# Patient Record
Sex: Female | Born: 1985
Health system: Southern US, Community
[De-identification: ages and names within clinical notes are randomized; demographics above are authoritative.]

## PROBLEM LIST (undated history)

## (undated) ENCOUNTER — Ambulatory Visit: Source: Home / Self Care

## (undated) ENCOUNTER — Ambulatory Visit (HOSPITAL_COMMUNITY): Source: Home / Self Care

## (undated) ENCOUNTER — Emergency Department (HOSPITAL_BASED_OUTPATIENT_CLINIC_OR_DEPARTMENT_OTHER): Admission: EM

## (undated) DIAGNOSIS — M24669 Ankylosis, unspecified knee: Secondary | ICD-10-CM

## (undated) DIAGNOSIS — S83209A Unspecified tear of unspecified meniscus, current injury, unspecified knee, initial encounter: Secondary | ICD-10-CM

## (undated) DIAGNOSIS — O24419 Gestational diabetes mellitus in pregnancy, unspecified control: Secondary | ICD-10-CM

## (undated) DIAGNOSIS — O139 Gestational [pregnancy-induced] hypertension without significant proteinuria, unspecified trimester: Secondary | ICD-10-CM

## (undated) DIAGNOSIS — Z789 Other specified health status: Secondary | ICD-10-CM

## (undated) HISTORY — DX: Gestational diabetes mellitus in pregnancy, unspecified control: O24.419

---

## 2015-08-13 ENCOUNTER — Emergency Department (HOSPITAL_COMMUNITY): Payer: Medicaid Other

## 2015-08-13 ENCOUNTER — Emergency Department (HOSPITAL_COMMUNITY)
Admission: EM | Admit: 2015-08-13 | Discharge: 2015-08-13 | Disposition: A | Discharge status: Home / Self Care | Payer: Medicaid Other | Attending: Emergency Medicine | Admitting: Emergency Medicine

## 2015-08-13 ENCOUNTER — Encounter (HOSPITAL_COMMUNITY): Payer: Self-pay | Admitting: *Deleted

## 2015-08-13 DIAGNOSIS — S86911A Strain of unspecified muscle(s) and tendon(s) at lower leg level, right leg, initial encounter: Secondary | ICD-10-CM | POA: Insufficient documentation

## 2015-08-13 DIAGNOSIS — W1830XA Fall on same level, unspecified, initial encounter: Secondary | ICD-10-CM | POA: Diagnosis not present

## 2015-08-13 DIAGNOSIS — S8991XA Unspecified injury of right lower leg, initial encounter: Secondary | ICD-10-CM | POA: Insufficient documentation

## 2015-08-13 DIAGNOSIS — Y999 Unspecified external cause status: Secondary | ICD-10-CM | POA: Insufficient documentation

## 2015-08-13 DIAGNOSIS — S8391XA Sprain of unspecified site of right knee, initial encounter: Secondary | ICD-10-CM

## 2015-08-13 DIAGNOSIS — Y929 Unspecified place or not applicable: Secondary | ICD-10-CM | POA: Insufficient documentation

## 2015-08-13 DIAGNOSIS — Y9372 Activity, wrestling: Secondary | ICD-10-CM | POA: Insufficient documentation

## 2015-08-13 MED ORDER — OXYCODONE-ACETAMINOPHEN 5-325 MG PO TABS
1.0000 | ORAL_TABLET | Freq: Once | ORAL | Status: AC
  Administered 2015-08-13: 1 via ORAL
  Filled 2015-08-13: qty 1

## 2015-08-13 MED ORDER — TRAMADOL HCL 50 MG PO TABS: 50.0000 mg | ORAL_TABLET | Freq: Four times a day (QID) | ORAL | Status: DC | PRN

## 2015-08-13 NOTE — ED Notes (Signed)
Patient in wheelchair, says that she cannot bend leg to get into bed.

## 2015-08-13 NOTE — ED Provider Notes (Signed)
CSN: [id number]     Arrival date & time 08/13/15  0203 History  By signing my name below, I, Colleen Wells, attest that this documentation has been prepared under the direction and in the presence of Gilda Creasehristopher J Pollina, MD. Electronically Signed: Angelene GiovanniEmmanuella Wells, ED Scribe. 08/13/2015. 2:25 AM.      Chief Complaint  Patient presents with  . Leg Injury   Patient is a 30 y.o. female presenting with knee pain. The history is provided by the patient. No language interpreter was used.  Knee Pain Location:  Knee Injury: yes   Mechanism of injury: fall   Fall:    Fall occurred:  Recreating/playing   Point of impact:  Knees   Entrapped after fall: no   Knee location:  R knee Pain details:    Radiates to:  Does not radiate   Severity:  Moderate   Onset quality:  Gradual   Timing:  Constant   Progression:  Worsening Chronicity:  New Foreign body present:  No foreign bodies Relieved by:  None tried Worsened by:  Nothing tried Ineffective treatments:  None tried Associated symptoms: decreased ROM   Associated symptoms: no fever, no muscle weakness, no numbness, no swelling and no tingling    HPI Comments: Colleen Wells is a 30 y.o. female who presents to the Emergency Department complaining of gradually worsening right knee pain s/p fall that occurred PTA. She explains that she was playing wrestling when she fell on her right knee. No LOC or head injuries. She reports associated difficulty ambulating secondary to pain. No alleviating factors noted. Pt has not tried any medications PTA. She denies any other injuries. No fever, numbness, weakness, or swelling to the knee.    History reviewed. No pertinent past medical history. Past Surgical History  Procedure Laterality Date  . Cesarean section     No family history on file. Social History  Substance Use Topics  . Smoking status: Never Smoker   . Smokeless tobacco: None  . Alcohol Use: No   OB History    No data available      Review of Systems  Constitutional: Negative for fever.  Musculoskeletal: Positive for arthralgias. Negative for joint swelling.  Neurological: Negative for weakness and numbness.  All other systems reviewed and are negative.     Allergies  Review of patient's allergies indicates no known allergies.  Home Medications   Prior to Admission medications   Not on File   BP 125/77 mmHg  Pulse 103  Temp(Src) 98.8 F (37.1 C) (Oral)  Resp 18  Ht 5\' 8"  (1.727 m)  Wt 180 lb (81.647 kg)  BMI 27.38 kg/m2  SpO2 99%  LMP 04/15/2015 Physical Exam  Constitutional: She is oriented to person, place, and time. She appears well-developed and well-nourished. No distress.  HENT:  Head: Normocephalic and atraumatic.  Right Ear: Hearing normal.  Left Ear: Hearing normal.  Nose: Nose normal.  Mouth/Throat: Oropharynx is clear and moist and mucous membranes are normal.  Eyes: Conjunctivae and EOM are normal. Pupils are equal, round, and reactive to light.  Neck: Normal range of motion. Neck supple.  Cardiovascular: Regular rhythm, S1 normal and S2 normal.  Exam reveals no gallop and no friction rub.   No murmur heard. Pulmonary/Chest: Effort normal and breath sounds normal. No respiratory distress. She exhibits no tenderness.  Abdominal: Soft. Normal appearance and bowel sounds are normal. There is no hepatosplenomegaly. There is no tenderness. There is no rebound, no guarding, no tenderness at  McBurney's point and negative Murphy's sign. No hernia.  Musculoskeletal: She exhibits tenderness.  Tenderness proximal tibia, no swelling, no joint effusion, no deformity. Decreased ROM secondary to pain  Neurological: She is alert and oriented to person, place, and time. She has normal strength. No cranial nerve deficit or sensory deficit. Coordination normal. GCS eye subscore is 4. GCS verbal subscore is 5. GCS motor subscore is 6.  Skin: Skin is warm, dry and intact. No rash noted. No cyanosis.   Psychiatric: She has a normal mood and affect. Her speech is normal and behavior is normal. Thought content normal.  Nursing note and vitals reviewed.   ED Course  Procedures (including critical care time) DIAGNOSTIC STUDIES: Oxygen Saturation is 99% on RA, normal by my interpretation.    COORDINATION OF CARE: 2:24 AM- Pt advised of plan for treatment and pt agrees.    Labs Review Labs Reviewed - No data to display  Imaging Review No results found.   Gilda Crease, MD has personally reviewed and evaluated these images and lab results as part of his medical decision-making.   EKG Interpretation None      MDM   Final diagnoses:  None  knee injury   Presents to the ER with complaints of knee injury. Patient reports that she fell, landing directly on her right knee. She indicates pain just below the kneecap. X-ray was negative. Cannot perform ligamentous examination because of painful inhibition. Will place an knee immobilizer, administer analgesia and have follow-up with orthopedics.  I personally performed the services described in this documentation, which was scribed in my presence. The recorded information has been reviewed and is accurate.    Gilda Crease, MD 08/13/15 [phone]

## 2015-08-13 NOTE — ED Notes (Signed)
Pt c/o right leg/knee pain since falling on it while play wrestling. Pt states that she cannot put weight on her right leg.

## 2015-08-13 NOTE — Discharge Instructions (Signed)

## 2015-08-16 ENCOUNTER — Other Ambulatory Visit: Payer: Self-pay | Admitting: Orthopedic Surgery

## 2015-08-16 DIAGNOSIS — M25561 Pain in right knee: Secondary | ICD-10-CM

## 2015-08-24 ENCOUNTER — Ambulatory Visit
Admission: RE | Admit: 2015-08-24 | Discharge: 2015-08-24 | Disposition: A | Discharge status: Home / Self Care | Payer: Medicaid Other | Source: Ambulatory Visit | Attending: Orthopedic Surgery | Admitting: Orthopedic Surgery

## 2015-08-24 DIAGNOSIS — M25561 Pain in right knee: Secondary | ICD-10-CM

## 2015-08-28 ENCOUNTER — Other Ambulatory Visit: Payer: Self-pay | Admitting: Orthopedic Surgery

## 2015-09-02 ENCOUNTER — Encounter (HOSPITAL_COMMUNITY): Payer: Self-pay | Admitting: *Deleted

## 2015-09-02 MED ORDER — CHLORHEXIDINE GLUCONATE 4 % EX LIQD: 60.0000 mL | Freq: Once | CUTANEOUS | Status: DC

## 2015-09-02 MED ORDER — CEFAZOLIN SODIUM-DEXTROSE 2-4 GM/100ML-% IV SOLN
2.0000 g | INTRAVENOUS | Status: AC
  Administered 2015-09-03: 2 g via INTRAVENOUS
  Filled 2015-09-02: qty 100

## 2015-09-03 ENCOUNTER — Encounter (HOSPITAL_COMMUNITY)
Admission: RE | Disposition: A | Discharge status: Home / Self Care | Payer: Self-pay | Source: Ambulatory Visit | Attending: Orthopedic Surgery

## 2015-09-03 ENCOUNTER — Ambulatory Visit (HOSPITAL_COMMUNITY)
Admission: RE | Admit: 2015-09-03 | Discharge: 2015-09-03 | Disposition: A | Discharge status: Home / Self Care | Payer: Medicaid Other | Source: Ambulatory Visit | Attending: Orthopedic Surgery | Admitting: Orthopedic Surgery

## 2015-09-03 ENCOUNTER — Encounter (HOSPITAL_COMMUNITY): Payer: Self-pay | Admitting: *Deleted

## 2015-09-03 ENCOUNTER — Ambulatory Visit (HOSPITAL_COMMUNITY): Payer: Medicaid Other | Admitting: Certified Registered"

## 2015-09-03 DIAGNOSIS — S83251A Bucket-handle tear of lateral meniscus, current injury, right knee, initial encounter: Secondary | ICD-10-CM | POA: Insufficient documentation

## 2015-09-03 DIAGNOSIS — Y9372 Activity, wrestling: Secondary | ICD-10-CM | POA: Diagnosis not present

## 2015-09-03 DIAGNOSIS — Y929 Unspecified place or not applicable: Secondary | ICD-10-CM | POA: Insufficient documentation

## 2015-09-03 DIAGNOSIS — S83511A Sprain of anterior cruciate ligament of right knee, initial encounter: Secondary | ICD-10-CM | POA: Insufficient documentation

## 2015-09-03 DIAGNOSIS — M2550 Pain in unspecified joint: Secondary | ICD-10-CM | POA: Diagnosis not present

## 2015-09-03 DIAGNOSIS — M23200 Derangement of unspecified lateral meniscus due to old tear or injury, right knee: Secondary | ICD-10-CM | POA: Diagnosis present

## 2015-09-03 HISTORY — DX: Other specified health status: Z78.9

## 2015-09-03 HISTORY — PX: MENISCUS REPAIR: SHX5179

## 2015-09-03 HISTORY — DX: Unspecified tear of unspecified meniscus, current injury, unspecified knee, initial encounter: S83.209A

## 2015-09-03 HISTORY — PX: ANTERIOR CRUCIATE LIGAMENT REPAIR: SHX115

## 2015-09-03 LAB — CBC
HCT: 39.5 % (ref 36.0–46.0)
HEMOGLOBIN: 13.2 g/dL (ref 12.0–15.0)
MCH: 28.1 pg (ref 26.0–34.0)
MCHC: 33.4 g/dL (ref 30.0–36.0)
MCV: 84 fL (ref 78.0–100.0)
PLATELETS: 294 10*3/uL (ref 150–400)
RBC: 4.7 MIL/uL (ref 3.87–5.11)
RDW: 12.5 % (ref 11.5–15.5)
WBC: 9.6 10*3/uL (ref 4.0–10.5)

## 2015-09-03 LAB — HCG, SERUM, QUALITATIVE: PREG SERUM: NEGATIVE

## 2015-09-03 SURGERY — RECONSTRUCTION, KNEE, ACL, USING HAMSTRING GRAFT
Anesthesia: Regional | Site: Knee | Laterality: Right

## 2015-09-03 MED ORDER — FENTANYL CITRATE (PF) 100 MCG/2ML IJ SOLN
INTRAMUSCULAR | Status: AC
  Administered 2015-09-03: 75 ug via INTRAVENOUS
  Filled 2015-09-03: qty 2

## 2015-09-03 MED ORDER — OXYCODONE HCL 5 MG/5ML PO SOLN: 5.0000 mg | Freq: Once | ORAL | Status: DC | PRN

## 2015-09-03 MED ORDER — METHOCARBAMOL 500 MG PO TABS: 500.0000 mg | ORAL_TABLET | Freq: Four times a day (QID) | ORAL | Status: DC

## 2015-09-03 MED ORDER — SODIUM CHLORIDE 0.9 % IR SOLN
Status: DC | PRN
  Administered 2015-09-03 (×6): 3000 mL

## 2015-09-03 MED ORDER — PROPOFOL 10 MG/ML IV BOLUS
INTRAVENOUS | Status: AC
  Filled 2015-09-03: qty 20

## 2015-09-03 MED ORDER — BUPIVACAINE HCL (PF) 0.25 % IJ SOLN
INTRAMUSCULAR | Status: AC
  Filled 2015-09-03: qty 30

## 2015-09-03 MED ORDER — BUPIVACAINE-EPINEPHRINE (PF) 0.5% -1:200000 IJ SOLN
INTRAMUSCULAR | Status: DC | PRN
  Administered 2015-09-03: 20 mL via PERINEURAL

## 2015-09-03 MED ORDER — PROPOFOL 10 MG/ML IV BOLUS
INTRAVENOUS | Status: DC | PRN
  Administered 2015-09-03: 200 mg via INTRAVENOUS
  Administered 2015-09-03: 20 mg via INTRAVENOUS

## 2015-09-03 MED ORDER — LACTATED RINGERS IV SOLN
INTRAVENOUS | Status: DC | PRN
  Administered 2015-09-03 (×2): via INTRAVENOUS

## 2015-09-03 MED ORDER — FENTANYL CITRATE (PF) 250 MCG/5ML IJ SOLN
INTRAMUSCULAR | Status: AC
  Filled 2015-09-03: qty 5

## 2015-09-03 MED ORDER — MORPHINE SULFATE (PF) 4 MG/ML IV SOLN
INTRAVENOUS | Status: DC | PRN
  Administered 2015-09-03: 8 mg via INTRAVENOUS

## 2015-09-03 MED ORDER — LIDOCAINE HCL (CARDIAC) 20 MG/ML IV SOLN
INTRAVENOUS | Status: DC | PRN
  Administered 2015-09-03: 80 mg via INTRAVENOUS

## 2015-09-03 MED ORDER — MIDAZOLAM HCL 2 MG/2ML IJ SOLN
2.0000 mg | Freq: Once | INTRAMUSCULAR | Status: AC
Start: 2015-09-03 — End: 2015-09-03
  Administered 2015-09-03: 2 mg via INTRAVENOUS

## 2015-09-03 MED ORDER — ONDANSETRON HCL 4 MG/2ML IJ SOLN
INTRAMUSCULAR | Status: DC | PRN
Start: 2015-09-03 — End: 2015-09-03
  Administered 2015-09-03: 4 mg via INTRAVENOUS

## 2015-09-03 MED ORDER — MIDAZOLAM HCL 2 MG/2ML IJ SOLN
INTRAMUSCULAR | Status: AC
  Administered 2015-09-03: 2 mg via INTRAVENOUS
  Filled 2015-09-03: qty 2

## 2015-09-03 MED ORDER — OXYCODONE HCL 5 MG PO TABS: 5.0000 mg | ORAL_TABLET | Freq: Once | ORAL | Status: DC | PRN

## 2015-09-03 MED ORDER — ASPIRIN EC 325 MG PO TBEC: 325.0000 mg | DELAYED_RELEASE_TABLET | Freq: Every day | ORAL | Status: DC

## 2015-09-03 MED ORDER — MORPHINE SULFATE (PF) 4 MG/ML IV SOLN
INTRAVENOUS | Status: AC
  Filled 2015-09-03: qty 1

## 2015-09-03 MED ORDER — FENTANYL CITRATE (PF) 100 MCG/2ML IJ SOLN
75.0000 ug | Freq: Once | INTRAMUSCULAR | Status: AC
  Administered 2015-09-03: 75 ug via INTRAVENOUS

## 2015-09-03 MED ORDER — FENTANYL CITRATE (PF) 100 MCG/2ML IJ SOLN
INTRAMUSCULAR | Status: DC | PRN
  Administered 2015-09-03: 50 ug via INTRAVENOUS
  Administered 2015-09-03: 25 ug via INTRAVENOUS
  Administered 2015-09-03 (×2): 50 ug via INTRAVENOUS
  Administered 2015-09-03 (×2): 25 ug via INTRAVENOUS
  Administered 2015-09-03: 50 ug via INTRAVENOUS
  Administered 2015-09-03 (×3): 25 ug via INTRAVENOUS

## 2015-09-03 MED ORDER — HYDROMORPHONE HCL 1 MG/ML IJ SOLN: 0.2500 mg | INTRAMUSCULAR | Status: DC | PRN

## 2015-09-03 MED ORDER — PROMETHAZINE HCL 25 MG/ML IJ SOLN: 6.2500 mg | INTRAMUSCULAR | Status: DC | PRN

## 2015-09-03 MED ORDER — ONDANSETRON HCL 4 MG/2ML IJ SOLN
INTRAMUSCULAR | Status: AC
  Filled 2015-09-03: qty 2

## 2015-09-03 MED ORDER — OXYCODONE-ACETAMINOPHEN 5-325 MG PO TABS: 1.0000 | ORAL_TABLET | Freq: Four times a day (QID) | ORAL | Status: DC | PRN

## 2015-09-03 MED ORDER — LACTATED RINGERS IV SOLN
Freq: Once | INTRAVENOUS | Status: AC
  Administered 2015-09-03: 09:00:00 via INTRAVENOUS

## 2015-09-03 MED ORDER — BUPIVACAINE HCL (PF) 0.25 % IJ SOLN
INTRAMUSCULAR | Status: DC | PRN
Start: 2015-09-03 — End: 2015-09-03
  Administered 2015-09-03: 10 mL

## 2015-09-03 MED ORDER — DEXAMETHASONE SODIUM PHOSPHATE 10 MG/ML IJ SOLN
INTRAMUSCULAR | Status: DC | PRN
  Administered 2015-09-03: 5 mg via INTRAVENOUS

## 2015-09-03 MED ORDER — CLONIDINE HCL (ANALGESIA) 100 MCG/ML EP SOLN
EPIDURAL | Status: DC | PRN
Start: 2015-09-03 — End: 2015-09-03
  Administered 2015-09-03: 1 mL

## 2015-09-03 MED ORDER — MIDAZOLAM HCL 2 MG/2ML IJ SOLN
INTRAMUSCULAR | Status: AC
  Filled 2015-09-03: qty 2

## 2015-09-03 MED ORDER — 0.9 % SODIUM CHLORIDE (POUR BTL) OPTIME
TOPICAL | Status: DC | PRN
  Administered 2015-09-03: 1000 mL

## 2015-09-03 MED ORDER — LIDOCAINE 2% (20 MG/ML) 5 ML SYRINGE
INTRAMUSCULAR | Status: AC
  Filled 2015-09-03: qty 5

## 2015-09-03 SURGICAL SUPPLY — 92 items
ANCHOR PUSHLOCK PEEK 3.5X19.5 (Anchor) ×3 IMPLANT
BANDAGE ELASTIC 6 VELCRO ST LF (GAUZE/BANDAGES/DRESSINGS) ×3 IMPLANT
BANDAGE ESMARK 6X9 LF (GAUZE/BANDAGES/DRESSINGS) ×1 IMPLANT
BLADE CUTTER GATOR 3.5 (BLADE) ×3 IMPLANT
BLADE GREAT WHITE 4.2 (BLADE) ×2 IMPLANT
BLADE GREAT WHITE 4.2MM (BLADE) ×1
BLADE SURG 10 STRL SS (BLADE) ×3 IMPLANT
BLADE SURG 15 STRL LF DISP TIS (BLADE) ×4 IMPLANT
BLADE SURG 15 STRL SS (BLADE) ×8
BNDG ELASTIC 6X15 VLCR STRL LF (GAUZE/BANDAGES/DRESSINGS) ×3 IMPLANT
BNDG ESMARK 6X9 LF (GAUZE/BANDAGES/DRESSINGS) ×3
BUR OVAL 6.0 (BURR) ×3 IMPLANT
CANNULA 5.75X7 CRYSTAL CLEAR (CANNULA) ×3 IMPLANT
CANNULA 5.75X71 LONG (CANNULA) ×3 IMPLANT
CLOSURE WOUND 1/2 X4 (GAUZE/BANDAGES/DRESSINGS) ×1
COVER MAYO STAND STRL (DRAPES) IMPLANT
COVER SURGICAL LIGHT HANDLE (MISCELLANEOUS) ×3 IMPLANT
CUFF TOURNIQUET SINGLE 34IN LL (TOURNIQUET CUFF) ×3 IMPLANT
DECANTER SPIKE VIAL GLASS SM (MISCELLANEOUS) ×3 IMPLANT
DRAPE ARTHROSCOPY W/POUCH 114 (DRAPES) ×3 IMPLANT
DRAPE INCISE IOBAN 66X45 STRL (DRAPES) ×3 IMPLANT
DRAPE U-SHAPE 47X51 STRL (DRAPES) ×3 IMPLANT
DRILL FLIPCUTTER II 7.5MM (MISCELLANEOUS) IMPLANT
DRILL FLIPCUTTER II 8.0MM (INSTRUMENTS) IMPLANT
DRILL FLIPCUTTER II 8.5MM (INSTRUMENTS) IMPLANT
DRILL FLIPCUTTER II 9.0MM (INSTRUMENTS) IMPLANT
DRSG PAD ABDOMINAL 8X10 ST (GAUZE/BANDAGES/DRESSINGS) ×3 IMPLANT
DRSG TEGADERM 2-3/8X2-3/4 SM (GAUZE/BANDAGES/DRESSINGS) ×3 IMPLANT
DRSG TEGADERM 4X4.75 (GAUZE/BANDAGES/DRESSINGS) ×9 IMPLANT
DURAPREP 26ML APPLICATOR (WOUND CARE) ×6 IMPLANT
ELECT REM PT RETURN 9FT ADLT (ELECTROSURGICAL) ×3
ELECTRODE REM PT RTRN 9FT ADLT (ELECTROSURGICAL) ×1 IMPLANT
FIBERSTICK 2 (SUTURE) ×6 IMPLANT
FLIPCUTTER II 7.5MM (MISCELLANEOUS)
FLIPCUTTER II 8.0MM (INSTRUMENTS)
FLIPCUTTER II 8.5MM (INSTRUMENTS)
FLIPCUTTER II 9.0MM (INSTRUMENTS)
FLUID NSS /IRRIG 3000 ML XXX (IV SOLUTION) ×18 IMPLANT
GAUZE SPONGE 4X4 12PLY STRL (GAUZE/BANDAGES/DRESSINGS) ×3 IMPLANT
GAUZE XEROFORM 1X8 LF (GAUZE/BANDAGES/DRESSINGS) ×3 IMPLANT
GLOVE BIOGEL PI IND STRL 8 (GLOVE) ×2 IMPLANT
GLOVE BIOGEL PI INDICATOR 8 (GLOVE) ×4
GLOVE ORTHO TXT STRL SZ7.5 (GLOVE) ×3 IMPLANT
GLOVE SURG ORTHO 8.0 STRL STRW (GLOVE) ×3 IMPLANT
GOWN SPEC L3 XXLG W/TWL (GOWN DISPOSABLE) ×3 IMPLANT
GOWN STRL REUS W/ TWL LRG LVL3 (GOWN DISPOSABLE) ×2 IMPLANT
GOWN STRL REUS W/TWL LRG LVL3 (GOWN DISPOSABLE) ×4
IMMOBILIZER KNEE 22 UNIV (SOFTGOODS) ×3 IMPLANT
KIT BASIN OR (CUSTOM PROCEDURE TRAY) ×3 IMPLANT
KIT BIOCARTILAGE DEL W/SYRINGE (KITS) IMPLANT
KIT ROOM TURNOVER OR (KITS) ×3 IMPLANT
MANIFOLD NEPTUNE II (INSTRUMENTS) ×3 IMPLANT
NEEDLE 18GX1X1/2 (RX/OR ONLY) (NEEDLE) ×3 IMPLANT
NEEDLE SCORPION MULTI FIRE (NEEDLE) ×3 IMPLANT
NS IRRIG 1000ML POUR BTL (IV SOLUTION) ×3 IMPLANT
PACK ARTHROSCOPY DSU (CUSTOM PROCEDURE TRAY) ×3 IMPLANT
PAD ARMBOARD 7.5X6 YLW CONV (MISCELLANEOUS) ×6 IMPLANT
PAD CAST 4YDX4 CTTN HI CHSV (CAST SUPPLIES) ×1 IMPLANT
PADDING CAST ABS 6INX4YD NS (CAST SUPPLIES) ×2
PADDING CAST ABS COTTON 6X4 NS (CAST SUPPLIES) ×1 IMPLANT
PADDING CAST COTTON 4X4 STRL (CAST SUPPLIES) ×2
PADDING CAST COTTON 6X4 STRL (CAST SUPPLIES) ×9 IMPLANT
PENCIL BUTTON HOLSTER BLD 10FT (ELECTRODE) ×3 IMPLANT
SET ARTHROSCOPY TUBING (MISCELLANEOUS) ×2
SET ARTHROSCOPY TUBING LN (MISCELLANEOUS) ×1 IMPLANT
SPONGE GAUZE 4X4 12PLY STER LF (GAUZE/BANDAGES/DRESSINGS) ×3 IMPLANT
SPONGE LAP 4X18 X RAY DECT (DISPOSABLE) ×6 IMPLANT
SPONGE SCRUB IODOPHOR (GAUZE/BANDAGES/DRESSINGS) ×3 IMPLANT
STRIP CLOSURE SKIN 1/2X4 (GAUZE/BANDAGES/DRESSINGS) ×2 IMPLANT
SUCTION FRAZIER HANDLE 10FR (MISCELLANEOUS) ×2
SUCTION TUBE FRAZIER 10FR DISP (MISCELLANEOUS) ×1 IMPLANT
SUT ETHILON 3 0 PS 1 (SUTURE) ×9 IMPLANT
SUT FIBERWIRE #2 38 REV NDL BL (SUTURE) ×6
SUT FIBERWIRE 2-0 18 17.9 3/8 (SUTURE) ×15
SUT MENISCAL KIT (KITS) ×3 IMPLANT
SUT MNCRL AB 3-0 PS2 18 (SUTURE) ×6 IMPLANT
SUT VIC AB 0 CT1 27 (SUTURE) ×4
SUT VIC AB 0 CT1 27XBRD ANBCTR (SUTURE) ×2 IMPLANT
SUT VIC AB 2-0 CT1 27 (SUTURE)
SUT VIC AB 2-0 CT1 TAPERPNT 27 (SUTURE) IMPLANT
SUT VICRYL 0 UR6 27IN ABS (SUTURE) ×6 IMPLANT
SUTURE FIBERWR 2-0 18 17.9 3/8 (SUTURE) ×5 IMPLANT
SUTURE FIBERWR#2 38 REV NDL BL (SUTURE) ×2 IMPLANT
SYR 30ML LL (SYRINGE) ×3 IMPLANT
SYR BULB IRRIGATION 50ML (SYRINGE) ×3 IMPLANT
SYR TB 1ML LUER SLIP (SYRINGE) ×3 IMPLANT
TOWEL OR 17X24 6PK STRL BLUE (TOWEL DISPOSABLE) ×3 IMPLANT
TOWEL OR 17X26 10 PK STRL BLUE (TOWEL DISPOSABLE) ×3 IMPLANT
UNDERPAD 30X30 INCONTINENT (UNDERPADS AND DIAPERS) ×3 IMPLANT
WAND 30 DEG SABER W/CORD (SURGICAL WAND) IMPLANT
WAND HAND CNTRL MULTIVAC 90 (MISCELLANEOUS) ×3 IMPLANT
WRAP KNEE MAXI GEL POST OP (GAUZE/BANDAGES/DRESSINGS) ×3 IMPLANT

## 2015-09-03 NOTE — Anesthesia Procedure Notes (Addendum)
Anesthesia Regional Block:  Femoral nerve block  Pre-Anesthetic Checklist: ,, timeout performed, Correct Patient, Correct Site, Correct Laterality, Correct Procedure, Correct Position, site marked, Risks and benefits discussed,  Surgical consent,  Pre-op evaluation,  At surgeon's request and post-op pain management  Laterality: Right  Prep: Maximum Sterile Barrier Precautions used and chloraprep       Needles:  Injection technique: Single-shot  Needle Type: Stimiplex     Needle Length: 9cm 9 cm Needle Gauge: 21 and 21 G    Additional Needles:  Procedures: ultrasound guided (picture in chart) Femoral nerve block Narrative:  Injection made incrementally with aspirations every 5 mL.  Performed by: Personally  Anesthesiologist: JUDD, BENJAMIN  Additional Notes: Patient tolerated the procedure well without complications. No parasthesia.    Procedure Name: LMA Insertion Performed by: Fransisca KaufmannMEYER, Garner Dullea E Pre-anesthesia Checklist: Patient identified, Emergency Drugs available, Suction available and Patient being monitored Patient Re-evaluated:Patient Re-evaluated prior to inductionOxygen Delivery Method: Circle System Utilized Preoxygenation: Pre-oxygenation with 100% oxygen Intubation Type: IV induction Ventilation: Mask ventilation without difficulty LMA: LMA inserted LMA Size: 4.0 Number of attempts: 1 Airway Equipment and Method: Bite block Placement Confirmation: positive ETCO2 and breath sounds checked- equal and bilateral Tube secured with: Tape Dental Injury: Teeth and Oropharynx as per pre-operative assessment

## 2015-09-03 NOTE — Progress Notes (Signed)
Orthopedic Tech Progress Note Patient Details:  Temple PaciniShguna Monsivais 03-13-1985 [id number]  Ortho Devices Ortho Device/Splint Location: footsie roll Ortho Device/Splint Interventions: Ordered, Application   Jennye MoccasinHughes, May Manrique Craig 09/03/2015, 5:56 PM

## 2015-09-03 NOTE — Brief Op Note (Signed)
09/03/2015  4:37 PM  PATIENT:  Colleen Wells  30 y.o. female  PRE-OPERATIVE DIAGNOSIS:  RIGHT KNEE ANTERIOR CRUCIATE LIGAMENT AVULSION,  LATERAL MENISCAL TEARS  POST-OPERATIVE DIAGNOSIS:  RIGHT KNEE ANTERIOR CRUCIATE LIGAMENT avulsion, lateral bucket handle meniscal tear  PROCEDURE:  Procedure(s): REPAIR ANTERIOR CRUCIATE LIGAMENT  REPAIR OF AVULSION REPAIR OF MENISCUS LATERAL  SURGEON:  Surgeon(s): Cammy CopaScott Kyros Salzwedel, MD  ASSISTANT: April Green RNFA  ANESTHESIA:   general  EBL: 15 ml    Total I/O In: 1800 [I.V.:1800] Out: 25 [Blood:25]  BLOOD ADMINISTERED: none  DRAINS: none   LOCAL MEDICATIONS USED:    SPECIMEN:  No Specimen  COUNTS:  YES  TOURNIQUET:   Total Tourniquet Time Documented: Thigh (Right) - 112 minutes Total: Thigh (Right) - 112 minutes   DICTATION: .Other Dictation: Dictation Number done  PLAN OF CARE: Discharge to home after PACU  PATIENT DISPOSITION:  PACU - hemodynamically stable

## 2015-09-03 NOTE — Transfer of Care (Signed)
Immediate Anesthesia Transfer of Care Note  Patient: Colleen Wells  Procedure(s) Performed: Procedure(s): REPAIR ANTERIOR CRUCIATE LIGAMENT  REPAIR OF AVULSION (Right) REPAIR OF MENISCUS LATERAL (Right)  Patient Location: PACU  Anesthesia Type:General  Level of Consciousness: awake, alert , oriented and patient cooperative  Airway & Oxygen Therapy: Patient Spontanous Breathing  Post-op Assessment: Report given to RN and Post -op Vital signs reviewed and stable  Post vital signs: Reviewed and stable  Last Vitals:  Filed Vitals:   09/03/15 0955 09/03/15 1000  BP: 131/63 123/67  Pulse: 89 91  Temp:    Resp: 20 18    Last Pain:  Filed Vitals:   09/03/15 1002  PainSc: 0-No pain         Complications: No apparent anesthesia complications

## 2015-09-03 NOTE — Anesthesia Preprocedure Evaluation (Signed)
Anesthesia Evaluation  Patient identified by MRN, date of birth, ID band Patient awake    Reviewed: Allergy & Precautions, H&P , NPO status , Patient's Chart, lab work & pertinent test results  History of Anesthesia Complications Negative for: history of anesthetic complications  Airway Mallampati: II  TM Distance: >3 FB Neck ROM: full    Dental no notable dental hx.    Pulmonary neg pulmonary ROS,    Pulmonary exam normal breath sounds clear to auscultation       Cardiovascular negative cardio ROS Normal cardiovascular exam Rhythm:regular Rate:Normal     Neuro/Psych negative neurological ROS     GI/Hepatic negative GI ROS, Neg liver ROS,   Endo/Other  negative endocrine ROS  Renal/GU negative Renal ROS     Musculoskeletal   Abdominal   Peds  Hematology negative hematology ROS (+)   Anesthesia Other Findings   Reproductive/Obstetrics negative OB ROS                             Anesthesia Physical Anesthesia Plan  ASA: II  Anesthesia Plan: General and Regional   Post-op Pain Management: GA combined w/ Regional for post-op pain   Induction: Intravenous  Airway Management Planned: LMA  Additional Equipment:   Intra-op Plan:   Post-operative Plan:   Informed Consent: I have reviewed the patients History and Physical, chart, labs and discussed the procedure including the risks, benefits and alternatives for the proposed anesthesia with the patient or authorized representative who has indicated his/her understanding and acceptance.   Dental Advisory Given  Plan Discussed with: Anesthesiologist, CRNA and Surgeon  Anesthesia Plan Comments:         Anesthesia Quick Evaluation

## 2015-09-03 NOTE — Anesthesia Postprocedure Evaluation (Signed)
Anesthesia Post Note  Patient: Colleen Wells  Procedure(s) Performed: Procedure(s) (LRB): REPAIR ANTERIOR CRUCIATE LIGAMENT  REPAIR OF AVULSION (Right) REPAIR OF MENISCUS LATERAL (Right)  Patient location during evaluation: PACU Anesthesia Type: General and Regional Level of consciousness: awake and alert Pain management: pain level controlled Vital Signs Assessment: post-procedure vital signs reviewed and stable Respiratory status: spontaneous breathing, nonlabored ventilation, respiratory function stable and patient connected to nasal cannula oxygen Cardiovascular status: blood pressure returned to baseline and stable Postop Assessment: no signs of nausea or vomiting Anesthetic complications: no    Last Vitals:  Filed Vitals:   09/03/15 1715 09/03/15 1718  BP:  122/72  Pulse: 77 78  Temp:    Resp: 14 14    Last Pain:  Filed Vitals:   09/03/15 1729  PainSc: Asleep                 Reino KentJudd, Rilie Glanz J

## 2015-09-03 NOTE — H&P (Signed)
Colleen Wells is an 30 y.o. female.   Chief Complaint: Right knee pain and locking knee HPI: Colleen Wells is a 30 year old female who was wrestling approximately 3 weeks ago when Colleen Wells sustained a right knee injury.  Colleen Wells describes inability to fully extend the knee.  Colleen Wells denies any other orthopedic complaints.  No family history of DVT or pulmonary embolism  Past Medical History  Diagnosis Date  . Meniscus tear   . Medical history non-contributory     Past Surgical History  Procedure Laterality Date  . Cesarean section      History reviewed. No pertinent family history. Social History:  reports that Colleen Wells has never smoked. Colleen Wells has never used smokeless tobacco. Colleen Wells reports that Colleen Wells does not drink alcohol or use illicit drugs.  Allergies: No Known Allergies  No prescriptions prior to admission    No results found for this or any previous visit (from the past 48 hour(s)). No results found.  Review of Systems  Constitutional: Negative.   HENT: Negative.   Respiratory: Negative.   Cardiovascular: Negative.   Gastrointestinal: Negative.   Genitourinary: Negative.   Musculoskeletal: Positive for joint pain.  Skin: Negative.   Neurological: Negative.   Endo/Heme/Allergies: Negative.   Psychiatric/Behavioral: Negative.     Height 5\' 8"  (1.727 m), weight 83.462 kg (184 lb), last menstrual period 04/15/2015. Physical Exam  Constitutional: Colleen Wells appears well-developed.  HENT:  Head: Normocephalic.  Eyes: Pupils are equal, round, and reactive to light.  Neck: Normal range of motion.  Cardiovascular: Normal rate.   Respiratory: Effort normal.  Neurological: Colleen Wells is alert.  Skin: Skin is warm.  Psychiatric: Colleen Wells has a normal mood and affect.   examination the right knee demonstrates lack of full extension by about 20.  Painful on the medial and lateral joint line.  Anterior cruciate ligament laxity is present.  No posterior lateral rotatory instability is present.  Pedal pulses palpable ankle  dorsi and plantar flexion intact  Assessment/Plan Impression anterior cruciate ligament avulsion fracture with both medial and lateral meniscal tears.  Lateral meniscal tear is bucket-handle tear displaced anteriorly which is blocking full extension.  Plan is arthroscopy with attempt at anterior cruciate ligament avulsion fracture repair with meniscal repair inside out performed as well.  If that avulsion fracture of the tibial spine cannot be repaired and anterior cruciate ligament reconstruction will be performed with hamstring autograft.  Risks and benefits of surgical procedure discussed with the patient including not limited to infection or vessel damage potential need for more surgery as well as the stiffness as well as residual laxity.  In regards to the meniscal repair and potential for numbness as well as a nerve vessel damage discussed all questions answered  Cammy CopaEAN,GREGORY SCOTT, MD 09/03/2015, 7:20 AM

## 2015-09-04 ENCOUNTER — Encounter (HOSPITAL_COMMUNITY): Payer: Self-pay | Admitting: Orthopedic Surgery

## 2015-09-04 NOTE — Op Note (Signed)
NAMTemple Wells:  Colleen Wells, Colleen Wells            ACCOUNT NO.:  [id number]  MEDICAL RECORD NO.:  [id number]  LOCATION:  [facility]                         FACILITY:  [facility]  PHYSICIAN:  Burnard BuntingG. Scott Dean, M.D.    DATE OF BIRTH:  Apr 28, 1985  DATE OF PROCEDURE: DATE OF DISCHARGE:  09/03/2015                              OPERATIVE REPORT   PREOPERATIVE DIAGNOSES:  Right knee anterior cruciate ligament avulsion fracture with medial and lateral meniscal tear, lateral meniscal tear being bucket handle type.  POSTOPERATIVE DIAGNOSES:  Right knee anterior cruciate ligament avulsion with intact anterior cruciate ligament avulsion off on the tibial plateau with some bone fragments and displaced bucket-handle tear of the lateral meniscus.  Medial meniscus intact.  PROCEDURE:  Right knee arthroscopy, ACL avulsion repair, and inside-out lateral meniscal repair, bucket-handle type using 7 alternating vertical mattress and horizontal mattress sutures.  SURGEON:  Burnard BuntingG. Scott Dean, M.D.  ASSISTANT:  April Chilton SiGreen, RNFA.  ANESTHESIA:  General.  INDICATIONS:  Colleen Wells is a patient who injured her knee during wrestling. She presents now for operative management after explanation risks benefits.  OPERATIVE FINDINGS: 1. Examination under anesthesia, range of motion.  The patient lacked     about 10 degrees of full extension, but did have full extension at     the conclusion of the case.  She had full flexion, good stability     to varus valgus stress at 0 and 30 degrees.  There was no     posterolateral rotatory instability noted.  She did have ACL     laxity. 2. Diagnostic and operative arthroscopy.     a.     Intact patellofemoral compartment.     b.     Bucket-handle tear of the lateral meniscus flipped into the      anterior joint.     c.     ACL avulsion fracture with comminution fracture of the      tibial spine and base.     d.     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     e.      Intact medial meniscus and articular cartilage.  PROCEDURE IN DETAIL:  The patient was brought to the operating room, where general anesthetic was induced.  Preoperative antibiotics were administered.  Time-out was called.  The patient was placed in a leg holder and the right leg was examined under anesthesia, and then prescrubbed with alcohol and Betadine which was allowed to air dry. Prepped with DuraPrep solution and draped in sterile manner.Colleen Wells.  Ioban was used to cover the operative field.  Time-out called.  Tourniquet elevated for a total about 150 minutes.  Anterior, inferior, and lateral port was created carefully.  Anterior, inferior, and medial port was created under direct visualization.  Diagnostic arthroscopy was performed.  Debridement was performed in order to achieve visualization anteriorly. The patient did have an ACL avulsion fracture.  The piece was flipped up into the joint.  She also had a very difficult to reduce bucket-handle tear of the lateral meniscus.  This extended about 2-3 cm from the mid portion extending back towards the posterior horn.  The medial meniscus  was intact.  Definite extensive probing demonstrated no tear that reached the inferior or superior articular surface.  The PCL was intact.  At this time, accessory transpatellar tendon portal was created.  Debridement was performed on the base of the ACL avulsion. Using an accessory mid patellar medial portal and the scorpion #2 FiberWire sutures were placed in mattress fashion x2 through the stump of the ACL, and guide pin was used to drill just slightly anterior to the equator of the crater, which was created from the ACL avulsion.  Two sutures were then passed and then tied over a bone bridge with the knee in extension.  This gave a very good restoration of ACL isometry as well as stability.  Loose bone fragments which were not incorporated were debrided.  At this time, the ACL repair was then tied over  a bone bridge with supplemental fixation with PushLock.  This gave a very secure repair, and had excellent range of motion.  Flexion and extension with good stability.  After this maneuver as well as reduction of the lateral meniscus, the knee did achieve full extension and attention was then directed towards the lateral side.  Using the arthroscopic rasp and the shaver, the meniscus was prepared along with the capsule.  Then, an exposure was made at the level of the joint line posterior laterally. Care being taken to avoid injury to the peroneal nerve in the lateral collateral ligament.  The tunnel the iliotibial band was split and dissection was performed between the lateral gastroc muscle belly extending proximally to its attachment posteriorly. Seven alternating vertical and horizontal mattress sutures were then placed using the inside-out Arthrex cannula.  This gave a very good suture fixation. Good repair was achieved.  At this time, thorough irrigation was performed and the sutures were tied posterior to anterior with the knee in about 30 degrees of extension.  This gave excellent secure repair. Tourniquet was released prior to the repair and this did not impede visualization.  The knee joint was thoroughly irrigated.  All incisions were thoroughly irrigated.  Posterolateral incision was closed using 0 Vicryl suture followed by 2-0 Vicryl and 3-0 Monocryl.  The portal incisions were closed using 2-0 Vicryl and 3-0 nylon.  Solution of Marcaine, morphine, and clonidine injected into the knee.  The knee was placed in a bulky dressing after impervious dressings were placed and a bulky dressing and ice pack and knee immobilizer.  Tolerated the procedure well without immediate complication.  Transferred to recovery room in stable condition.     Burnard Bunting, M.D.     GSD/MEDQ  D:  09/03/2015  T:  09/04/2015  Job:  [id number]

## 2015-09-18 ENCOUNTER — Ambulatory Visit: Payer: Medicaid Other | Attending: Orthopedic Surgery | Admitting: Physical Therapy

## 2015-09-18 DIAGNOSIS — M25561 Pain in right knee: Secondary | ICD-10-CM | POA: Diagnosis present

## 2015-09-18 DIAGNOSIS — R600 Localized edema: Secondary | ICD-10-CM

## 2015-09-18 DIAGNOSIS — M25661 Stiffness of right knee, not elsewhere classified: Secondary | ICD-10-CM | POA: Diagnosis present

## 2015-09-18 DIAGNOSIS — R2689 Other abnormalities of gait and mobility: Secondary | ICD-10-CM | POA: Diagnosis present

## 2015-09-18 NOTE — Therapy (Signed)
[facility] [address] [place], [place], [address] Phone: [phone]   Fax:  [phone]  Physical Therapy Evaluation  Patient Details  Name: Colleen Wells MRN: [id number] Date of Birth: [birth year] Referring Provider: Cammy Copa MD  Encounter Date: 09/18/2015      PT End of Session - 09/18/15 0858    Visit Number 1   Number of Visits 4   Date for PT Re-Evaluation 11/13/15   Authorization Type Medicaid   Authorization - Visit Number 1   Authorization - Number of Visits 4   PT Start Time 0800   PT Stop Time 0846   PT Time Calculation (min) 46 min   Activity Tolerance Patient tolerated treatment well   Behavior During Therapy [facility] for tasks assessed/performed      Past Medical History:  Diagnosis Date  . Medical history non-contributory   . Meniscus tear     Past Surgical History:  Procedure Laterality Date  . ANTERIOR CRUCIATE LIGAMENT REPAIR Right 09/03/2015   Procedure: REPAIR ANTERIOR CRUCIATE LIGAMENT  REPAIR OF AVULSION;  Surgeon: Cammy Copa, MD;  Location: [facility] OR;  Service: Orthopedics;  Laterality: Right;  . CESAREAN SECTION    . MENISCUS REPAIR Right 09/03/2015   Procedure: REPAIR OF MENISCUS LATERAL;  Surgeon: Cammy Copa, MD;  Location: [facility] OR;  Service: Orthopedics;  Laterality: Right;    There were no vitals filed for this visit.       Subjective Assessment - 09/18/15 0811    Subjective pt is a [name] s/p  R ACL reconstruction with hamstring graft on 09/03/2015. since the surgery things have been going well, still having intermittent swelling and pain requiring pain medication. pain stays in the knee with no referral.  Currenlty used use crutches using Straight leg brace.    Limitations Sitting;Lifting;Standing;Walking;House hold activities  stairs   How long can you sit comfortably? 1 hour  with leg straight   How long can you stand comfortably? 5 min   How long can you walk  comfortably? 5 min  with crutches   Diagnostic tests MRI before surgery    Patient Stated Goals to be able to stand and walk longer without pain    Currently in Pain? Yes   Pain Score 2   took pain meds this AM   Pain Location Knee   Pain Orientation Right   Pain Descriptors / Indicators Aching   Pain Type Surgical pain   Pain Onset 1 to 4 weeks ago   Pain Frequency Constant   Aggravating Factors  moving the knee around, standing/ walking    Pain Relieving Factors pain medication, ice bath            [facility] PT Assessment - 09/18/15 0816      Assessment   Medical Diagnosis S/P R ACL reconstruction  with hamstring graft   Referring Provider Cammy Copa MD   Onset Date/Surgical Date 09/03/15   Hand Dominance Right   Next MD Visit 09/18/2015   Prior Therapy no     Precautions   Precaution Comments pt reports no precautions given     Restrictions   Weight Bearing Restrictions No     Balance Screen   Has the patient fallen in the past 6 months No   Has the patient had a decrease in activity level because of a fear of falling?  No   Is the patient reluctant to leave their home because of a  fear of falling?  No     Home Nurse, mental health Private residence   Living Arrangements Spouse/significant other   Available Help at Discharge Available PRN/intermittently   Type of Home Apartment   Home Access Stairs to enter   Entrance Stairs-Number of Steps 24   Entrance Stairs-Rails Can reach both   Home Layout One level   Home Equipment Crutches     Prior Function   Level of Independence Independent;Independent with basic ADLs   Vocation Unemployed   Leisure hanging out with family/ kids, shopping     Cognition   Overall Cognitive Status Within Functional Limits for tasks assessed     Observation/Other Assessments   Focus on Therapeutic Outcomes (FOTO)  60% limited  predicted 38% limited     Observation/Other Assessments-Edema    Edema  Circumferential     Circumferential Edema   Circumferential - Right At joint line 40.2cm, 10 cm above 42cm, 10 cm below 34cm     Posture/Postural Control   Posture/Postural Control Postural limitations   Postural Limitations Rounded Shoulders;Forward head     ROM / Strength   AROM / PROM / Strength AROM;PROM;Strength     AROM   AROM Assessment Site Knee   Right/Left Knee Right;Left   Right Knee Extension 0   Right Knee Flexion 0  unable to actively flexion the knee   Left Knee Extension 0   Left Knee Flexion 130     PROM   PROM Assessment Site Knee   Right/Left Knee Right   Right Knee Extension 0   Right Knee Flexion 15  pain during motion     Strength   Overall Strength Comments R knee not tested due to pain/ precaution   Strength Assessment Site Knee   Right/Left Knee Right;Left   Left Knee Flexion 5/5   Left Knee Extension 5/5     Palpation   Palpation comment tenderness at in the medial aspect of the knee      Ambulation/Gait   Ambulation/Gait Yes   Assistive device Crutches   Gait Pattern Step-to pattern;Decreased stride length;Trendelenburg;Antalgic;Trunk flexed;Narrow base of support;Right steppage;Decreased weight shift to right;Decreased dorsiflexion - right  limited weight bearing on RLE   Gait Comments educated how to go up/down steps and it was ok to put weight through RLE with walking                           PT Education - 09/18/15 0857    Education provided Yes   Education Details evaluation findings, POC, goals, HEP with proper form and treatment rationale. gait training with crutches and navigating stairs properly   Person(s) Educated Patient;Spouse   Methods Explanation;Verbal cues;Handout;Demonstration   Comprehension Verbalized understanding;Verbal cues required          PT Short Term Goals - 09/18/15 0905      PT SHORT TERM GOAL #1   Title pt will be I with inital HEP (10/09/2015)   Baseline no previous HEP   Time  3   Period Weeks   Status New     PT SHORT TERM GOAL #2   Title pt will be able to verbalize and demo techniques to reduce R knee pain and inflammation via RICE and HEP (10/09/2015)   Baseline no previous knowledge of how to decrease pain and infammation   Time 3   Period Weeks   Status New  PT Long Term Goals - 09/18/15 0907      PT LONG TERM GOAL #1   Title pt will be I with advance HEP as of last visit ( 10/30/2015)   Baseline no previous HEP   Time 6   Period Weeks   Status New     PT LONG TERM GOAL #2   Title pt will improve R knee flexion to >/= 90 degrees with </= 3/10 pain to promote functional and efficient gait pattern (10/30/2015)   Baseline unable to actively get active flexion    Time 6   Period Weeks   Status New     PT LONG TERM GOAL #3   Title pt will be able to walk/ stand >/= 15 min and navigate >/= 10 sets reciprocally with =1 HHA on railing with no AD and </= 3/10 pain to promote functional mobility (10/30/2015)   Baseline walks 5 min, and on step at a time with crutches on stairs   Time 6   Period Weeks   Status New     PT LONG TERM GOAL #4   Title increase FOTO score to >/= 50% limited to demonstate improvement in function (10/30/2015)   Baseline inital foto SCore 60% limited    Time 6   Period Weeks   Status New     PT LONG TERM GOAL #5   Title increase R knee strenght to >/= 4-/5 with </=3/10 pain to promote safety with CKC activities (10/30/2015)   Baseline strength not assessed due to precautions   Time 6   Period Weeks   Status New               Plan - 09/18/15 0859    Clinical Impression Statement Mrs. Holtmeyer presents to OPPT as a low complexity evaluation s/p R ACL reconstruction on 09/03/2015. She demonstrates limited knee flexion with AROM/ PROm secondary to pain and stiffness. limited quad activation on the R. She demonstrates an antalgic gait pattern with limited weight bearing on the R with SL brace using crutches. pt  would benefit from physical therapy to decrease pain and edema, improve knee ROM, improve gait, and return pt to PLOF by addressing the defecits listed.   CPT code 16109   Rehab Potential Good   PT Frequency Biweekly   PT Duration 6 weeks   PT Treatment/Interventions ADLs/Self Care Home Management;Cryotherapy;Electrical Stimulation;Iontophoresis 4mg /ml Dexamethasone;Moist Heat;Ultrasound;Taping;Patient/family education;Gait training;Stair training;Manual techniques;Passive range of motion;Therapeutic activities;Therapeutic exercise;Balance training;Neuromuscular re-education   PT Next Visit Plan assess/ review HEP, knee ROM/ mobs, retro stepping for quad activation, Modalities for pain   PT Home Exercise Plan weight shifting forward/ laterally, quad set, heel slide with strap, hamstring stretch, SLR   Consulted and Agree with Plan of Care Patient;Family member/caregiver   Family Member Consulted significant other      Patient will benefit from skilled therapeutic intervention in order to improve the following deficits and impairments:  Pain, Improper body mechanics, Postural dysfunction, Increased edema, Decreased strength, Hypomobility, Decreased endurance, Decreased activity tolerance, Decreased balance, Increased fascial restricitons, Decreased range of motion, Abnormal gait  Visit Diagnosis: Pain in right knee - Plan: PT plan of care cert/re-cert  Stiffness of right knee, not elsewhere classified - Plan: PT plan of care cert/re-cert  Localized edema - Plan: PT plan of care cert/re-cert  Other abnormalities of gait and mobility - Plan: PT plan of care cert/re-cert     Problem List There are no active problems to display for this patient.  Lulu Riding PT, DPT, LAT, ATC  09/18/15  11:33 AM      [facility] [address] [place], [place], [address] Phone: [phone]   Fax:  [phone]  Name: Maribela Kennedy MRN:  [id number] Date of Birth: [birth year]

## 2015-09-23 ENCOUNTER — Ambulatory Visit: Payer: Medicaid Other | Admitting: Physical Therapy

## 2015-09-23 DIAGNOSIS — R2689 Other abnormalities of gait and mobility: Secondary | ICD-10-CM

## 2015-09-23 DIAGNOSIS — M25661 Stiffness of right knee, not elsewhere classified: Secondary | ICD-10-CM

## 2015-09-23 DIAGNOSIS — R600 Localized edema: Secondary | ICD-10-CM

## 2015-09-23 DIAGNOSIS — M25561 Pain in right knee: Secondary | ICD-10-CM

## 2015-09-23 NOTE — Therapy (Addendum)
[facility] [address] [place], [place], [address] Phone: [phone]   Fax:  [phone]  Physical Therapy Treatment  Patient Details  Name: Colleen Wells MRN: [id number] Date of Birth: February 09, 1986 Referring Provider: Cammy Copa MD  Encounter Date: 09/23/2015      PT End of Session - 09/23/15 1250    Visit Number 2   Number of Visits 4   Date for PT Re-Evaluation 11/13/15   Authorization Type Medicaid, pt opted to be seen today despite Medicaid not yet approving therapy.    PT Start Time 1155   PT Stop Time 1250   PT Time Calculation (min) 55 min   Activity Tolerance Patient tolerated treatment well   Behavior During Therapy WFL for tasks assessed/performed      Past Medical History:  Diagnosis Date  . Medical history non-contributory   . Meniscus tear     Past Surgical History:  Procedure Laterality Date  . ANTERIOR CRUCIATE LIGAMENT REPAIR Right 09/03/2015   Procedure: REPAIR ANTERIOR CRUCIATE LIGAMENT  REPAIR OF AVULSION;  Surgeon: Cammy Copa, MD;  Location: [facility] OR;  Service: Orthopedics;  Laterality: Right;  . CESAREAN SECTION    . MENISCUS REPAIR Right 09/03/2015   Procedure: REPAIR OF MENISCUS LATERAL;  Surgeon: Cammy Copa, MD;  Location: [facility] OR;  Service: Orthopedics;  Laterality: Right;    There were no vitals filed for this visit.      Subjective Assessment - 09/23/15 1208    Subjective "been consistent with the HEP, able to lift my leg up the second day"    Currently in Pain? Yes   Pain Score 4   took pain meds this AM   Pain Location Knee   Pain Orientation Right   Pain Descriptors / Indicators Aching   Pain Type Surgical pain   Pain Onset 1 to 4 weeks ago   Pain Frequency Constant            [facility] PT Assessment - 09/23/15 0001      PROM   Right Knee Flexion 35  AAROM                     [facility] Adult PT Treatment/Exercise - 09/23/15 0001       Knee/Hip Exercises: Stretches   Quad Stretch 5 reps;20 seconds  with strap in prone     Knee/Hip Exercises: Standing   Other Standing Knee Exercises weight shifting/ retrostepping x 5 min  to facilitate quad activation     Knee/Hip Exercises: Supine   Short Arc Quad Sets AROM;Strengthening;1 set;10 reps   Heel Slides AROM;Strengthening;1 set;10 reps  with strap   Straight Leg Raises AROM;Strengthening;1 set;10 reps     Knee/Hip Exercises: Sidelying   Hip ABduction AROM;Strengthening;2 sets;10 reps  1 set no weight, 1 set 3#     Modalities   Modalities Vasopneumatic     Vasopneumatic   Number Minutes Vasopneumatic  10 minutes   Vasopnuematic Location  Knee   Vasopneumatic Pressure Medium   Vasopneumatic Temperature  32     Manual Therapy   Manual Therapy Joint mobilization   Manual therapy comments in sitting with ankle supported, grade 2 mobs A>P to facilitate fleixon x 10 min                PT Education - 09/23/15 1249    Education provided Yes   Education Details HEP review   Person(s) Educated Patient   Methods  Explanation   Comprehension Verbalized understanding          PT Short Term Goals - 09/18/15 0905      PT SHORT TERM GOAL #1   Title pt will be I with inital HEP (10/09/2015)   Baseline no previous HEP   Time 3   Period Weeks   Status New     PT SHORT TERM GOAL #2   Title pt will be able to verbalize and demo techniques to reduce R knee pain and inflammation via RICE and HEP (10/09/2015)   Baseline no previous knowledge of how to decrease pain and infammation   Time 3   Period Weeks   Status New           PT Long Term Goals - 09/18/15 0907      PT LONG TERM GOAL #1   Title pt will be I with advance HEP as of last visit ( 10/30/2015)   Baseline no previous HEP   Time 6   Period Weeks   Status New     PT LONG TERM GOAL #2   Title pt will improve R knee flexion to >/= 90 degrees with </= 3/10 pain to promote functional and  efficient gait pattern (10/30/2015)   Baseline unable to actively get active flexion    Time 6   Period Weeks   Status New     PT LONG TERM GOAL #3   Title pt will be able to walk/ stand >/= 15 min and navigate >/= 10 sets reciprocally with =1 HHA on railing with no AD and </= 3/10 pain to promote functional mobility (10/30/2015)   Baseline walks 5 min, and on step at a time with crutches on stairs   Time 6   Period Weeks   Status New     PT LONG TERM GOAL #4   Title increase FOTO score to >/= 50% limited to demonstate improvement in function (10/30/2015)   Baseline inital foto SCore 60% limited    Time 6   Period Weeks   Status New     PT LONG TERM GOAL #5   Title increase R knee strenght to >/= 4-/5 with </=3/10 pain to promote safety with CKC activities (10/30/2015)   Baseline strength not assessed due to precautions   Time 6   Period Weeks   Status New               Plan - 09/23/15 1250    Clinical Impression Statement [name] reports she has been consistent with her HEP. she improve her AAROM knee flexion to 30 degrees with strap. practiced retrostepping to work on getting quad activation. utilized Vaso to calm down pain and inflammation post session.    PT Next Visit Plan assess/ review HEP, knee ROM/ mobs, retro stepping for quad activation, Modalities for pain   Consulted and Agree with Plan of Care Patient;Family member/caregiver   Family Member Consulted significant other      Patient will benefit from skilled therapeutic intervention in order to improve the following deficits and impairments:  Pain, Improper body mechanics, Postural dysfunction, Increased edema, Decreased strength, Hypomobility, Decreased endurance, Decreased activity tolerance, Decreased balance, Increased fascial restricitons, Decreased range of motion, Abnormal gait  Visit Diagnosis: Pain in right knee  Stiffness of right knee, not elsewhere classified  Localized edema  Other  abnormalities of gait and mobility     Problem List There are no active problems to display for this patient.  Lulu Riding PT, DPT, LAT, ATC  09/23/15  1:34 PM     [facility] [address] [place], [place], [address] Phone: [phone]   Fax:  [phone]  Name: Colleen Wells MRN: [id number] Date of Birth: Aug 20, 1985

## 2015-09-24 ENCOUNTER — Ambulatory Visit: Payer: Self-pay | Admitting: Family Medicine

## 2015-09-27 ENCOUNTER — Other Ambulatory Visit: Payer: Self-pay | Admitting: Orthopedic Surgery

## 2015-09-30 ENCOUNTER — Other Ambulatory Visit: Payer: Self-pay | Admitting: Orthopedic Surgery

## 2015-10-07 ENCOUNTER — Encounter (HOSPITAL_COMMUNITY)
Admission: RE | Admit: 2015-10-07 | Discharge: 2015-10-07 | Disposition: A | Discharge status: Home / Self Care | Payer: Medicaid Other | Source: Ambulatory Visit | Attending: Orthopedic Surgery | Admitting: Orthopedic Surgery

## 2015-10-07 ENCOUNTER — Encounter (HOSPITAL_COMMUNITY): Payer: Self-pay

## 2015-10-07 ENCOUNTER — Ambulatory Visit: Payer: Medicaid Other | Admitting: Physical Therapy

## 2015-10-07 DIAGNOSIS — Z01812 Encounter for preprocedural laboratory examination: Secondary | ICD-10-CM | POA: Diagnosis not present

## 2015-10-07 DIAGNOSIS — M24661 Ankylosis, right knee: Secondary | ICD-10-CM | POA: Diagnosis not present

## 2015-10-07 HISTORY — DX: Ankylosis, unspecified knee: M24.669

## 2015-10-07 LAB — CBC
HEMATOCRIT: 41.7 % (ref 36.0–46.0)
HEMOGLOBIN: 13.8 g/dL (ref 12.0–15.0)
MCH: 28 pg (ref 26.0–34.0)
MCHC: 33.1 g/dL (ref 30.0–36.0)
MCV: 84.6 fL (ref 78.0–100.0)
Platelets: 287 10*3/uL (ref 150–400)
RBC: 4.93 MIL/uL (ref 3.87–5.11)
RDW: 12.6 % (ref 11.5–15.5)
WBC: 10.4 10*3/uL (ref 4.0–10.5)

## 2015-10-07 LAB — HCG, SERUM, QUALITATIVE: Preg, Serum: NEGATIVE

## 2015-10-07 MED ORDER — CEFAZOLIN SODIUM-DEXTROSE 2-4 GM/100ML-% IV SOLN
2.0000 g | INTRAVENOUS | Status: DC
  Filled 2015-10-07: qty 100

## 2015-10-07 NOTE — Pre-Procedure Instructions (Addendum)
    Janey [facility]guna [facility]  10/07/2015      [facility] [phone] [place]  - [address] [facility] [address] [place] [address]  [place] [id number] Phone: [phone] Fax: [phone]    Your procedure is scheduled on : Tuesday, October 08, 2015  [facility] at 3:15 P.M.  Call this number if you have problems the morning of surgery:  [phone]   Remember:  Do not eat food or drink liquids after midnight.  Take these medicines the morning of surgery with A SIP OF WATER: if needed: pain medication Stop taking Aspirin, vitamins, fish oil, and herbal medications. Do not take any NSAIDs ie: Ibuprofen, Advil, Naproxen, BC and Goody Powder or any medication containing Aspirin; stop now.  Do not wear jewelry, make-up or nail polish.  Do not wear lotions, powders, or perfumes.  You may not wear deoderant.  Do not shave 48 hours prior to surgery.    Do not bring valuables to the hospital.  [facility] is not responsible for any belongings or valuables.  Contacts, dentures or bridgework may not be worn into surgery.  Leave your suitcase in the car.  After surgery it may be brought to your room.  For patients admitted to the hospital, discharge time will be determined by your treatment team.  Patients discharged the day of surgery will not be allowed to drive home.   Name and phone number of your driver:   Special instructions: Shower the night before surgery and the morning of surgery with CHG.  Please read over the following fact sheets that you were given. Pain Booklet, Coughing and Deep Breathing and Surgical Site Infection Prevention

## 2015-10-07 NOTE — Progress Notes (Signed)
Pt denies SOB, chest pain, and being under the care of a cardiologist. Pt denies having a cardiac history but stated that an echo was completed over 15 years ago. Pt stated that she does not remember where the echo was performed and the name of the physician that ordered it. Pt stated that the echo was negative, " they didn't find anything." Pt denies having a stress test and cardiac cath. Pt denies having an EKG and chest x ray within the last year. Pt denies having any labs done within the last 2 weeks.

## 2015-10-07 NOTE — H&P (Signed)
Colleen Wells is an 30 y.o. female.   Chief Complaint: Right knee stiffness HPI: She given Colleen Wells is a 30 year old female with right knee stiffness.  She had significant knee injury treated proximally 4 weeks ago.  This was a complete lateral meniscal tear with subluxation of the meniscus anteriorly along with anterior cruciate ligament avulsion.  She underwent lateral meniscal repair along with anterior cruciate ligament avulsion repair.  Initially did well but was having pain control issues and subsequently has developed a pretty stiff knee.  Infection become more stiff over the past 2 weeks in terms of loss of full extension.  First postop visit patient had full extension second postop visit in subsequent visits of shown about 7 gradual development of a flexion contracture in addition she has noted she flexion past 90.  Past Medical History:  Diagnosis Date  . Arthrofibrosis of knee joint    right  . Medical history non-contributory   . Meniscus tear     Past Surgical History:  Procedure Laterality Date  . ANTERIOR CRUCIATE LIGAMENT REPAIR Right 09/03/2015   Procedure: REPAIR ANTERIOR CRUCIATE LIGAMENT  REPAIR OF AVULSION;  Surgeon: Cammy CopaScott Arsenio Schnorr, MD;  Location: [facility] OR;  Service: Orthopedics;  Laterality: Right;  . CESAREAN SECTION    . MENISCUS REPAIR Right 09/03/2015   Procedure: REPAIR OF MENISCUS LATERAL;  Surgeon: Cammy CopaScott Shaune Westfall, MD;  [facility];  Service: Orthopedics;  Laterality: Right;    No family history on file. Social History:  reports that she has never smoked. She has never used smokeless tobacco. She reports that she does not drink alcohol or use drugs.  Allergies:  Allergies  Allergen Reactions  . No Known Allergies     No prescriptions prior to admission.    Results for orders placed or performed during the hospital encounter of 10/07/15 (from the past 48 hour(s))  CBC     Status: None   Collection Time: 10/07/15  4:11 PM  Result Value Ref  Range   WBC 10.4 4.0 - 10.5 K/uL   RBC 4.93 3.87 - 5.11 MIL/uL   Hemoglobin 13.8 12.0 - 15.0 g/dL   HCT 16.141.7 09.636.0 - 04.546.0 %   MCV 84.6 78.0 - 100.0 fL   MCH 28.0 26.0 - 34.0 pg   MCHC 33.1 30.0 - 36.0 g/dL   RDW 40.912.6 81.111.5 - 91.415.5 %   Platelets 287 150 - 400 K/uL  hCG, serum, qualitative     Status: None   Collection Time: 10/07/15  4:11 PM  Result Value Ref Range   Preg, Serum NEGATIVE NEGATIVE    Comment:        THE SENSITIVITY OF THIS METHODOLOGY IS >10 mIU/mL.    No results found.  Review of Systems  Musculoskeletal: Positive for joint pain.  All other systems reviewed and are negative.   Last menstrual period 04/15/2015. Physical Exam  Constitutional: She appears well-developed.  HENT:  Head: Normocephalic.  Eyes: Pupils are equal, round, and reactive to light.  Neck: Normal range of motion.  Cardiovascular: Normal rate.   Respiratory: Effort normal.  Neurological: She is alert.  Skin: Skin is warm.  Psychiatric: She has a normal mood and affect.   examination of the right knee demonstrates general patella decrease in mobility no effusion or warmth to the knee has about a 10 flexion contracture flexion itself is only to about 60 graft is stable pedal pulses palpable ankle dorsi and plantarflexion is intact  Assessment/Plan Impression is right knee  arthrofibrosis status post lateral meniscal repair and anterior cruciate ligament avulsion repair.  Graft is stable but her knee currently has arthrofibrosis patient needs an attempt at manipulation in order to restore range of motion.  This will be a painful process for the patient and it is explained as such.  She'll need to spend at least 6-7 hours per day on the CPM machine post manipulation or to maintain her range of motion gains.  Risks and benefits of surgery including not limited to infection potential for graft rupture and meniscal rupture are discussed all questions answered plan on outpatient surgery with subsequent  initiation of CPM on the day of surgery.  Cammy CopaEAN,Cathy Ropp SCOTT, MD 10/07/2015, 6:33 PM

## 2015-10-08 ENCOUNTER — Encounter (HOSPITAL_COMMUNITY)
Admission: RE | Disposition: A | Discharge status: Home / Self Care | Payer: Self-pay | Source: Ambulatory Visit | Attending: Orthopedic Surgery

## 2015-10-08 ENCOUNTER — Ambulatory Visit (HOSPITAL_COMMUNITY): Payer: Medicaid Other | Admitting: Anesthesiology

## 2015-10-08 ENCOUNTER — Encounter (HOSPITAL_COMMUNITY): Payer: Self-pay | Admitting: *Deleted

## 2015-10-08 ENCOUNTER — Ambulatory Visit (HOSPITAL_COMMUNITY)
Admission: RE | Admit: 2015-10-08 | Discharge: 2015-10-08 | Disposition: A | Discharge status: Home / Self Care | Payer: Medicaid Other | Source: Ambulatory Visit | Attending: Orthopedic Surgery | Admitting: Orthopedic Surgery

## 2015-10-08 DIAGNOSIS — Z791 Long term (current) use of non-steroidal anti-inflammatories (NSAID): Secondary | ICD-10-CM | POA: Insufficient documentation

## 2015-10-08 DIAGNOSIS — M24661 Ankylosis, right knee: Secondary | ICD-10-CM | POA: Insufficient documentation

## 2015-10-08 HISTORY — PX: KNEE CLOSED REDUCTION: SHX995

## 2015-10-08 SURGERY — MANIPULATION, KNEE, CLOSED
Anesthesia: General | Site: Knee | Laterality: Right

## 2015-10-08 MED ORDER — OXYCODONE HCL 5 MG/5ML PO SOLN: 5.0000 mg | Freq: Once | ORAL | Status: AC | PRN

## 2015-10-08 MED ORDER — OXYCODONE HCL 5 MG PO TABS: 5.0000 mg | ORAL_TABLET | Freq: Four times a day (QID) | ORAL | 0 refills | Status: DC | PRN

## 2015-10-08 MED ORDER — MORPHINE SULFATE (PF) 4 MG/ML IV SOLN
INTRAVENOUS | Status: AC
  Filled 2015-10-08: qty 2

## 2015-10-08 MED ORDER — BUPIVACAINE HCL 0.25 % IJ SOLN
INTRAMUSCULAR | Status: DC | PRN
  Administered 2015-10-08: 10 mL via INTRA_ARTICULAR

## 2015-10-08 MED ORDER — FENTANYL CITRATE (PF) 100 MCG/2ML IJ SOLN
INTRAMUSCULAR | Status: AC
  Administered 2015-10-08: 50 ug via INTRAVENOUS
  Filled 2015-10-08: qty 2

## 2015-10-08 MED ORDER — PROPOFOL 10 MG/ML IV BOLUS
INTRAVENOUS | Status: AC
  Filled 2015-10-08: qty 20

## 2015-10-08 MED ORDER — CHLORHEXIDINE GLUCONATE 4 % EX LIQD: 60.0000 mL | Freq: Once | CUTANEOUS | Status: DC

## 2015-10-08 MED ORDER — OXYCODONE HCL 5 MG PO TABS
5.0000 mg | ORAL_TABLET | Freq: Once | ORAL | Status: AC | PRN
  Administered 2015-10-08: 5 mg via ORAL

## 2015-10-08 MED ORDER — MIDAZOLAM HCL 2 MG/2ML IJ SOLN: 0.5000 mg | Freq: Once | INTRAMUSCULAR | Status: DC | PRN

## 2015-10-08 MED ORDER — PROMETHAZINE HCL 25 MG/ML IJ SOLN: 6.2500 mg | INTRAMUSCULAR | Status: DC | PRN

## 2015-10-08 MED ORDER — BUPIVACAINE-EPINEPHRINE (PF) 0.5% -1:200000 IJ SOLN
INTRAMUSCULAR | Status: DC | PRN
  Administered 2015-10-08: 30 mL via PERINEURAL

## 2015-10-08 MED ORDER — HYDROMORPHONE HCL 1 MG/ML IJ SOLN: 0.2500 mg | INTRAMUSCULAR | Status: DC | PRN

## 2015-10-08 MED ORDER — MEPERIDINE HCL 25 MG/ML IJ SOLN: 6.2500 mg | INTRAMUSCULAR | Status: DC | PRN

## 2015-10-08 MED ORDER — OXYCODONE HCL 5 MG PO TABS
ORAL_TABLET | ORAL | Status: AC
  Filled 2015-10-08: qty 1

## 2015-10-08 MED ORDER — LACTATED RINGERS IV SOLN
INTRAVENOUS | Status: DC
  Administered 2015-10-08: 16:00:00 via INTRAVENOUS

## 2015-10-08 MED ORDER — MIDAZOLAM HCL 2 MG/2ML IJ SOLN
INTRAMUSCULAR | Status: AC
  Administered 2015-10-08: 1 mg via INTRAVENOUS
  Filled 2015-10-08: qty 2

## 2015-10-08 MED ORDER — MORPHINE SULFATE (PF) 4 MG/ML IV SOLN
INTRAVENOUS | Status: DC | PRN
  Administered 2015-10-08: 8 mg via INTRAMUSCULAR

## 2015-10-08 MED ORDER — FENTANYL CITRATE (PF) 100 MCG/2ML IJ SOLN
50.0000 ug | Freq: Once | INTRAMUSCULAR | Status: AC
  Administered 2015-10-08: 50 ug via INTRAVENOUS

## 2015-10-08 MED ORDER — CLONIDINE HCL (ANALGESIA) 100 MCG/ML EP SOLN
EPIDURAL | Status: DC | PRN
  Administered 2015-10-08: 1 mL via INTRA_ARTICULAR

## 2015-10-08 MED ORDER — MIDAZOLAM HCL 2 MG/2ML IJ SOLN
1.0000 mg | Freq: Once | INTRAMUSCULAR | Status: AC
  Administered 2015-10-08: 1 mg via INTRAVENOUS

## 2015-10-08 MED ORDER — LIDOCAINE 2% (20 MG/ML) 5 ML SYRINGE
INTRAMUSCULAR | Status: DC | PRN
  Administered 2015-10-08: 80 mg via INTRAVENOUS

## 2015-10-08 MED ORDER — PROPOFOL 10 MG/ML IV BOLUS
INTRAVENOUS | Status: DC | PRN
  Administered 2015-10-08: 180 mg via INTRAVENOUS

## 2015-10-08 SURGICAL SUPPLY — 8 items
CONT SPEC 4OZ CLIKSEAL STRL BL (MISCELLANEOUS) ×3 IMPLANT
DRSG TEGADERM 2-3/8X2-3/4 SM (GAUZE/BANDAGES/DRESSINGS) ×3 IMPLANT
GAUZE SPONGE 2X2 8PLY STRL LF (GAUZE/BANDAGES/DRESSINGS) ×2 IMPLANT
MARKER SKIN DUAL TIP RULER LAB (MISCELLANEOUS) ×3 IMPLANT
NEEDLE 18GX1X1/2 (RX/OR ONLY) (NEEDLE) ×6 IMPLANT
SPONGE GAUZE 2X2 STER 10/PKG (GAUZE/BANDAGES/DRESSINGS) ×4
SYR 3ML LL SCALE MARK (SYRINGE) ×3 IMPLANT
SYRINGE 20CC LL (MISCELLANEOUS) ×6 IMPLANT

## 2015-10-08 NOTE — Anesthesia Preprocedure Evaluation (Addendum)
Anesthesia Evaluation  Patient identified by MRN, date of birth, ID band Patient awake    Reviewed: Allergy & Precautions, NPO status , Patient's Chart, lab work & pertinent test results  Airway Mallampati: II   Neck ROM: Full    Dental  (+) Teeth Intact, Dental Advisory Given   Pulmonary    Pulmonary exam normal breath sounds clear to auscultation       Cardiovascular Normal cardiovascular exam Rhythm:Regular Rate:Normal     Neuro/Psych    GI/Hepatic   Endo/Other    Renal/GU      Musculoskeletal   Abdominal   Peds  Hematology   Anesthesia Other Findings   Reproductive/Obstetrics                            Anesthesia Physical Anesthesia Plan  ASA: II  Anesthesia Plan: General   Post-op Pain Management:    Induction: Intravenous  Airway Management Planned: Mask  Additional Equipment:   Intra-op Plan:   Post-operative Plan: Extubation in OR  Informed Consent: I have reviewed the patients History and Physical, chart, labs and discussed the procedure including the risks, benefits and alternatives for the proposed anesthesia with the patient or authorized representative who has indicated his/her understanding and acceptance.   Dental advisory given  Plan Discussed with: CRNA, Anesthesiologist and Surgeon  Anesthesia Plan Comments:         Anesthesia Quick Evaluation

## 2015-10-08 NOTE — Anesthesia Postprocedure Evaluation (Signed)
Anesthesia Post Note  Patient: Colleen PaciniShguna Wells  Procedure(s) Performed: Procedure(s) (LRB): CLOSED MANIPULATION KNEE UNDER ANESTHESIA (Right)  Patient location during evaluation: PACU Anesthesia Type: General and Regional Level of consciousness: awake and alert Pain management: pain level controlled Vital Signs Assessment: post-procedure vital signs reviewed and stable Respiratory status: spontaneous breathing, nonlabored ventilation and respiratory function stable Cardiovascular status: blood pressure returned to baseline and stable Postop Assessment: no signs of nausea or vomiting Anesthetic complications: no    Last Vitals:  Vitals:   10/08/15 1730 10/08/15 1731  BP:    Pulse: 85 84  Resp: 17 18  Temp:      Last Pain:  Vitals:   10/08/15 1730  TempSrc:   PainSc: 3         RLE Motor Response: Purposeful movement;Responds to commands (10/08/15 1730) RLE Sensation: Numbness (had block) (10/08/15 1730)      Kristi Hyer,E. Hadyn Blanck

## 2015-10-08 NOTE — Anesthesia Procedure Notes (Signed)
Date/Time: 10/08/2015 5:01 PM Performed by: Charm BargesBUTLER, Amariss Detamore R Pre-anesthesia Checklist: Patient identified, Emergency Drugs available, Suction available, Patient being monitored and Timeout performed Patient Re-evaluated:Patient Re-evaluated prior to inductionOxygen Delivery Method: Circle system utilized Preoxygenation: Pre-oxygenation with 100% oxygen Intubation Type: IV induction Ventilation: Mask ventilation without difficulty Placement Confirmation: positive ETCO2 Dental Injury: Teeth and Oropharynx as per pre-operative assessment

## 2015-10-08 NOTE — Transfer of Care (Signed)
Immediate Anesthesia Transfer of Care Note  Patient: Colleen Wells  Procedure(s) Performed: Procedure(s): CLOSED MANIPULATION KNEE UNDER ANESTHESIA (Right)  Patient Location: PACU  Anesthesia Type:General  Level of Consciousness: awake, oriented and patient cooperative  Airway & Oxygen Therapy: Patient Spontanous Breathing and Patient connected to nasal cannula oxygen  Post-op Assessment: Report given to RN, Post -op Vital signs reviewed and stable and Patient moving all extremities  Post vital signs: Reviewed and stable  Last Vitals:  Vitals:   10/08/15 1622 10/08/15 1623  BP:    Pulse:    Resp: 16 17  Temp:      Last Pain:  Vitals:   10/08/15 1546  TempSrc: Oral  PainSc:          Complications: No apparent anesthesia complications

## 2015-10-08 NOTE — Brief Op Note (Signed)
10/08/2015  5:10 PM  PATIENT:  Colleen Wells  30 y.o. female  PRE-OPERATIVE DIAGNOSIS:  RIGHT KNEE ARTHROFIBROSIS  POST-OPERATIVE DIAGNOSIS:  Right knee arthrofibrosis  PROCEDURE:  Procedure(s): CLOSED MANIPULATION KNEE with anesthetic injection  SURGEON:  Surgeon(s): Cammy CopaScott Gregory Dean, MD  ASSISTANT: None  ANESTHESIA:   general  EBL: None    No intake/output data recorded.  BLOOD ADMINISTERED: none  DRAINS: none   LOCAL MEDICATIONS USED:  Marcaine morphine and clonidine  SPECIMEN:  No Specimen  COUNTS:  YES  TOURNIQUET:  * No tourniquets in log *  DICTATION: .Other Dictation: Dictation Number done  PLAN OF CARE: Discharge to home after PACU  PATIENT DISPOSITION:  PACU - hemodynamically stable

## 2015-10-08 NOTE — Anesthesia Procedure Notes (Signed)
Anesthesia Regional Block:  Femoral nerve block  Pre-Anesthetic Checklist: ,, timeout performed, Correct Patient, Correct Site, Correct Laterality, Correct Procedure, Correct Position, site marked, Risks and benefits discussed,  Surgical consent,  Pre-op evaluation,  At surgeon's request and post-op pain management  Laterality: Right and Lower  Prep: chloraprep       Needles:   Needle Type: Stimulator Needle - 40     Needle Length: 5cm 5 cm Needle Gauge: 22 and 22 G    Additional Needles:  Procedures: nerve stimulator Femoral nerve block  Nerve Stimulator or Paresthesia:  Response: patellar twitch, 0.45 mA, 0.1 ms,   Additional Responses:   Narrative:  Start time: 10/08/2015 3:50 PM End time: 10/08/2015 3:56 PM Injection made incrementally with aspirations every 5 mL.  Performed by: Personally  Anesthesiologist: Jean RosenthalJACKSON, Theran Vandergrift  Additional Notes: Pt identified in Holding room.  Monitors applied. Working IV access confirmed. Sterile prep R groin.  #22ga PNS to patella twitch at 0.4545mA threshold.  30cc 0.5% Bupivacaine with 1:200k epi injected incrementally after negative test dose.  Patient asymptomatic, VSS, no heme aspirated, tolerated well.

## 2015-10-08 NOTE — Interval H&P Note (Signed)
History and Physical Interval Note:  10/08/2015 7:15 AM  Colleen Wells  has presented today for surgery, with the diagnosis of RIGHT KNEE ARTHROFIBROSIS  The various methods of treatment have been discussed with the patient and family. After consideration of risks, benefits and other options for treatment, the patient has consented to  Procedure(s): CLOSED MANIPULATION KNEE (Right) as a surgical intervention .  The patient's history has been reviewed, patient examined, no change in status, stable for surgery.  I have reviewed the patient's chart and labs.  Questions were answered to the patient's satisfaction.     DEAN,GREGORY SCOTT

## 2015-10-09 ENCOUNTER — Ambulatory Visit: Payer: Medicaid Other | Attending: Orthopedic Surgery | Admitting: Physical Therapy

## 2015-10-09 ENCOUNTER — Encounter (HOSPITAL_COMMUNITY): Payer: Self-pay | Admitting: Orthopedic Surgery

## 2015-10-09 ENCOUNTER — Ambulatory Visit: Payer: Medicaid Other | Admitting: Physical Therapy

## 2015-10-09 DIAGNOSIS — M25661 Stiffness of right knee, not elsewhere classified: Secondary | ICD-10-CM | POA: Diagnosis present

## 2015-10-09 DIAGNOSIS — R2689 Other abnormalities of gait and mobility: Secondary | ICD-10-CM | POA: Insufficient documentation

## 2015-10-09 DIAGNOSIS — R600 Localized edema: Secondary | ICD-10-CM | POA: Diagnosis present

## 2015-10-09 DIAGNOSIS — M25561 Pain in right knee: Secondary | ICD-10-CM | POA: Diagnosis not present

## 2015-10-09 NOTE — Op Note (Signed)
NAMTemple Pacini:  Colleen Wells, Colleen Wells            ACCOUNT NO.:  [id number]  MEDICAL RECORD NO.:  [id number]  LOCATION:                                 FACILITY:  PHYSICIAN:  Burnard BuntingG. Scott Lena Gores, M.D.    DATE OF BIRTH:  09-30-1985  DATE OF PROCEDURE: DATE OF DISCHARGE:                              OPERATIVE REPORT   PREOPERATIVE DIAGNOSIS:  Right knee arthrofibrosis.  POSTOPERATIVE DIAGNOSIS:  Right knee arthrofibrosis.  PROCEDURE:  Right knee manipulation under anesthesia with injection of postop Marcaine, clonidine, morphine into the knee.  SURGEON:  Burnard BuntingG. Scott Hennie Gosa, MD  ASSISTANT:  None.  ANESTHESIA:  General.  INDICATIONS:  The patient is about 4 weeks out right knee lateral meniscal repair with PCL, avulsion fracture repair presented now for operative management after experiencing arthrofibrosis postop.  PROCEDURE IN DETAIL:  The patient was brought to the operating room where general anesthetic was induced.  Examination under anesthesia was performed.  The patient had about 10 degree flexion contracture and had about 0-30 degrees of flexion.  After time-out was called, the right knee was manipulated into about 5 degrees of full extension and then manipulated into about 135 of flexion.  Graft remained stable after the manipulation, collateral stable after manipulation.  At this time, DuraPrep was used to prep the superolateral knee and injection of Marcaine, morphine finally injected into the joint space.  The patient tolerated the procedure well.  Ace wrap applied, transferred to the recovery room in stable condition.  We will start CPM machine tonight.     Burnard BuntingG. Scott Prisca Gearing, M.D.   ______________________________ Reece AgarG. Dorene GrebeScott Daisy Mcneel, M.D.    GSD/MEDQ  D:  10/08/2015  T:  10/09/2015  Job:  [id number]

## 2015-10-09 NOTE — Therapy (Signed)
[facility] [address] [place], [place], [address] Phone: [phone]   Fax:  [phone]  Physical Therapy Treatment/Re-evaluation  Patient Details  Name: Temple PaciniShguna Duling MRN: [id number] Date of Birth: 26-Feb-1985 Referring Provider: Cammy CopaGregory Scott Dean MD  Encounter Date: 10/09/2015      PT End of Session - 10/09/15 1610    Visit Number 3   Number of Visits 4   Date for PT Re-Evaluation 11/13/15   Authorization Type Medicaid, pt opted to be seen today despite Medicaid not yet approving therapy.    PT Start Time 1520   PT Stop Time 1625   PT Time Calculation (min) 65 min   Activity Tolerance Patient limited by pain;Patient tolerated treatment well   Behavior During Therapy [facility] for tasks assessed/performed      Past Medical History:  Diagnosis Date  . Arthrofibrosis of knee joint    right  . Medical history non-contributory   . Meniscus tear     Past Surgical History:  Procedure Laterality Date  . ANTERIOR CRUCIATE LIGAMENT REPAIR Right 09/03/2015   Procedure: REPAIR ANTERIOR CRUCIATE LIGAMENT  REPAIR OF AVULSION;  Surgeon: Cammy CopaScott Gregory Dean, MD;  Location: [facility] OR;  Service: Orthopedics;  Laterality: Right;  . CESAREAN SECTION    . KNEE CLOSED REDUCTION Right 10/08/2015   Procedure: CLOSED MANIPULATION KNEE UNDER ANESTHESIA;  Surgeon: Cammy CopaScott Gregory Dean, MD;  [facility];  Service: Orthopedics;  Laterality: Right;  . MENISCUS REPAIR Right 09/03/2015   Procedure: REPAIR OF MENISCUS LATERAL;  Surgeon: Cammy CopaScott Gregory Dean, MD;  [facility];  Service: Orthopedics;  Laterality: Right;    There were no vitals filed for this visit.          [facility] - 10/09/15 0001      Assessment   Onset Date/Surgical Date 10/08/15   Prior Therapy 8/21     PROM   Right Knee Extension -16   Right Knee Flexion 90     Strength   Overall Strength Comments R knee ext 2+     Palpation   Palpation comment limited  mobility of incisions     Ambulation/Gait   Assistive device L Axillary Crutch;R Axillary Crutch   Gait Comments cues for heel toe                     [facility] Adult PT Treatment/Exercise - 10/09/15 0001      Vasopneumatic   Number Minutes Vasopneumatic  15 minutes   Vasopnuematic Location  Knee   Vasopneumatic Pressure Medium   Vasopneumatic Temperature  32     Manual Therapy   Manual therapy comments manual flexion/ext of knee, edema mobilzation                PT Education - 10/09/15 1601    Education provided Yes   Education Details importance of ROM, HEP   Person(s) Educated Patient   Methods Explanation;Demonstration;Tactile cues;Verbal cues;Handout   Comprehension Verbalized understanding;Returned demonstration;Verbal cues required;Tactile cues required;Need further instruction          PT Short Term Goals - 10/09/15 1622      PT SHORT TERM GOAL #1   Title pt will be I with inital HEP (10/09/2015)   Baseline provided with new HEP 8/16   Status On-going     PT SHORT TERM GOAL #2   Title pt will be able to verbalize and demo techniques to reduce R knee pain and inflammation via RICE  and HEP (10/09/2015)   Status Achieved           PT Long Term Goals - 09/18/15 [id number]      PT LONG TERM GOAL #1   Title pt will be I with advance HEP as of last visit ( 10/30/2015)   Baseline no previous HEP   Time 6   Period Weeks   Status New     PT LONG TERM GOAL #2   Title pt will improve R knee flexion to >/= 90 degrees with </= 3/10 pain to promote functional and efficient gait pattern (10/30/2015)   Baseline unable to actively get active flexion    Time 6   Period Weeks   Status New     PT LONG TERM GOAL #3   Title pt will be able to walk/ stand >/= 15 min and navigate >/= 10 sets reciprocally with =1 HHA on railing with no AD and </= 3/10 pain to promote functional mobility (10/30/2015)   Baseline walks 5 min, and on step at a time with crutches on  stairs   Time 6   Period Weeks   Status New     PT LONG TERM GOAL #4   Title increase FOTO score to >/= 50% limited to demonstate improvement in function (10/30/2015)   Baseline inital foto SCore 60% limited    Time 6   Period Weeks   Status New     PT LONG TERM GOAL #5   Title increase R knee strenght to >/= 4-/5 with </=3/10 pain to promote safety with CKC activities (10/30/2015)   Baseline strength not assessed due to precautions   Time 6   Period Weeks   Status New               Plan - 10/09/15 1619    Clinical Impression Statement Pt ptresents to PT today following mainpulation under anesthesia yesterday. Pt verbalized pain with ROM but was able to be passively flexed to 90 deg. Was instructed to increase CPM to 100 deg and was provided with HEP.    PT Treatment/Interventions ADLs/Self Care Home Management;Cryotherapy;Electrical Stimulation;Iontophoresis 4mg /ml Dexamethasone;Moist Heat;Ultrasound;Taping;Patient/family education;Gait training;Stair training;Manual techniques;Passive range of motion;Therapeutic activities;Therapeutic exercise;Balance training;Neuromuscular re-education   PT Next Visit Plan ROM   Consulted and Agree with Plan of Care Patient;Family member/caregiver   Family Member Consulted significant other      Patient will benefit from skilled therapeutic intervention in order to improve the following deficits and impairments:  Pain, Improper body mechanics, Postural dysfunction, Increased edema, Decreased strength, Hypomobility, Decreased endurance, Decreased activity tolerance, Decreased balance, Increased fascial restricitons, Decreased range of motion, Abnormal gait  Visit Diagnosis: Pain in right knee - Plan: PT plan of care cert/re-cert  Stiffness of right knee, not elsewhere classified - Plan: PT plan of care cert/re-cert  Localized edema - Plan: PT plan of care cert/re-cert  Other abnormalities of gait and mobility - Plan: PT plan of care  cert/re-cert     Problem List There are no active problems to display for this patient.   Lanessa Shill C. Tylar Amborn PT, DPT 10/09/15 4:27 PM   [facility] [address] [place], [place], [address] Phone: [phone]   Fax:  [phone]  Name: Temple PaciniShguna Carranza MRN: [id number] Date of Birth: Sep 08, 1985

## 2015-10-09 NOTE — Patient Instructions (Signed)
Access Code: [email or web address]  URL: [email or web address]  Date: 10/09/2015  Prepared by: Army FossaJessica Uilani Sanville   Exercises  Supine Hip and Knee Flexion PROM with Caregiver - 10 reps - 1 sets - 30 hold - 3x daily - 7x weekly  Supine Knee Flexion Wall Slide - 10 reps - 1 sets - 30 hold - 3x daily - 7x weekly  Supine Knee Extension Mobilization with Weight - 60 hold - 5x daily - 7x weekly  Figure 4 Hamstring Stretch - 10 reps - 1 sets - 30 hold - 3x daily - 7x weekly  Long Sitting Quad Set - 30 reps - 1 sets - 5 hold - 3x daily - 7x weekly  Supine Short Arc Quad - 10 reps - 3 sets - 5 hold - 3x daily - 7x weekly

## 2015-10-10 ENCOUNTER — Ambulatory Visit: Payer: Medicaid Other | Admitting: Physical Therapy

## 2015-10-10 NOTE — Progress Notes (Signed)
This encounter was created in error - please disregard.

## 2015-10-14 ENCOUNTER — Ambulatory Visit: Payer: Medicaid Other | Admitting: Physical Therapy

## 2015-10-14 DIAGNOSIS — R600 Localized edema: Secondary | ICD-10-CM

## 2015-10-14 DIAGNOSIS — R2689 Other abnormalities of gait and mobility: Secondary | ICD-10-CM

## 2015-10-14 DIAGNOSIS — M25661 Stiffness of right knee, not elsewhere classified: Secondary | ICD-10-CM

## 2015-10-14 DIAGNOSIS — M25561 Pain in right knee: Secondary | ICD-10-CM | POA: Diagnosis not present

## 2015-10-14 NOTE — Therapy (Signed)
[facility] [address] [place], [place], [address] Phone: [phone]   Fax:  [phone]  Physical Therapy Treatment / Re-certification  Patient Details  Name: Colleen PaciniShguna Wells MRN: [id number] Date of Birth: 23-May-1985 Referring Provider: Cammy CopaGregory Scott Dean MD  Encounter Date: 10/14/2015      PT End of Session - 10/14/15 1349    Visit Number 4   Number of Visits 13   Date for PT Re-Evaluation 11/13/15   Authorization Type following Medicaid approved visits, pt plans to be self pay.    PT Start Time 1345  pt arrived 15 minutes late today   PT Stop Time 1418   PT Time Calculation (min) 33 min   Activity Tolerance Patient tolerated treatment well   Behavior During Therapy WFL for tasks assessed/performed      Past Medical History:  Diagnosis Date  . Arthrofibrosis of knee joint    right  . Medical history non-contributory   . Meniscus tear     Past Surgical History:  Procedure Laterality Date  . ANTERIOR CRUCIATE LIGAMENT REPAIR Right 09/03/2015   Procedure: REPAIR ANTERIOR CRUCIATE LIGAMENT  REPAIR OF AVULSION;  Surgeon: Cammy CopaScott Gregory Dean, MD;  Location: [facility];  Service: Orthopedics;  Laterality: Right;  . CESAREAN SECTION    . KNEE CLOSED REDUCTION Right 10/08/2015   Procedure: CLOSED MANIPULATION KNEE UNDER ANESTHESIA;  Surgeon: Cammy CopaScott Gregory Dean, MD;  [facility];  Service: Orthopedics;  Laterality: Right;  . MENISCUS REPAIR Right 09/03/2015   Procedure: REPAIR OF MENISCUS LATERAL;  Surgeon: Cammy CopaScott Gregory Dean, MD;  [facility];  Service: Orthopedics;  Laterality: Right;    There were no vitals filed for this visit.      Subjective Assessment - 10/14/15 1347    Subjective "still working on the CPM, the pain isn't too bad"   Pain Score 2    Pain Orientation Right            [facility] PT Assessment - 10/14/15 0001      AROM   Right Knee Extension -20  -11 following manual   Right Knee Flexion 55   65 following manual     PROM   Right Knee Extension -16  -4 following manual   Right Knee Flexion 90  following manual                     [facility] Adult PT Treatment/Exercise - 10/14/15 0001      Knee/Hip Exercises: Aerobic   Recumbent Bike x 5 min  hiking L hip to get full rev     Knee/Hip Exercises: Supine   Quad Sets AROM;Strengthening;Both;3 sets;10 reps  with manual assist to promote ext     Manual Therapy   Manual Therapy Taping;Passive ROM   Manual therapy comments manual flexion/ext of knee, edema mobilzation   Passive ROM flexion with towel in popliteal space   Kinesiotex Edema     Kinesiotix   Edema R knee                  PT Short Term Goals - 10/09/15 1622      PT SHORT TERM GOAL #1   Title pt will be I with inital HEP (10/09/2015)   Baseline provided with new HEP 8/16   Status On-going     PT SHORT TERM GOAL #2   Title pt will be able to verbalize and demo techniques to reduce R knee pain and inflammation via  RICE and HEP (10/09/2015)   Status Achieved           PT Long Term Goals - 10/14/15 1543      PT LONG TERM GOAL #1   Title pt will be I with advance HEP as of last visit ( 10/30/2015)   Time 6   Period Weeks   Status On-going     PT LONG TERM GOAL #2   Title pt will improve R knee flexion to >/= 90 degrees with </= 3/10 pain to promote functional and efficient gait pattern (10/30/2015)   Time 6   Period Weeks   Status On-going     PT LONG TERM GOAL #3   Title pt will be able to walk/ stand >/= 15 min and navigate >/= 10 sets reciprocally with =1 HHA on railing with no AD and </= 3/10 pain to promote functional mobility (10/30/2015)   Time 6   Period Weeks   Status On-going     PT LONG TERM GOAL #4   Title increase FOTO score to >/= 50% limited to demonstate improvement in function (10/30/2015)   Time 6   Period Weeks   Status Unable to assess     PT LONG TERM GOAL #5   Title increase R knee strenght to >/= 4-/5  with </=3/10 pain to promote safety with CKC activities (10/30/2015)   Time 6   Period Weeks   Status On-going               Plan - 10/14/15 1518    Clinical Impression Statement Colleen Wells to demonstrate limited knee AROM/ PROM secondary to pain and stiffness. Focsued today on quad activiation with manual to get fleixon/ extension. performed KT tape trial to work on reducing swelling to assist with reduceing quad inhibition. She would benefit from conitnued phyiscal therapy to improve mobility and increase quad activition.    Rehab Potential Good   PT Frequency 2x / week   PT Duration 6 weeks   PT Treatment/Interventions ADLs/Self Care Home Management;Cryotherapy;Electrical Stimulation;Iontophoresis 4mg /ml Dexamethasone;Moist Heat;Ultrasound;Taping;Patient/family education;Gait training;Stair training;Manual techniques;Passive range of motion;Therapeutic activities;Therapeutic exercise;Balance training;Neuromuscular re-education   PT Next Visit Plan ROM, quad activation, bike vs Nu-step vaso for swelling,    PT Home Exercise Plan weight shifting forward/ laterally, quad set, heel slide with strap, hamstring stretch, SLR   Consulted and Agree with Plan of Care Patient;Family member/caregiver   Family Member Consulted significant other      Patient will benefit from skilled therapeutic intervention in order to improve the following deficits and impairments:  Pain, Improper body mechanics, Postural dysfunction, Increased edema, Decreased strength, Hypomobility, Decreased endurance, Decreased activity tolerance, Decreased balance, Increased fascial restricitons, Decreased range of motion, Abnormal gait  Visit Diagnosis: Pain in right knee - Plan: PT plan of care cert/re-cert  Stiffness of right knee, not elsewhere classified - Plan: PT plan of care cert/re-cert  Localized edema - Plan: PT plan of care cert/re-cert  Other abnormalities of gait and mobility - Plan: PT plan of care  cert/re-cert     Problem List There are no active problems to display for this patient.  Lulu RidingKristoffer Lorraina Spring PT, DPT, LAT, ATC  10/14/15  3:46 PM      [facility] [address] [place], [place], [address] Phone: [phone]   Fax:  [phone]  Name: Colleen PaciniShguna Wells MRN: [id number] Date of Birth: 12-16-1985

## 2015-10-15 ENCOUNTER — Ambulatory Visit: Payer: Medicaid Other

## 2015-10-15 DIAGNOSIS — M25561 Pain in right knee: Secondary | ICD-10-CM | POA: Diagnosis not present

## 2015-10-15 DIAGNOSIS — R600 Localized edema: Secondary | ICD-10-CM

## 2015-10-15 DIAGNOSIS — R2689 Other abnormalities of gait and mobility: Secondary | ICD-10-CM

## 2015-10-15 DIAGNOSIS — M25661 Stiffness of right knee, not elsewhere classified: Secondary | ICD-10-CM

## 2015-10-15 NOTE — Patient Instructions (Signed)
Asked pt to do more quad sets at home with reminder she probably walks up to 5000 steps per day so quad was working more than the 30 reps per day she is doing each day. Also asked her to lower CPM to 90 degrees and take more time to get leg in device correctly and if pulling to leave on 90 for tonite.

## 2015-10-15 NOTE — Therapy (Signed)
[facility] Outpatient Rehabilitation [facility] [address] [place], [place], [address] Phone: [phone]   Fax:  [phone]  Physical Therapy Treatment  Patient Details  Name: Colleen Wells MRN: [id number] Date of Birth: [birth year] Referring Provider: Cammy Copa MD  Encounter Date: 10/15/2015      PT End of Session - 10/15/15 1416    Visit Number 5   Number of Visits 13   Date for PT Re-Evaluation 11/13/15   Authorization Type following Medicaid approved visits, pt plans to be self pay.    PT Start Time 0215   PT Stop Time 0315   PT Time Calculation (min) 60 min   Activity Tolerance Patient tolerated treatment well   Behavior During Therapy [facility] for tasks assessed/performed      Past Medical History:  Diagnosis Date  . Arthrofibrosis of knee joint    right  . Medical history non-contributory   . Meniscus tear     Past Surgical History:  Procedure Laterality Date  . ANTERIOR CRUCIATE LIGAMENT REPAIR Right 09/03/2015   Procedure: REPAIR ANTERIOR CRUCIATE LIGAMENT  REPAIR OF AVULSION;  Surgeon: Cammy Copa, MD;  Location: [facility] OR;  Service: Orthopedics;  Laterality: Right;  . CESAREAN SECTION    . KNEE CLOSED REDUCTION Right 10/08/2015   Procedure: CLOSED MANIPULATION KNEE UNDER ANESTHESIA;  Surgeon: Cammy Copa, MD;  Location: [facility] OR;  Service: Orthopedics;  Laterality: Right;  . MENISCUS REPAIR Right 09/03/2015   Procedure: REPAIR OF MENISCUS LATERAL;  Surgeon: Cammy Copa, MD;  Location: [facility] OR;  Service: Orthopedics;  Laterality: Right;    There were no vitals filed for this visit.      Subjective Assessment - 10/15/15 1419    Subjective PAin not too bad. 105 CPM at home advanced since Saturday..  MD stated flexion 135 on OR.     Currently in Pain? Yes   Pain Score 4    Pain Location Knee   Pain Orientation Right   Pain Descriptors / Indicators Aching;Sore   Pain Type Surgical pain   Pain Onset 1 to 4 weeks ago   Pain Frequency Constant   Aggravating Factors  bending knee moving around   Pain Relieving Factors med ice   Multiple Pain Sites No            [facility] PT Assessment - 10/15/15 1440      AROM   Right Knee Flexion 50                     [facility] Adult PT Treatment/Exercise - 10/15/15 1437      Knee/Hip Exercises: Aerobic   Nustep 5 min LE only end level of seat then 3 min one notch closer x 3 mon for ROM     Knee/Hip Exercises: Supine   Quad Sets Strengthening;Right  25 reps  WITH TACTILE AND VERBAL CUES.     Straight Leg Raises Right;15 reps   Straight Leg Raises Limitations asssited with cue for quad contraction and pressing heel into my hand , cue to DF    Other Supine Knee/Hip Exercises Active flexion and extension (to -30 degrees) x20 in sets of 5     Vasopneumatic   Number Minutes Vasopneumatic  15 minutes   Vasopnuematic Location  Knee   Vasopneumatic Pressure Medium   Vasopneumatic Temperature  32     Manual Therapy   Manual Therapy Joint mobilization   Manual therapy comments A/P  glides gr 2-3  Passive ROM flexion with towel in popliteal space                PT Education - 10/15/15 1424    Education provided Yes   Education Details Related importance of incr ROM quickly as if progress is slow she may limit the amount of active ROM she has after rehab over.    Person(s) Educated Patient;Other (comment)   Methods Explanation   Comprehension Verbalized understanding          PT Short Term Goals - 10/09/15 1622      PT SHORT TERM GOAL #1   Title pt will be I with inital HEP (10/09/2015)   Baseline provided with new HEP 8/16   Status On-going     PT SHORT TERM GOAL #2   Title pt will be able to verbalize and demo techniques to reduce R knee pain and inflammation via RICE and HEP (10/09/2015)   Status Achieved           PT Long Term Goals - 10/14/15 1543      PT LONG TERM GOAL #1   Title pt will be I with advance HEP as of last  visit ( 10/30/2015)   Time 6   Period Weeks   Status On-going     PT LONG TERM GOAL #2   Title pt will improve R knee flexion to >/= 90 degrees with </= 3/10 pain to promote functional and efficient gait pattern (10/30/2015)   Time 6   Period Weeks   Status On-going     PT LONG TERM GOAL #3   Title pt will be able to walk/ stand >/= 15 min and navigate >/= 10 sets reciprocally with =1 HHA on railing with no AD and </= 3/10 pain to promote functional mobility (10/30/2015)   Time 6   Period Weeks   Status On-going     PT LONG TERM GOAL #4   Title increase FOTO score to >/= 50% limited to demonstate improvement in function (10/30/2015)   Time 6   Period Weeks   Status Unable to assess     PT LONG TERM GOAL #5   Title increase R knee strenght to >/= 4-/5 with </=3/10 pain to promote safety with CKC activities (10/30/2015)   Time 6   Period Weeks   Status On-going               Plan - 10/15/15 1416    Clinical Impression Statement Continues to be limited with flexion and extension RT kne e ROM .  Need to push motion to limit possible adhesion reformation.Marland Kitchen.    PT Treatment/Interventions ADLs/Self Care Home Management;Cryotherapy;Electrical Stimulation;Iontophoresis 4mg /ml Dexamethasone;Moist Heat;Ultrasound;Taping;Patient/family education;Gait training;Stair training;Manual techniques;Passive range of motion;Therapeutic activities;Therapeutic exercise;Balance training;Neuromuscular re-education   PT Next Visit Plan ROM, quad activation, bike vs Nu-step vaso for swelling,    Consulted and Agree with Plan of Care Patient   Family Member Consulted significant other      Patient will benefit from skilled therapeutic intervention in order to improve the following deficits and impairments:  Pain, Improper body mechanics, Postural dysfunction, Increased edema, Decreased strength, Hypomobility, Decreased endurance, Decreased activity tolerance, Decreased balance, Increased fascial  restricitons, Decreased range of motion, Abnormal gait  Visit Diagnosis: Pain in right knee  Stiffness of right knee, not elsewhere classified  Localized edema  Other abnormalities of gait and mobility     Problem List There are no active problems to display for this patient.  Caprice RedChasse, Dusty Raczkowski M  PT 10/15/2015, 3:02 PM  [facility] [address] [place], [place], [address] Phone: [phone]   Fax:  [phone]  Name: Temple PaciniShguna Lizotte MRN: [id number] Date of Birth: 12-01-1985

## 2015-10-16 ENCOUNTER — Ambulatory Visit: Payer: Medicaid Other | Admitting: Physical Therapy

## 2015-10-16 DIAGNOSIS — R600 Localized edema: Secondary | ICD-10-CM

## 2015-10-16 DIAGNOSIS — M25561 Pain in right knee: Secondary | ICD-10-CM | POA: Diagnosis not present

## 2015-10-16 DIAGNOSIS — R2689 Other abnormalities of gait and mobility: Secondary | ICD-10-CM

## 2015-10-16 DIAGNOSIS — M25661 Stiffness of right knee, not elsewhere classified: Secondary | ICD-10-CM

## 2015-10-16 NOTE — Therapy (Signed)
[facility] [address] [place], [place], [address] Phone: [phone]   Fax:  [phone]  Physical Therapy Treatment  Patient Details  Name: Colleen PaciniShguna Beagle MRN: [id number] Date of Birth: [birth year] Referring Provider: Cammy CopaGregory Scott Dean MD  Encounter Date: 10/16/2015      PT End of Session - 10/16/15 1555    Visit Number 6   Number of Visits 13   Date for PT Re-Evaluation 11/13/15   Authorization Type following Medicaid approved visits, pt plans to be self pay.    PT Start Time 1428  pt arrived 13 min late   PT Stop Time 1508   PT Time Calculation (min) 40 min   Activity Tolerance Patient limited by pain   Behavior [facility] assessed/performed      Past Medical History:  Diagnosis Date  . Arthrofibrosis of knee joint    right  . Medical history non-contributory   . Meniscus tear     Past Surgical History:  Procedure Laterality Date  . ANTERIOR CRUCIATE LIGAMENT REPAIR Right 09/03/2015   Procedure: REPAIR ANTERIOR CRUCIATE LIGAMENT  REPAIR OF AVULSION;  Surgeon: Cammy CopaScott Gregory Dean, MD;  Location: [facility] OR;  Service: Orthopedics;  Laterality: Right;  . CESAREAN SECTION    . KNEE CLOSED REDUCTION Right 10/08/2015   Procedure: CLOSED MANIPULATION KNEE UNDER ANESTHESIA;  Surgeon: Cammy CopaScott Gregory Dean, MD;  [facility];  Service: Orthopedics;  Laterality: Right;  . MENISCUS REPAIR Right 09/03/2015   Procedure: REPAIR OF MENISCUS LATERAL;  Surgeon: Cammy CopaScott Gregory Dean, MD;  [facility];  Service: Orthopedics;  Laterality: Right;    There were no vitals filed for this visit.      Subjective Assessment - 10/16/15 1430    Subjective "not having much pain but still having and having trouble keeping the knee straight when laying on the back"    Currently in Pain? Yes   Pain Score 2    Pain Location Knee   Pain Orientation Right   Pain Descriptors / Indicators Sore   Pain Type Surgical pain   Pain  Onset 1 to 4 weeks ago   Pain Frequency Constant            [facility] PT Assessment - 10/16/15 0001      AROM   Right Knee Extension -14   Right Knee Flexion 72     PROM   Right Knee Flexion 93  following manual                     [facility] Adult PT Treatment/Exercise - 10/16/15 0001      Knee/Hip Exercises: Aerobic   Recumbent Bike 6 min   lowering seat at 3 min     Knee/Hip Exercises: Supine   Quad Sets Strengthening;Right   Straight Leg Raises Right;15 reps     Vasopneumatic   Number Minutes Vasopneumatic  10 minutes   Vasopnuematic Location  Knee   Vasopneumatic Pressure Medium   Vasopneumatic Temperature  32     Manual Therapy   Manual therapy comments tibiofemoral A<>P Grade 3 mobs   Passive ROM flexion with towel in popliteal space with manual over pressure into flexion, with intermittent active hamstring contraction     Kinesiotix   Edema --                PT Education - 10/15/15 1424    Education provided Yes   Education Details Related importance of incr  ROM quickly as if progress is slow she may limit the amount of active ROM she has after rehab over.    Person(s) Educated Patient;Other (comment)   Methods Explanation   Comprehension Verbalized understanding          PT Short Term Goals - 10/09/15 1622      PT SHORT TERM GOAL #1   Title pt will be I with inital HEP (10/09/2015)   Baseline provided with new HEP 8/16   Status On-going     PT SHORT TERM GOAL #2   Title pt will be able to verbalize and demo techniques to reduce R knee pain and inflammation via RICE and HEP (10/09/2015)   Status Achieved           PT Long Term Goals - 10/14/15 1543      PT LONG TERM GOAL #1   Title pt will be I with advance HEP as of last visit ( 10/30/2015)   Time 6   Period Weeks   Status On-going     PT LONG TERM GOAL #2   Title pt will improve R knee flexion to >/= 90 degrees with </= 3/10 pain to promote functional and efficient gait  pattern (10/30/2015)   Time 6   Period Weeks   Status On-going     PT LONG TERM GOAL #3   Title pt will be able to walk/ stand >/= 15 min and navigate >/= 10 sets reciprocally with =1 HHA on railing with no AD and </= 3/10 pain to promote functional mobility (10/30/2015)   Time 6   Period Weeks   Status On-going     PT LONG TERM GOAL #4   Title increase FOTO score to >/= 50% limited to demonstate improvement in function (10/30/2015)   Time 6   Period Weeks   Status Unable to assess     PT LONG TERM GOAL #5   Title increase R knee strenght to >/= 4-/5 with </=3/10 pain to promote safety with CKC activities (10/30/2015)   Time 6   Period Weeks   Status On-going               Plan - 10/16/15 1503    Clinical Impression Statement [name] continues to demonstrate limited knee flexion/ extension secondary to pain / stiffness. Focused on mobs and flexion/ extension to work on regaining functional mobility. plan to continue working on aggressive ROM.    Rehab Potential Good   PT Frequency 5x / week   PT Duration 3 weeks   PT Treatment/Interventions ADLs/Self Care Home Management;Cryotherapy;Electrical Stimulation;Iontophoresis 4mg /ml Dexamethasone;Moist Heat;Ultrasound;Taping;Patient/family education;Gait training;Stair training;Manual techniques;Passive range of motion;Therapeutic activities;Therapeutic exercise;Balance training;Neuromuscular re-education   PT Next Visit Plan ROM, quad activation, bike vs Nu-step vaso for swelling,    Consulted and Agree with Plan of Care Patient;Family member/caregiver   Family Member Consulted significant other      Patient will benefit from skilled therapeutic intervention in order to improve the following deficits and impairments:  Pain, Improper body mechanics, Postural dysfunction, Increased edema, Decreased strength, Hypomobility, Decreased endurance, Decreased activity tolerance, Decreased balance, Increased fascial restricitons, Decreased  range of motion, Abnormal gait  Visit Diagnosis: Pain in right knee - Plan: PT plan of care cert/re-cert  Stiffness of right knee, not elsewhere classified - Plan: PT plan of care cert/re-cert  Localized edema - Plan: PT plan of care cert/re-cert  Other abnormalities of gait and mobility - Plan: PT plan of care cert/re-cert  Problem List There are no active problems to display for this patient.  Lulu RidingKristoffer Tyr Franca PT, DPT, LAT, ATC  10/16/15  4:06 PM      [facility] [address] [place], [place], [address] Phone: [phone]   Fax:  [phone]  Name: Colleen PaciniShguna Paola MRN: [id number] Date of Birth: March 08, 1985

## 2015-10-17 ENCOUNTER — Ambulatory Visit: Payer: Medicaid Other

## 2015-10-17 DIAGNOSIS — M25661 Stiffness of right knee, not elsewhere classified: Secondary | ICD-10-CM

## 2015-10-17 DIAGNOSIS — R600 Localized edema: Secondary | ICD-10-CM

## 2015-10-17 DIAGNOSIS — R2689 Other abnormalities of gait and mobility: Secondary | ICD-10-CM

## 2015-10-17 DIAGNOSIS — M25561 Pain in right knee: Secondary | ICD-10-CM

## 2015-10-17 NOTE — Patient Instructions (Signed)
Asked her to add knee flexion with prone hang in 2-3 sets of 10  And to try knee extension with hip at 90 degrees

## 2015-10-17 NOTE — Therapy (Signed)
[facility] Outpatient Rehabilitation [facility] [address] [place], [place], [address] Phone: [phone]   Fax:  [phone]  Physical Therapy Treatment  Patient Details  Name: Colleen Wells MRN: [id number] Date of Birth: April 19, 1985 Referring Provider: Cammy Copa MD  Encounter Date: 10/17/2015      PT End of Session - 10/17/15 1500    Visit Number 7   Number of Visits 13   Date for PT Re-Evaluation 11/13/15   Authorization Type following Medicaid approved visits, pt plans to be self pay.    PT Start Time 0300   PT Stop Time 0355   PT Time Calculation (min) 55 min   Activity Tolerance Patient limited by pain   Behavior During Therapy [facility] for tasks assessed/performed      Past Medical History:  Diagnosis Date  . Arthrofibrosis of knee joint    right  . Medical history non-contributory   . Meniscus tear     Past Surgical History:  Procedure Laterality Date  . ANTERIOR CRUCIATE LIGAMENT REPAIR Right 09/03/2015   Procedure: REPAIR ANTERIOR CRUCIATE LIGAMENT  REPAIR OF AVULSION;  Surgeon: Cammy Copa, MD;  Location: [facility] OR;  Service: Orthopedics;  Laterality: Right;  . CESAREAN SECTION    . KNEE CLOSED REDUCTION Right 10/08/2015   Procedure: CLOSED MANIPULATION KNEE UNDER ANESTHESIA;  Surgeon: Cammy Copa, MD;  Location: [facility] OR;  Service: Orthopedics;  Laterality: Right;  . MENISCUS REPAIR Right 09/03/2015   Procedure: REPAIR OF MENISCUS LATERAL;  Surgeon: Cammy Copa, MD;  Location: [facility] OR;  Service: Orthopedics;  Laterality: Right;    There were no vitals filed for this visit.      Subjective Assessment - 10/17/15 1502    Subjective Moved CPM to 105.  Hurt lkie crazy.    Currently in Pain? Yes   Pain Score 3   at rest   Pain Location Knee   Pain Orientation Right   Pain Descriptors / Indicators Sore   Pain Type Surgical pain   Pain Onset 1 to 4 weeks ago   Pain Frequency Constant   Aggravating Factors  bending knee    Pain Relieving Factors ice meds   Multiple Pain Sites No            [facility] PT Assessment - 10/17/15 0001      PROM   Right Knee Flexion 82  with flexion on bike to start session                     [facility] Adult PT Treatment/Exercise - 10/17/15 0001      Knee/Hip Exercises: Supine   Other Supine Knee/Hip Exercises passive knee flexion stretch with hip 90 degrees  and also TKE in this possition x 20 and resisted through heel knee extension with push toward ceiling x 20     Knee/Hip Exercises: Prone   Prone Knee Hang 5 minutes   Prone Knee Hang Limitations during the hand 2 sets of 10 reps knee flexion     Vasopneumatic   Vasopnuematic Location  Knee   Vasopneumatic Pressure Medium   Vasopneumatic Temperature  32     Manual Therapy   Passive ROM Knee flexion and extension along with sTW to quad and hamstrings and patell mobs most inf glide.,                PT Education - 10/17/15 1547    Education provided Yes   Education Details knee flexion during prone  hang at home, TKE hip flex 90 degrees   Person(s) Educated Patient;Other (comment)   Methods Explanation;Tactile cues;Verbal cues   Comprehension Verbalized understanding;Returned demonstration          PT Short Term Goals - 10/17/15 1545      PT SHORT TERM GOAL #1   Title pt will be I with inital HEP (10/09/2015)   Status On-going     PT SHORT TERM GOAL #2   Title pt will be able to verbalize and demo techniques to reduce R knee pain and inflammation via RICE and HEP (10/09/2015)   Status Achieved           PT Long Term Goals - 10/14/15 1543      PT LONG TERM GOAL #1   Title pt will be I with advance HEP as of last visit ( 10/30/2015)   Time 6   Period Weeks   Status On-going     PT LONG TERM GOAL #2   Title pt will improve R knee flexion to >/= 90 degrees with </= 3/10 pain to promote functional and efficient gait pattern (10/30/2015)   Time 6   Period Weeks   Status On-going      PT LONG TERM GOAL #3   Title pt will be able to walk/ stand >/= 15 min and navigate >/= 10 sets reciprocally with =1 HHA on railing with no AD and </= 3/10 pain to promote functional mobility (10/30/2015)   Time 6   Period Weeks   Status On-going     PT LONG TERM GOAL #4   Title increase FOTO score to >/= 50% limited to demonstate improvement in function (10/30/2015)   Time 6   Period Weeks   Status Unable to assess     PT LONG TERM GOAL #5   Title increase R knee strenght to >/= 4-/5 with </=3/10 pain to promote safety with CKC activities (10/30/2015)   Time 6   Period Weeks   Status On-going               Plan - 10/17/15 1501    Clinical Impression Statement She continues with limited ROM and pain RT knee .   I think we may need to ease off to decr irrtiationad and maybe gain some motion with decr swelling and pain  She came without  pain meds as she took last dose between 10 and eleven     PT Treatment/Interventions ADLs/Self Care Home Management;Cryotherapy;Electrical Stimulation;Iontophoresis 4mg /ml Dexamethasone;Moist Heat;Ultrasound;Taping;Patient/family education;Gait training;Stair training;Manual techniques;Passive range of motion;Therapeutic activities;Therapeutic exercise;Balance training;Neuromuscular re-education   PT Next Visit Plan ROM, quad activation, bike vs Nu-step vaso for swelling,    Consulted and Agree with Plan of Care Patient;Family member/caregiver   Family Member Consulted significant other      Patient will benefit from skilled therapeutic intervention in order to improve the following deficits and impairments:  Pain, Improper body mechanics, Postural dysfunction, Increased edema, Decreased strength, Hypomobility, Decreased endurance, Decreased activity tolerance, Decreased balance, Increased fascial restricitons, Decreased range of motion, Abnormal gait  Visit Diagnosis: Pain in right knee  Stiffness of right knee, not elsewhere  classified  Localized edema  Other abnormalities of gait and mobility     Problem List There are no active problems to display for this patient.   Caprice RedChasse, Elon Eoff M  PT 10/17/2015, 3:54 PM  [facility] [address] [place], [place], [address] Phone: [phone]   Fax:  [phone]  Name: Colleen GentaShguna  JeffersonBethann Wells MRN: [id number] Date of Birth: Nov 23, 1985

## 2015-10-21 ENCOUNTER — Ambulatory Visit: Payer: Medicaid Other | Admitting: Physical Therapy

## 2015-10-22 ENCOUNTER — Ambulatory Visit: Payer: Medicaid Other | Admitting: Physical Therapy

## 2015-10-22 DIAGNOSIS — R2689 Other abnormalities of gait and mobility: Secondary | ICD-10-CM

## 2015-10-22 DIAGNOSIS — R600 Localized edema: Secondary | ICD-10-CM

## 2015-10-22 DIAGNOSIS — M25561 Pain in right knee: Secondary | ICD-10-CM

## 2015-10-22 DIAGNOSIS — M25661 Stiffness of right knee, not elsewhere classified: Secondary | ICD-10-CM

## 2015-10-22 NOTE — Therapy (Signed)
[facility] [address] [place], [place], [address] Phone: [phone]   Fax:  [phone]  Physical Therapy Treatment  Patient Details  Name: Colleen Wells MRN: [id number] Date of Birth: March 17, 1985 Referring Provider: Cammy Copa MD  Encounter Date: 10/22/2015      PT End of Session - 10/22/15 1507    Visit Number 8   Number of Visits 13   Date for PT Re-Evaluation 11/13/15   Authorization Type following Medicaid approved visits, pt plans to be self pay.    PT Start Time 0302   PT Stop Time 0358   PT Time Calculation (min) 56 min      Past Medical History:  Diagnosis Date  . Arthrofibrosis of knee joint    right  . Medical history non-contributory   . Meniscus tear     Past Surgical History:  Procedure Laterality Date  . ANTERIOR CRUCIATE LIGAMENT REPAIR Right 09/03/2015   Procedure: REPAIR ANTERIOR CRUCIATE LIGAMENT  REPAIR OF AVULSION;  Surgeon: Cammy Copa, MD;  Location: [facility] OR;  Service: Orthopedics;  Laterality: Right;  . CESAREAN SECTION    . KNEE CLOSED REDUCTION Right 10/08/2015   Procedure: CLOSED MANIPULATION KNEE UNDER ANESTHESIA;  Surgeon: Cammy Copa, MD;  Location: [facility] OR;  Service: Orthopedics;  Laterality: Right;  . MENISCUS REPAIR Right 09/03/2015   Procedure: REPAIR OF MENISCUS LATERAL;  Surgeon: Cammy Copa, MD;  Location: [facility] OR;  Service: Orthopedics;  Laterality: Right;    There were no vitals filed for this visit.      Subjective Assessment - 10/22/15 1602    Subjective CPM is better now that I keep it at 90   Currently in Pain? Yes   Pain Score 4    Pain Location Knee   Pain Orientation Right            [facility] PT Assessment - 10/22/15 0001      AROM   Left Knee Flexion 90  on nustep     PROM   Right Knee Extension -12  after tx   Right Knee Flexion 94                     [facility] Adult PT Treatment/Exercise - 10/22/15 0001      Knee/Hip Exercises: Stretches   Passive Hamstring Stretch 3 reps;30 seconds   Passive Hamstring Stretch Limitations gentle contract relax   Gastroc Stretch 3 reps;30 seconds   Gastroc Stretch Limitations supine with strap    Other Knee/Hip Stretches prone knee felxion stretch with strap 3 x 30 seconds     Knee/Hip Exercises: Aerobic   Nustep UE/LE for 5 minutes ROM, the LE only level 3 focusing on smooth steps.      Knee/Hip Exercises: Seated   Long Arc Quad 20 reps   Other Seated Knee/Hip Exercises quad sets with heel on floor, tactile cues     Knee/Hip Exercises: Supine   Quad Sets Strengthening;Right  25 reps  WITH TACTILE AND VERBAL CUES.     Heel Slides AROM;Strengthening;1 set;10 reps  94 degrees   Heel Slides Limitations then hold 2 x 30 sec using strap     Knee/Hip Exercises: Prone   Hamstring Curl 20 reps     Vasopneumatic   Number Minutes Vasopneumatic  10 minutes   Vasopnuematic Location  Knee   Vasopneumatic Pressure Medium   Vasopneumatic Temperature  32     Manual Therapy   Passive ROM  flexion with forearm in popliteal space, then extension overpressure                  PT Short Term Goals - 10/17/15 1545      PT SHORT TERM GOAL #1   Title pt will be I with inital HEP (10/09/2015)   Status On-going     PT SHORT TERM GOAL #2   Title pt will be able to verbalize and demo techniques to reduce R knee pain and inflammation via RICE and HEP (10/09/2015)   Status Achieved           PT Long Term Goals - 10/14/15 1543      PT LONG TERM GOAL #1   Title pt will be I with advance HEP as of last visit ( 10/30/2015)   Time 6   Period Weeks   Status On-going     PT LONG TERM GOAL #2   Title pt will improve R knee flexion to >/= 90 degrees with </= 3/10 pain to promote functional and efficient gait pattern (10/30/2015)   Time 6   Period Weeks   Status On-going     PT LONG TERM GOAL #3   Title pt will be able to walk/ stand >/= 15 min and navigate >/=  10 sets reciprocally with =1 HHA on railing with no AD and </= 3/10 pain to promote functional mobility (10/30/2015)   Time 6   Period Weeks   Status On-going     PT LONG TERM GOAL #4   Title increase FOTO score to >/= 50% limited to demonstate improvement in function (10/30/2015)   Time 6   Period Weeks   Status Unable to assess     PT LONG TERM GOAL #5   Title increase R knee strenght to >/= 4-/5 with </=3/10 pain to promote safety with CKC activities (10/30/2015)   Time 6   Period Weeks   Status On-going               Plan - 10/22/15 1558    Clinical Impression Statement She reports less pain and edema today. Her ROM has improved. 94 degrees knee flexion.  We focused ROM and quad activation. Knee extension very weak, Passive extension -12. Asked pt to try prone flexion stretching with strap and supine calf stretching with strap. Increased pain today with PROM. Vaso for pain and edema at end of treatment.    PT Next Visit Plan ROM, stretch calf, quad activation, bike vs Nu-step vaso for swelling, FOTO?   PT Home Exercise Plan weight shifting forward/ laterally, quad set, heel slide with strap, hamstring stretch, SLR, LAQ, prone knee flexion with strap, calf stretch supine with strap      Patient will benefit from skilled therapeutic intervention in order to improve the following deficits and impairments:  Pain, Improper body mechanics, Postural dysfunction, Increased edema, Decreased strength, Hypomobility, Decreased endurance, Decreased activity tolerance, Decreased balance, Increased fascial restricitons, Decreased range of motion, Abnormal gait  Visit Diagnosis: Pain in right knee  Stiffness of right knee, not elsewhere classified  Localized edema  Other abnormalities of gait and mobility     Problem List There are no active problems to display for this patient.   Sherrie Mustacheonoho, Antaniya Venuti McGee, VirginiaPTA 10/22/2015, 4:12 PM  [facility] [address] [place], [place], [address] Phone: [phone]   Fax:  [phone]  Name: Colleen Wells MRN: [id number] Date of Birth: [birth year]

## 2015-10-23 ENCOUNTER — Ambulatory Visit: Payer: Medicaid Other | Admitting: Physical Therapy

## 2015-10-23 DIAGNOSIS — M25561 Pain in right knee: Secondary | ICD-10-CM

## 2015-10-23 DIAGNOSIS — M25661 Stiffness of right knee, not elsewhere classified: Secondary | ICD-10-CM

## 2015-10-23 DIAGNOSIS — R600 Localized edema: Secondary | ICD-10-CM

## 2015-10-23 NOTE — Therapy (Signed)
[facility] Outpatient [facility] [address] [place], [place], [address] Phone: [phone]   Fax:  [phone]  Physical Therapy Treatment  Patient Details  Name: Colleen Wells MRN: [id number] Date of Birth: 08/19/1985 Referring Provider: Cammy Copa MD  Encounter Date: 10/23/2015      PT End of Session - 10/23/15 1459    Visit Number 9   Number of Visits 13   Date for PT Re-Evaluation 11/13/15   Authorization Type following Medicaid approved visits, pt plans to be self pay.    PT Start Time 1330   PT Stop Time 1425   PT Time Calculation (min) 55 min   Activity Tolerance Patient tolerated treatment well   Behavior During Therapy WFL for tasks assessed/performed      Past Medical History:  Diagnosis Date  . Arthrofibrosis of knee joint    right  . Medical history non-contributory   . Meniscus tear     Past Surgical History:  Procedure Laterality Date  . ANTERIOR CRUCIATE LIGAMENT REPAIR Right 09/03/2015   Procedure: REPAIR ANTERIOR CRUCIATE LIGAMENT  REPAIR OF AVULSION;  Surgeon: Cammy Copa, MD;  Location: [facility] OR;  Service: Orthopedics;  Laterality: Right;  . CESAREAN SECTION    . KNEE CLOSED REDUCTION Right 10/08/2015   Procedure: CLOSED MANIPULATION KNEE UNDER ANESTHESIA;  Surgeon: Cammy Copa, MD;  Location: [facility] OR;  Service: Orthopedics;  Laterality: Right;  . MENISCUS REPAIR Right 09/03/2015   Procedure: REPAIR OF MENISCUS LATERAL;  Surgeon: Cammy Copa, MD;  Location: [facility] OR;  Service: Orthopedics;  Laterality: Right;    There were no vitals filed for this visit.      Subjective Assessment - 10/23/15 1338    Subjective "I put CPM at 100 this morning because 90-95 was getting easier to do"   Currently in Pain? Yes   Pain Score 3   to pain meds prior to treatment   Pain Location Knee   Pain Orientation Right            [facility] PT Assessment - 10/23/15 0001      PROM   Overall PROM  --   assessed following manual   Right Knee Extension -10   Right Knee Flexion 97                     [facility] Adult PT Treatment/Exercise - 10/23/15 1339      Knee/Hip Exercises: Stretches   Passive Hamstring Stretch 4 reps;3 reps  Museum/gallery conservator 4 reps;30 seconds   Other Knee/Hip Stretches prone knee felxion stretch with strap 4 x 30 seconds     Knee/Hip Exercises: Aerobic   Nustep UE/LE for 10 minutes ROM, UE/LE lower seat at 5 min  92 degrees flexion at end range     Knee/Hip Exercises: Standing   Other Standing Knee Exercises --   Other Standing Knee Exercises TKE using yellow theraband 2 x 10 with R weight shifting into extension   using +1 crutch for assistance      Knee/Hip Exercises: Seated   Long Arc Quad 20 reps   Hamstring Curl AROM;Strengthening;Right;2 sets;10 reps     Vasopneumatic   Number Minutes Vasopneumatic  15 minutes   Vasopnuematic Location  Knee   Vasopneumatic Pressure Medium   Vasopneumatic Temperature  32     Manual Therapy   Manual Therapy Joint mobilization   Joint Mobilization tibiofemoral A<>P Grade 3 mobs   Passive  ROM flexion with forearm in popliteal space, then extension overpressure                PT Education - 10/23/15 1418    Education provided Yes   Education Details TKE with weight shifting to R to work on knee extension and quad activation   Person(s) Educated Patient   Methods Explanation;Verbal cues   Comprehension Verbalized understanding;Verbal cues required          PT Short Term Goals - 10/17/15 1545      PT SHORT TERM GOAL #1   Title pt will be I with inital HEP (10/09/2015)   Status On-going     PT SHORT TERM GOAL #2   Title pt will be able to verbalize and demo techniques to reduce R knee pain and inflammation via RICE and HEP (10/09/2015)   Status Achieved           PT Long Term Goals - 10/14/15 1543      PT LONG TERM GOAL #1   Title pt will be I with advance HEP as  of last visit ( 10/30/2015)   Time 6   Period Weeks   Status On-going     PT LONG TERM GOAL #2   Title pt will improve R knee flexion to >/= 90 degrees with </= 3/10 pain to promote functional and efficient gait pattern (10/30/2015)   Time 6   Period Weeks   Status On-going     PT LONG TERM GOAL #3   Title pt will be able to walk/ stand >/= 15 min and navigate >/= 10 sets reciprocally with =1 HHA on railing with no AD and </= 3/10 pain to promote functional mobility (10/30/2015)   Time 6   Period Weeks   Status On-going     PT LONG TERM GOAL #4   Title increase FOTO score to >/= 50% limited to demonstate improvement in function (10/30/2015)   Time 6   Period Weeks   Status Unable to assess     PT LONG TERM GOAL #5   Title increase R knee strenght to >/= 4-/5 with </=3/10 pain to promote safety with CKC activities (10/30/2015)   Time 6   Period Weeks   Status On-going               Plan - 10/23/15 1500    Clinical Impression Statement pt continues to demonstrate improvement in knee ROM improving to 97 flexion following manual. conitnued to work on knee extension strengtheing and quad activation with weight shifting. conitnued vaso for pain and edema post session.    PT Next Visit Plan ROM, stretch calf, quad activation, bike vs Nu-step vaso for swelling, FOTO   PT Home Exercise Plan TKE with weight shifting to the R    Consulted and Agree with Plan of Care Patient      Patient will benefit from skilled therapeutic intervention in order to improve the following deficits and impairments:  Pain, Improper body mechanics, Postural dysfunction, Increased edema, Decreased strength, Hypomobility, Decreased endurance, Decreased activity tolerance, Decreased balance, Increased fascial restricitons, Decreased range of motion, Abnormal gait  Visit Diagnosis: Pain in right knee  Stiffness of right knee, not elsewhere classified  Localized edema     Problem List There are no active  problems to display for this patient.  Lulu RidingKristoffer Leamon PT, DPT, LAT, ATC  10/23/15  3:03 PM      [facility] [address] [place], [place], [address] Phone: [phone]   Fax:  [phone]  Name: Lenda Baratta MRN: [id number] Date of Birth: January 16, 1986

## 2015-10-24 ENCOUNTER — Ambulatory Visit: Payer: Medicaid Other | Admitting: Physical Therapy

## 2015-10-24 DIAGNOSIS — R2689 Other abnormalities of gait and mobility: Secondary | ICD-10-CM

## 2015-10-24 DIAGNOSIS — M25561 Pain in right knee: Secondary | ICD-10-CM | POA: Diagnosis not present

## 2015-10-24 DIAGNOSIS — R600 Localized edema: Secondary | ICD-10-CM

## 2015-10-24 DIAGNOSIS — M25661 Stiffness of right knee, not elsewhere classified: Secondary | ICD-10-CM

## 2015-10-24 NOTE — Therapy (Signed)
[facility] Outpatient Rehabilitation [facility] [address] [place], [place], [address] Phone: [phone]   Fax:  [phone]  Physical Therapy Treatment  Patient Details  Name: Colleen Wells MRN: [id number] Date of Birth: [birth year] Referring Provider: Cammy Copa MD  Encounter Date: Wells      PT End of Session - 10/24/15 1333    Visit Number 10   Number of Visits 13   Date for PT Re-Evaluation 11/13/15   Authorization Type following Medicaid approved visits, pt plans to be self pay.    Authorization - Visit Number 4   Authorization - Number of Visits 4   PT Start Time 0128   PT Stop Time 0230   PT Time Calculation (min) 62 min      Past Medical History:  Diagnosis Date  . Arthrofibrosis of knee joint    right  . Medical history non-contributory   . Meniscus tear     Past Surgical History:  Procedure Laterality Date  . ANTERIOR CRUCIATE LIGAMENT REPAIR Right 09/03/2015   Procedure: REPAIR ANTERIOR CRUCIATE LIGAMENT  REPAIR OF AVULSION;  Surgeon: Cammy Copa, MD;  Location: [facility] OR;  Service: Orthopedics;  Laterality: Right;  . CESAREAN SECTION    . KNEE CLOSED REDUCTION Right 10/08/2015   Procedure: CLOSED MANIPULATION KNEE UNDER ANESTHESIA;  Surgeon: Cammy Copa, MD;  Location: [facility] OR;  Service: Orthopedics;  Laterality: Right;  . MENISCUS REPAIR Right 09/03/2015   Procedure: REPAIR OF MENISCUS LATERAL;  Surgeon: Cammy Copa, MD;  Location: [facility] OR;  Service: Orthopedics;  Laterality: Right;    There were no vitals filed for this visit.      Subjective Assessment - 10/24/15 1333    Currently in Pain? Yes   Pain Score 4    Pain Location Knee   Pain Orientation Right   Aggravating Factors  bending, bearing weight   Pain Relieving Factors ice meds            [facility] PT Assessment - 10/24/15 0001      Observation/Other Assessments   Focus on Therapeutic Outcomes (FOTO)  56% improved from 60% limited from eval      AROM   Left Knee Flexion 97     PROM   Right Knee Extension -10   Right Knee Flexion 102                     [facility] Adult PT Treatment/Exercise - 10/24/15 0001      Ambulation/Gait   Pre-Gait Activities retro walking for TKE, weight shifting forward and back focusing heel strike and toe off.      Knee/Hip Exercises: Stretches   Passive Hamstring Stretch 3 reps;30 seconds   Gastroc Stretch 5 reps;10 seconds   Gastroc Stretch Limitations supine with knee over pressure   Other Knee/Hip Stretches prone knee felxion stretch with strap 3 x 30 seconds     Knee/Hip Exercises: Aerobic   Recumbent Bike 6 min      Knee/Hip Exercises: Standing   Other Standing Knee Exercises TKE using yellow theraband 2 x 10 with R weight shifting into extension , also yellow ball against wall   using +1 crutch for assistance      Knee/Hip Exercises: Supine   Quad Sets Strengthening;Right  25 reps  WITH TACTILE AND VERBAL CUES.     Short Arc [employer] reps   Terminal Knee Extension 20 reps;Theraband   Theraband Level (Terminal Knee Extension) Level 1 (Yellow)  Straight Leg Raises 10 reps     Knee/Hip Exercises: Prone   Hamstring Curl 20 reps     Vasopneumatic   Number Minutes Vasopneumatic  15 minutes   Vasopnuematic Location  Knee   Vasopneumatic Pressure Medium   Vasopneumatic Temperature  32     Manual Therapy   Manual therapy comments A/P  glides gr 2-3   Passive ROM flexion with forearm in popliteal space, then extension overpressure                PT Education - 10/23/15 1418    Education provided Yes   Education Details TKE with weight shifting to R to work on knee extension and quad activation   Person(s) Educated Patient   Methods Explanation;Verbal cues   Comprehension Verbalized understanding;Verbal cues required          PT Short Term Goals - 10/17/15 1545      PT SHORT TERM GOAL #1   Title pt will be I with inital HEP (10/09/2015)    Status On-going     PT SHORT TERM GOAL #2   Title pt will be able to verbalize and demo techniques to reduce R knee pain and inflammation via RICE and HEP (10/09/2015)   Status Achieved           PT Long Term Goals - 10/14/15 1543      PT LONG TERM GOAL #1   Title pt will be I with advance HEP as of last visit ( 10/30/2015)   Time 6   Period Weeks   Status On-going     PT LONG TERM GOAL #2   Title pt will improve R knee flexion to >/= 90 degrees with </= 3/10 pain to promote functional and efficient gait pattern (10/30/2015)   Time 6   Period Weeks   Status On-going     PT LONG TERM GOAL #3   Title pt will be able to walk/ stand >/= 15 min and navigate >/= 10 sets reciprocally with =1 HHA on railing with no AD and </= 3/10 pain to promote functional mobility (10/30/2015)   Time 6   Period Weeks   Status On-going     PT LONG TERM GOAL #4   Title increase FOTO score to >/= 50% limited to demonstate improvement in function (10/30/2015)   Time 6   Period Weeks   Status Unable to assess     PT LONG TERM GOAL #5   Title increase R knee strenght to >/= 4-/5 with </=3/10 pain to promote safety with CKC activities (10/30/2015)   Time 6   Period Weeks   Status On-going               Plan - 10/24/15 1518    Clinical Impression Statement Motion continues to improve. We worked on quad activation in standing via retro walking and TKE. She tolerated manual and stretching well. FOTO score improved.    PT Next Visit Plan ROM, stretch calf, quad activation in standing, bike vs Nu-step vaso for swelling, estim?      Patient will benefit from skilled therapeutic intervention in order to improve the following deficits and impairments:  Pain, Improper body mechanics, Postural dysfunction, Increased edema, Decreased strength, Hypomobility, Decreased endurance, Decreased activity tolerance, Decreased balance, Increased fascial restricitons, Decreased range of motion, Abnormal gait  Visit  Diagnosis: Pain in right knee  Stiffness of right knee, not elsewhere classified  Localized edema  Other abnormalities of gait and mobility  Problem List There are no active problems to display for this patient.   Colleen Wells, Colleen Wells, Colleen Wells, 3:25 PM  [facility] [address] [place], [place], [address] Phone: [phone]   Fax:  [phone]  Name: Colleen Wells MRN: [id number] Date of Birth: September 17, 1985

## 2015-10-25 ENCOUNTER — Ambulatory Visit: Payer: Medicaid Other | Attending: Orthopedic Surgery | Admitting: Physical Therapy

## 2015-10-25 DIAGNOSIS — R600 Localized edema: Secondary | ICD-10-CM | POA: Insufficient documentation

## 2015-10-25 DIAGNOSIS — R2689 Other abnormalities of gait and mobility: Secondary | ICD-10-CM | POA: Insufficient documentation

## 2015-10-25 DIAGNOSIS — M25661 Stiffness of right knee, not elsewhere classified: Secondary | ICD-10-CM | POA: Diagnosis present

## 2015-10-25 DIAGNOSIS — M25561 Pain in right knee: Secondary | ICD-10-CM | POA: Diagnosis not present

## 2015-10-25 NOTE — Therapy (Signed)
[facility] [address] [place], [place], [address] Phone: [phone]   Fax:  [phone]  Physical Therapy Treatment  Patient Details  Name: Colleen Wells MRN: [id number] Date of Birth: 1985-07-17 Referring Provider: Cammy Copa MD  Encounter Date: 10/25/2015      PT End of Session - 10/25/15 1203    Visit Number 11   Number of Visits 13   Date for PT Re-Evaluation 11/13/15   Authorization Type Self-pay   PT Start Time 1110   PT Stop Time 1214   PT Time Calculation (min) 64 min   Activity Tolerance Patient tolerated treatment well   Behavior During Therapy [facility] for tasks assessed/performed      Past Medical History:  Diagnosis Date  . Arthrofibrosis of knee joint    right  . Medical history non-contributory   . Meniscus tear     Past Surgical History:  Procedure Laterality Date  . ANTERIOR CRUCIATE LIGAMENT REPAIR Right 09/03/2015   Procedure: REPAIR ANTERIOR CRUCIATE LIGAMENT  REPAIR OF AVULSION;  Surgeon: Cammy Copa, MD;  Location: [facility] OR;  Service: Orthopedics;  Laterality: Right;  . CESAREAN SECTION    . KNEE CLOSED REDUCTION Right 10/08/2015   Procedure: CLOSED MANIPULATION KNEE UNDER ANESTHESIA;  Surgeon: Cammy Copa, MD;  Location: [facility] OR;  Service: Orthopedics;  Laterality: Right;  . MENISCUS REPAIR Right 09/03/2015   Procedure: REPAIR OF MENISCUS LATERAL;  Surgeon: Cammy Copa, MD;  Location: [facility] OR;  Service: Orthopedics;  Laterality: Right;    There were no vitals filed for this visit.      Subjective Assessment - 10/25/15 1114    Subjective "I am feeling off today, not just my knee but overall"   Currently in Pain? Yes   Pain Score 4   took pain meds prior to treatment   Pain Location Knee   Pain Orientation Right   Pain Descriptors / Indicators Sore   Pain Frequency Intermittent            [facility] PT Assessment - 10/25/15 0001      AROM   Left Knee Flexion 100      PROM   Right Knee Extension -10   Right Knee Flexion 103                     [facility] Adult PT Treatment/Exercise - 10/25/15 1115      Knee/Hip Exercises: Aerobic   Recumbent Bike x 8 min lowering seat every 2 min     Knee/Hip Exercises: Standing   Other Standing Knee Exercises TKE using red theraband 2 x 10 with R weight shifting into extension , also yellow ball against wall      Modalities   Modalities Doctor, general practice Location R knee   Statistician Action Russian   Electrical Stimulation Parameters 2 sec ramp, 10/10, L 50 x 10 min  with quad set   Electrical Stimulation Goals Strength;Neuromuscular facilitation     Vasopneumatic   Number Minutes Vasopneumatic  10 minutes   Vasopnuematic Location  Knee   Vasopneumatic Pressure Medium   Vasopneumatic Temperature  32     Manual Therapy   Manual therapy comments A/P  glides gr 2-3, in sitting and supine   Passive ROM flexion with forearm in popliteal space, then extension overpressure                PT  Education - 10/25/15 1203    Education provided Yes   Education Details benefits of E-stim to facilitate quad activation to improve knee extension.   Person(s) Educated Patient   Methods Explanation;Verbal cues   Comprehension Verbalized understanding;Verbal cues required          PT Short Term Goals - 10/17/15 1545      PT SHORT TERM GOAL #1   Title pt will be I with inital HEP (10/09/2015)   Status On-going     PT SHORT TERM GOAL #2   Title pt will be able to verbalize and demo techniques to reduce R knee pain and inflammation via RICE and HEP (10/09/2015)   Status Achieved           PT Long Term Goals - 10/14/15 1543      PT LONG TERM GOAL #1   Title pt will be I with advance HEP as of last visit ( 10/30/2015)   Time 6   Period Weeks   Status On-going     PT LONG TERM GOAL #2   Title pt will improve R knee  flexion to >/= 90 degrees with </= 3/10 pain to promote functional and efficient gait pattern (10/30/2015)   Time 6   Period Weeks   Status On-going     PT LONG TERM GOAL #3   Title pt will be able to walk/ stand >/= 15 min and navigate >/= 10 sets reciprocally with =1 HHA on railing with no AD and </= 3/10 pain to promote functional mobility (10/30/2015)   Time 6   Period Weeks   Status On-going     PT LONG TERM GOAL #4   Title increase FOTO score to >/= 50% limited to demonstate improvement in function (10/30/2015)   Time 6   Period Weeks   Status Unable to assess     PT LONG TERM GOAL #5   Title increase R knee strenght to >/= 4-/5 with </=3/10 pain to promote safety with CKC activities (10/30/2015)   Time 6   Period Weeks   Status On-going               Plan - 10/25/15 1204    Clinical Impression Statement Colleen Wells continues to progress with Knee AROM to 100 degrees of AROM flexion but continues to demonstrate -15 degrees of active extension. Following manual and retro stepping utilized  [place] stim to facilitate quad contraction and improve knee extension. continued Vaso for pain and swelling.    PT Next Visit Plan assess response to [place], ROM, stretch calf, quad activation in standing, bike vs Nu-step vaso for swelling   PT Home Exercise Plan updated resistance for TKE   Consulted and Agree with Plan of Care Patient;Family member/caregiver   Family Member Consulted significant other      Patient will benefit from skilled therapeutic intervention in order to improve the following deficits and impairments:  Pain, Improper body mechanics, Postural dysfunction, Increased edema, Decreased strength, Hypomobility, Decreased endurance, Decreased activity tolerance, Decreased balance, Increased fascial restricitons, Decreased range of motion, Abnormal gait  Visit Diagnosis: Pain in right knee  Stiffness of right knee, not elsewhere classified  Localized edema  Other  abnormalities of gait and mobility     Problem List There are no active problems to display for this patient.  Lulu Riding PT, DPT, LAT, ATC  10/25/15  12:18 PM      [facility] [address] [place],  [place], [address] Phone: [phone]   Fax:  [phone]  Name: Colleen Wells MRN: [id number] Date of Birth: 1985/11/09

## 2015-10-29 ENCOUNTER — Ambulatory Visit: Payer: Medicaid Other | Admitting: Physical Therapy

## 2015-10-29 DIAGNOSIS — M25661 Stiffness of right knee, not elsewhere classified: Secondary | ICD-10-CM

## 2015-10-29 DIAGNOSIS — R2689 Other abnormalities of gait and mobility: Secondary | ICD-10-CM

## 2015-10-29 DIAGNOSIS — R600 Localized edema: Secondary | ICD-10-CM

## 2015-10-29 DIAGNOSIS — M25561 Pain in right knee: Secondary | ICD-10-CM

## 2015-10-29 NOTE — Therapy (Signed)
[facility] [address] [place], [place], [address] Phone: [phone]   Fax:  [phone]  Physical Therapy Treatment  Patient Details  Name: Colleen PaciniShguna Wells MRN: [id number] Date of Birth: [birth year] Referring Provider: Cammy CopaGregory Scott Dean MD  Encounter Date: 10/29/2015      PT End of Session - 10/29/15 1509    Visit Number 12   Number of Visits 13   Date for PT Re-Evaluation 11/13/15   Authorization Type Self-pay / Medicaid not authorized   PT Start Time 0306   PT Stop Time 0411   PT Time Calculation (min) 65 min      Past Medical History:  Diagnosis Date  . Arthrofibrosis of knee joint    right  . Medical history non-contributory   . Meniscus tear     Past Surgical History:  Procedure Laterality Date  . ANTERIOR CRUCIATE LIGAMENT REPAIR Right 09/03/2015   Procedure: REPAIR ANTERIOR CRUCIATE LIGAMENT  REPAIR OF AVULSION;  Surgeon: Cammy CopaScott Gregory Dean, MD;  Location: [facility] OR;  Service: Orthopedics;  Laterality: Right;  . CESAREAN SECTION    . KNEE CLOSED REDUCTION Right 10/08/2015   Procedure: CLOSED MANIPULATION KNEE UNDER ANESTHESIA;  Surgeon: Cammy CopaScott Gregory Dean, MD;  [facility];  Service: Orthopedics;  Laterality: Right;  . MENISCUS REPAIR Right 09/03/2015   Procedure: REPAIR OF MENISCUS LATERAL;  Surgeon: Cammy CopaScott Gregory Dean, MD;  [facility];  Service: Orthopedics;  Laterality: Right;    There were no vitals filed for this visit.          [facility] - 10/29/15 0001      PROM   Right Knee Extension -11                     [facility] Adult PT Treatment/Exercise - 10/29/15 0001      Knee/Hip Exercises: Stretches   Active Hamstring Stretch Limitations prone knee hang 3# x 5 minutes right     Knee/Hip Exercises: Aerobic   Nustep LE only level 5 x 7 minutes      Knee/Hip Exercises: Standing   Other Standing Knee Exercises retro walking and forward/backward weight shifting/ roll  through, resisted gait 2 plates retro x 4 passes   Other Standing Knee Exercises TKE into ball on wall     Knee/Hip Exercises: Supine   Quad Sets Limitations during russian stim   Short Arc [employer] Tim[employer] CompanyQuad Sets Limitations during Guernseyussian Stim    Straight Leg Raises Limitations during Psychologist, prison and probation servicesrussian stim     Electrical Stimulation   Electrical Stimulation Location R knee   Electrical Stimulation Parameters L 26 10/10 , 2 sec ramp    Electrical Stimulation Goals Strength;Neuromuscular facilitation     Vasopneumatic   Number Minutes Vasopneumatic  10 minutes   Vasopnuematic Location  Knee   Vasopneumatic Temperature  32                  PT Short Term Goals - 10/17/15 1545      PT SHORT TERM GOAL #1   Title pt will be I with inital HEP (10/09/2015)   Status On-going     PT SHORT TERM GOAL #2   Title pt will be able to verbalize and demo techniques to reduce R knee pain and inflammation via RICE and HEP (10/09/2015)   Status Achieved           PT Long Term Goals - 10/14/15 1543      PT LONG TERM  GOAL #1   Title pt will be I with advance HEP as of last visit ( 10/30/2015)   Time 6   Period Weeks   Status On-going     PT LONG TERM GOAL #2   Title pt will improve R knee flexion to >/= 90 degrees with </= 3/10 pain to promote functional and efficient gait pattern (10/30/2015)   Time 6   Period Weeks   Status On-going     PT LONG TERM GOAL #3   Title pt will be able to walk/ stand >/= 15 min and navigate >/= 10 sets reciprocally with =1 HHA on railing with no AD and </= 3/10 pain to promote functional mobility (10/30/2015)   Time 6   Period Weeks   Status On-going     PT LONG TERM GOAL #4   Title increase FOTO score to >/= 50% limited to demonstate improvement in function (10/30/2015)   Time 6   Period Weeks   Status Unable to assess     PT LONG TERM GOAL #5   Title increase R knee strenght to >/= 4-/5 with </=3/10 pain to promote safety with CKC activities (10/30/2015)   Time 6    Period Weeks   Status On-going               Plan - 10/29/15 1647    Clinical Impression Statement Pt enters without AD. She brought one crutch just in case. Cues for equal steps and heel strike/ toe off. Focused knee extension. Repeated Russin to stimulate quad. Pt demonstrates weak active quad contraction. Focued chain exercises to activate quad as well as supine exercises with [place] stim. -11 knee extension supine today.    PT Next Visit Plan continue [place], ROM, stretch calf, quad activation in standing, bike vs Nu-step vaso for swelling-TRY KT tape for edema      Patient will benefit from skilled therapeutic intervention in order to improve the following deficits and impairments:  Pain, Improper body mechanics, Postural dysfunction, Increased edema, Decreased strength, Hypomobility, Decreased endurance, Decreased activity tolerance, Decreased balance, Increased fascial restricitons, Decreased range of motion, Abnormal gait  Visit Diagnosis: Pain in right knee  Stiffness of right knee, not elsewhere classified  Localized edema  Other abnormalities of gait and mobility     Problem List There are no active problems to display for this patient.   Sherrie Mustache, [place] 10/29/2015, 4:52 PM  [facility] [address] [place], [place], [address] Phone: [phone]   Fax:  [phone]  Name: Colleen Wells MRN: [id number] Date of Birth: 03-Nov-1985

## 2015-10-30 ENCOUNTER — Ambulatory Visit: Payer: Medicaid Other | Admitting: Physical Therapy

## 2015-10-30 DIAGNOSIS — M25561 Pain in right knee: Secondary | ICD-10-CM

## 2015-10-30 DIAGNOSIS — M25661 Stiffness of right knee, not elsewhere classified: Secondary | ICD-10-CM

## 2015-10-30 DIAGNOSIS — R2689 Other abnormalities of gait and mobility: Secondary | ICD-10-CM

## 2015-10-30 DIAGNOSIS — R600 Localized edema: Secondary | ICD-10-CM

## 2015-10-30 NOTE — Therapy (Signed)
[facility] [address] [place], [place], [address] Phone: [phone]   Fax:  [phone]  Physical Therapy Treatment / Re-certification  Patient Details  Name: Colleen Wells MRN: [id number] Date of Birth: Jul 16, 1985 Referring Provider: Cammy Copa MD  Encounter Date: 10/30/2015      PT End of Session - 10/30/15 1307    Visit Number 13   Number of Visits 25   Date for PT Re-Evaluation 12/25/15   Authorization Type Self-pay / Medicaid not authorized   PT Start Time 1102   PT Stop Time 1200   PT Time Calculation (min) 58 min   Activity Tolerance Patient tolerated treatment well   Behavior During Therapy [facility] for tasks assessed/performed      Past Medical History:  Diagnosis Date  . Arthrofibrosis of knee joint    right  . Medical history non-contributory   . Meniscus tear     Past Surgical History:  Procedure Laterality Date  . ANTERIOR CRUCIATE LIGAMENT REPAIR Right 09/03/2015   Procedure: REPAIR ANTERIOR CRUCIATE LIGAMENT  REPAIR OF AVULSION;  Surgeon: Cammy Copa, MD;  Location: [facility] OR;  Service: Orthopedics;  Laterality: Right;  . CESAREAN SECTION    . KNEE CLOSED REDUCTION Right 10/08/2015   Procedure: CLOSED MANIPULATION KNEE UNDER ANESTHESIA;  Surgeon: Cammy Copa, MD;  Location: [facility] OR;  Service: Orthopedics;  Laterality: Right;  . MENISCUS REPAIR Right 09/03/2015   Procedure: REPAIR OF MENISCUS LATERAL;  Surgeon: Cammy Copa, MD;  [facility] OR;  Service: Orthopedics;  Laterality: Right;    There were no vitals filed for this visit.      Subjective Assessment - 10/30/15 1104    Subjective "Just feeling sore in the front and some aching in the sides of the knee, CPM is up to 115 degrees"    Currently in Pain? Yes   Pain Score 3   took pain meds prior to treatment   Pain Location Knee   Pain Orientation Right   Pain Type Surgical pain   Pain Onset 1 to 4 weeks ago   Pain  Frequency Intermittent            [facility] PT Assessment - 10/30/15 1110      AROM   Right Knee Extension -9   Right Knee Flexion 92     PROM   Right Knee Extension -7   Right Knee Flexion 105                     [facility] Adult PT Treatment/Exercise - 10/30/15 0001      Knee/Hip Exercises: Supine   Quad Sets Limitations during russian x 5 min   Straight Leg Raises Limitations during russian stim, pushing the knee down and performing SLR  -21 extensor lag during SLR     Programme researcher, broadcasting/film/video Location R knee   Statistician Action Academic librarian Parameters L 53 10/10 x 10 min with 2 sec ramp   Electrical Stimulation Goals Strength;Neuromuscular facilitation     Vasopneumatic   Number Minutes Vasopneumatic  10 minutes   Vasopnuematic Location  Knee   Vasopneumatic Pressure Medium   Vasopneumatic Temperature  32     Manual Therapy   Manual therapy comments retrograde/ antegrade edema reduction massage   Joint Mobilization A/P  glides gr 2-3, in sitting and supine   Passive ROM flexion with forearm in popliteal space, then extension overpressure  [employer]   Edema R knee                PT Education - 10/30/15 1307    Education provided Yes   Education Details benefits of retro/ antegrade massage to reduce swelling to work on improving motion.   Person(s) Educated Patient   Methods Explanation;Demonstration;Verbal cues   Comprehension Verbalized understanding;Verbal cues required          PT Short Term Goals - 10/30/15 1313      PT SHORT TERM GOAL #1   Title pt will be I with inital HEP (10/09/2015)   Time 3   Period Weeks   Status Achieved     PT SHORT TERM GOAL #2   Title pt will be able to verbalize and demo techniques to reduce R knee pain and inflammation via RICE and HEP (10/09/2015)   Period Weeks   Status Achieved           PT Long Term Goals - 10/30/15 1313      PT  LONG TERM GOAL #1   Title pt will be I with advance HEP as of last visit ( 10/30/2015)   Time 6   Period Weeks   Status On-going     PT LONG TERM GOAL #2   Title pt will improve R knee flexion to >/= 90 degrees with </= 3/10 pain to promote functional and efficient gait pattern (10/30/2015)   Time 6   Period Weeks   Status On-going     PT LONG TERM GOAL #3   Title pt will be able to walk/ stand >/= 15 min and navigate >/= 10 sets reciprocally with =1 HHA on railing with no AD and </= 3/10 pain to promote functional mobility (10/30/2015)   Time 6   Period Weeks   Status On-going     PT LONG TERM GOAL #4   Title increase FOTO score to >/= 50% limited to demonstate improvement in function (10/30/2015)   Time 6   Period Weeks   Status Unable to assess     PT LONG TERM GOAL #5   Title increase R knee strenght to >/= 4-/5 with </=3/10 pain to promote safety with CKC activities (10/30/2015)   Time 6   Period Weeks   Status On-going               Plan - 10/30/15 1309    Clinical Impression Statement [name] continues to progress with therapy improving AROM to -9 degrees extension and 93 degrees flexion following todays session. Continued Russion to work on quad contraction and faciilate knee extension. worked on edema reduciton techniques as well as KT taping to increase space. plan to continue with current POC to work toward remaining goals.    Rehab Potential Good   PT Frequency 5x / week   PT Duration 8 weeks   PT Next Visit Plan continue Guernseyussian, ROM, stretch calf, quad activation in standing, bike vs Nu-step vaso for swelling, assess response to KT tape   PT Home Exercise Plan antegrade/ retrograde massage   Consulted and Agree with Plan of Care Patient;Family member/caregiver   Family Member Consulted significant other      Patient will benefit from skilled therapeutic intervention in order to improve the following deficits and impairments:  Pain, Improper body  mechanics, Postural dysfunction, Increased edema, Decreased strength, Hypomobility, Decreased endurance, Decreased activity tolerance, Decreased balance, Increased fascial restricitons, Decreased range of motion, Abnormal gait  Visit Diagnosis: Pain  in right knee - Plan: PT plan of care cert/re-cert  Stiffness of right knee, not elsewhere classified - Plan: PT plan of care cert/re-cert  Localized edema - Plan: PT plan of care cert/re-cert  Other abnormalities of gait and mobility - Plan: PT plan of care cert/re-cert     Problem List There are no active problems to display for this patient.  Lulu Riding PT, DPT, LAT, ATC  10/30/15  1:16 PM      [facility] [address] [place], [place], [address] Phone: [phone]   Fax:  [phone]  Name: Colleen Wells MRN: [id number] Date of Birth: 10-05-1985

## 2015-10-31 ENCOUNTER — Ambulatory Visit: Payer: Medicaid Other | Admitting: Physical Therapy

## 2015-10-31 DIAGNOSIS — M25661 Stiffness of right knee, not elsewhere classified: Secondary | ICD-10-CM

## 2015-10-31 DIAGNOSIS — R2689 Other abnormalities of gait and mobility: Secondary | ICD-10-CM

## 2015-10-31 DIAGNOSIS — M25561 Pain in right knee: Secondary | ICD-10-CM | POA: Diagnosis not present

## 2015-10-31 DIAGNOSIS — R600 Localized edema: Secondary | ICD-10-CM

## 2015-10-31 NOTE — Therapy (Signed)
[facility] [address] [place], [place], [address] Phone: [phone]   Fax:  [phone]  Physical Therapy Treatment  Patient Details  Name: Colleen Wells MRN: [id number] Date of Birth: 1985-05-24 Referring Provider: Cammy CopaGregory Scott Dean MD  Encounter Date: 10/31/2015      PT End of Session - 10/31/15 1743    Visit Number 14   Number of Visits 25   Date for PT Re-Evaluation 12/25/15   Authorization Type Self-pay / Medicaid not authorized   PT Start Time 1505   PT Stop Time 1558   PT Time Calculation (min) 53 min   Activity Tolerance Patient tolerated treatment well   Behavior During Therapy [facility] for tasks assessed/performed      Past Medical History:  Diagnosis Date  . Arthrofibrosis of knee joint    right  . Medical history non-contributory   . Meniscus tear     Past Surgical History:  Procedure Laterality Date  . ANTERIOR CRUCIATE LIGAMENT REPAIR Right 09/03/2015   Procedure: REPAIR ANTERIOR CRUCIATE LIGAMENT  REPAIR OF AVULSION;  Surgeon: Cammy CopaScott Gregory Dean, MD;  Location: [facility] OR;  Service: Orthopedics;  Laterality: Right;  . CESAREAN SECTION    . KNEE CLOSED REDUCTION Right 10/08/2015   Procedure: CLOSED MANIPULATION KNEE UNDER ANESTHESIA;  Surgeon: Cammy CopaScott Gregory Dean, MD;  [facility];  Service: Orthopedics;  Laterality: Right;  . MENISCUS REPAIR Right 09/03/2015   Procedure: REPAIR OF MENISCUS LATERAL;  Surgeon: Cammy CopaScott Gregory Dean, MD;  [facility];  Service: Orthopedics;  Laterality: Right;    There were no vitals filed for this visit.      Subjective Assessment - 10/31/15 1504    Subjective "The swelling feels like it went down alot since last session"   Currently in Pain? Yes   Pain Score 1    Pain Frequency Intermittent   Aggravating Factors  N/A   Pain Relieving Factors Ice, meds,             [facility] PT Assessment - 10/31/15 0001      AROM   Right Knee Extension -13   Right Knee  Flexion 102     PROM   Right Knee Extension -8                     [facility] Adult PT Treatment/Exercise - 10/31/15 1509      Knee/Hip Exercises: Stretches   Active Hamstring Stretch 3 reps;30 seconds  contract/ relax with 10 sec contraction     Knee/Hip Exercises: Aerobic   Recumbent Bike L 3 x 10 min, lowering seat every 2 min  to increase knee flexion     Knee/Hip Exercises: Standing   Other Standing Knee Exercises retro walking and forward/backward weight shifting/ roll through, resisted gait 2 plates retro x 4 passes  with pertubations    Other Standing Knee Exercises TKE into ball on wall     Knee/Hip Exercises: Seated   Long Arc Quad AROM;Strengthening;2 sets;10 reps     Vasopneumatic   Number Minutes Vasopneumatic  10 minutes   Vasopnuematic Location  Knee   Vasopneumatic Pressure Medium   Vasopneumatic Temperature  32  with 5# across knee to assist with extensoin     Manual Therapy   Joint Mobilization A/P  glides gr 2-3, in sitting and supine, anterior glide with distraction while applying gentle external rotational mob   with belt to assist with translation  PT Education - 10/30/15 1307    Education provided Yes   Education Details benefits of retro/ antegrade massage to reduce swelling to work on improving motion.   Person(s) Educated Patient   Methods Explanation;Demonstration;Verbal cues   Comprehension Verbalized understanding;Verbal cues required          PT Short Term Goals - 10/30/15 1313      PT SHORT TERM GOAL #1   Title pt will be I with inital HEP (10/09/2015)   Time 3   Period Weeks   Status Achieved     PT SHORT TERM GOAL #2   Title pt will be able to verbalize and demo techniques to reduce R knee pain and inflammation via RICE and HEP (10/09/2015)   Period Weeks   Status Achieved           PT Long Term Goals - 10/30/15 1313      PT LONG TERM GOAL #1   Title pt will be I with advance HEP as of  last visit ( 10/30/2015)   Time 6   Period Weeks   Status On-going     PT LONG TERM GOAL #2   Title pt will improve R knee flexion to >/= 90 degrees with </= 3/10 pain to promote functional and efficient gait pattern (10/30/2015)   Time 6   Period Weeks   Status On-going     PT LONG TERM GOAL #3   Title pt will be able to walk/ stand >/= 15 min and navigate >/= 10 sets reciprocally with =1 HHA on railing with no AD and </= 3/10 pain to promote functional mobility (10/30/2015)   Time 6   Period Weeks   Status On-going     PT LONG TERM GOAL #4   Title increase FOTO score to >/= 50% limited to demonstate improvement in function (10/30/2015)   Time 6   Period Weeks   Status Unable to assess     PT LONG TERM GOAL #5   Title increase R knee strenght to >/= 4-/5 with </=3/10 pain to promote safety with CKC activities (10/30/2015)   Time 6   Period Weeks   Status On-going               Plan - 10/31/15 1744    Clinical Impression Statement pt increased AROM flexion to 102 but continues to demonstrate initial extension limitation of -19 that progress with treatment. Following anterior mobs with gentle external rotation to which she was able to get to -7 of PROM extension. Continued VASO for swelling but utilized weigth acrossed top of knee to assist with extension.    PT Next Visit Plan continue [place], ROM, stretch calf, quad activation in standing, bike vs Nu-step vaso for swelling, assess response to KT tape   Consulted and Agree with Plan of Care Patient      Patient will benefit from skilled therapeutic intervention in order to improve the following deficits and impairments:  Pain, Improper body mechanics, Postural dysfunction, Increased edema, Decreased strength, Hypomobility, Decreased endurance, Decreased activity tolerance, Decreased balance, Increased fascial restricitons, Decreased range of motion, Abnormal gait  Visit Diagnosis: Pain in right knee  Localized  edema  Stiffness of right knee, not elsewhere classified  Other abnormalities of gait and mobility     Problem List There are no active problems to display for this patient.  Lulu Riding PT, DPT, LAT, ATC  10/31/15  5:49 PM       [facility] [address] [place], [place], [address] Phone: [phone]   Fax:  [phone]  Name: Colleen Wells MRN: [id number] Date of Birth: March 12, 1985

## 2015-11-01 ENCOUNTER — Ambulatory Visit: Payer: Medicaid Other | Admitting: Physical Therapy

## 2015-11-01 DIAGNOSIS — M25561 Pain in right knee: Secondary | ICD-10-CM

## 2015-11-01 DIAGNOSIS — R600 Localized edema: Secondary | ICD-10-CM

## 2015-11-01 DIAGNOSIS — M25661 Stiffness of right knee, not elsewhere classified: Secondary | ICD-10-CM

## 2015-11-01 DIAGNOSIS — R2689 Other abnormalities of gait and mobility: Secondary | ICD-10-CM

## 2015-11-01 NOTE — Therapy (Signed)
[facility] [address] [place], [place], [address] Phone: [phone]   Fax:  [phone]  Physical Therapy Treatment  Patient Details  Name: Colleen Wells MRN: [id number] Date of Birth: 1985-04-28 Referring Provider: Cammy CopaGregory Scott Dean MD  Encounter Date: 11/01/2015      PT End of Session - 10/31/15 1743    Visit Number 14   Number of Visits 25   Date for PT Re-Evaluation 12/25/15   Authorization Type Self-pay / Medicaid not authorized   PT Start Time 1505   PT Stop Time 1558   PT Time Calculation (min) 53 min   Activity Tolerance Patient tolerated treatment well   Behavior During Therapy [facility] for tasks assessed/performed      Past Medical History:  Diagnosis Date  . Arthrofibrosis of knee joint    right  . Medical history non-contributory   . Meniscus tear     Past Surgical History:  Procedure Laterality Date  . ANTERIOR CRUCIATE LIGAMENT REPAIR Right 09/03/2015   Procedure: REPAIR ANTERIOR CRUCIATE LIGAMENT  REPAIR OF AVULSION;  Surgeon: Cammy CopaScott Gregory Dean, MD;  Location: [facility];  Service: Orthopedics;  Laterality: Right;  . CESAREAN SECTION    . KNEE CLOSED REDUCTION Right 10/08/2015   Procedure: CLOSED MANIPULATION KNEE UNDER ANESTHESIA;  Surgeon: Cammy CopaScott Gregory Dean, MD;  [facility];  Service: Orthopedics;  Laterality: Right;  . MENISCUS REPAIR Right 09/03/2015   Procedure: REPAIR OF MENISCUS LATERAL;  Surgeon: Cammy CopaScott Gregory Dean, MD;  [facility];  Service: Orthopedics;  Laterality: Right;    There were no vitals filed for this visit.      Subjective Assessment - 11/01/15 0943    Subjective "some pain today, my stomach is bothering me today"    Currently in Pain? Yes   Pain Score 3    Pain Location Knee   Pain Orientation Right   Pain Descriptors / Indicators Sore   Pain Type Surgical pain   Pain Onset 1 to 4 weeks ago   Pain Frequency Intermittent   Aggravating Factors  N/A   Pain Relieving  Factors ice, meds            [facility] PT Assessment - 11/01/15 0001      AROM   Right Knee Extension -16   Right Knee Flexion 107  following bike                     [facility] Adult PT Treatment/Exercise - 11/01/15 0001      Knee/Hip Exercises: Standing   Other Standing Knee Exercises TKE into ball on wall     Knee/Hip Exercises: Supine   Short Arc Quad Sets Limitations performed during English as a second language teacherrussian     Electrical Stimulation   Electrical Stimulation Location R knee   Heritage managerlectrical Stimulation Action Russian   Electrical Stimulation Parameters L 53 10/10  x 10 min with 2 sec ramp   Electrical Stimulation Goals Neuromuscular facilitation;Strength     Vasopneumatic   Number Minutes Vasopneumatic  10 minutes   Vasopnuematic Location  Knee   Vasopneumatic Pressure Medium   Vasopneumatic Temperature  32  with 3# over knee for extension     Manual Therapy   Manual therapy comments retrograde/ antegrade edema reduction massage   Joint Mobilization P>A glide with gentle External rotation at end range grade 3                  PT Short Term Goals - 10/30/15 1313  PT SHORT TERM GOAL #1   Title pt will be I with inital HEP (10/09/2015)   Time 3   Period Weeks   Status Achieved     PT SHORT TERM GOAL #2   Title pt will be able to verbalize and demo techniques to reduce R knee pain and inflammation via RICE and HEP (10/09/2015)   Period Weeks   Status Achieved           PT Long Term Goals - 10/30/15 1313      PT LONG TERM GOAL #1   Title pt will be I with advance HEP as of last visit ( 10/30/2015)   Time 6   Period Weeks   Status On-going     PT LONG TERM GOAL #2   Title pt will improve R knee flexion to >/= 90 degrees with </= 3/10 pain to promote functional and efficient gait pattern (10/30/2015)   Time 6   Period Weeks   Status On-going     PT LONG TERM GOAL #3   Title pt will be able to walk/ stand >/= 15 min and navigate >/= 10 sets reciprocally  with =1 HHA on railing with no AD and </= 3/10 pain to promote functional mobility (10/30/2015)   Time 6   Period Weeks   Status On-going     PT LONG TERM GOAL #4   Title increase FOTO score to >/= 50% limited to demonstate improvement in function (10/30/2015)   Time 6   Period Weeks   Status Unable to assess     PT LONG TERM GOAL #5   Title increase R knee strenght to >/= 4-/5 with </=3/10 pain to promote safety with CKC activities (10/30/2015)   Time 6   Period Weeks   Status On-going               Plan - 11/01/15 1014    Clinical Impression Statement pt continues to improve with flexoin to 107 following bike and quad stretching, but still exhibits difficulty with extension with some improvement since last session to -16 at beginning of session that progressed to -11 at end of session. conitnued mobs for extension with gentle external rotation for facilitation end range extension.    PT Next Visit Plan continue [place], ROM, stretch calf, quad activation in standing, bike vs Nu-step vaso for swelling, continue KT tape PRN.    Consulted and Agree with Plan of Care Patient      Patient will benefit from skilled therapeutic intervention in order to improve the following deficits and impairments:  Pain, Improper body mechanics, Postural dysfunction, Increased edema, Decreased strength, Hypomobility, Decreased endurance, Decreased activity tolerance, Decreased balance, Increased fascial restricitons, Decreased range of motion, Abnormal gait  Visit Diagnosis: Pain in right knee  Localized edema  Stiffness of right knee, not elsewhere classified  Other abnormalities of gait and mobility     Problem List There are no active problems to display for this patient.  Colleen Wells PT, DPT, LAT, ATC  11/01/15  12:29 PM      [facility] [address] [place], [place], [address] Phone: [phone]   Fax:   [phone]  Name: Colleen Wells MRN: [id number] Date of Birth: [birth year]

## 2015-11-04 ENCOUNTER — Ambulatory Visit: Payer: Medicaid Other | Admitting: Physical Therapy

## 2015-11-04 DIAGNOSIS — M25561 Pain in right knee: Secondary | ICD-10-CM | POA: Diagnosis not present

## 2015-11-04 DIAGNOSIS — R2689 Other abnormalities of gait and mobility: Secondary | ICD-10-CM

## 2015-11-04 DIAGNOSIS — M25661 Stiffness of right knee, not elsewhere classified: Secondary | ICD-10-CM

## 2015-11-04 DIAGNOSIS — R600 Localized edema: Secondary | ICD-10-CM

## 2015-11-04 NOTE — Therapy (Signed)
[facility] [address] [place], [place], [address] Phone: [phone]   Fax:  [phone]  Physical Therapy Treatment  Patient Details  Name: Colleen Wells MRN: [id number] Date of Birth: 1986-02-01 Referring Provider: Cammy Copa MD  Encounter Date: 11/04/2015      PT End of Session - 11/04/15 1336    Visit Number 15   Number of Visits 25   Date for PT Re-Evaluation 12/25/15   Authorization Type Self-pay / Medicaid not authorized   PT Start Time 1149   PT Stop Time 1245   PT Time Calculation (min) 56 min   Activity Tolerance Patient tolerated treatment well   Behavior During Therapy [facility] for tasks assessed/performed      Past Medical History:  Diagnosis Date  . Arthrofibrosis of knee joint    right  . Medical history non-contributory   . Meniscus tear     Past Surgical History:  Procedure Laterality Date  . ANTERIOR CRUCIATE LIGAMENT REPAIR Right 09/03/2015   Procedure: REPAIR ANTERIOR CRUCIATE LIGAMENT  REPAIR OF AVULSION;  Surgeon: Cammy Copa, MD;  Location: [facility] OR;  Service: Orthopedics;  Laterality: Right;  . CESAREAN SECTION    . KNEE CLOSED REDUCTION Right 10/08/2015   Procedure: CLOSED MANIPULATION KNEE UNDER ANESTHESIA;  Surgeon: Cammy Copa, MD;  Location: [facility] OR;  Service: Orthopedics;  Laterality: Right;  . MENISCUS REPAIR Right 09/03/2015   Procedure: REPAIR OF MENISCUS LATERAL;  Surgeon: Cammy Copa, MD;  Location: [facility] OR;  Service: Orthopedics;  Laterality: Right;    There were no vitals filed for this visit.      Subjective Assessment - 11/04/15 1157    Subjective "having 1-2/10, feels good walking. I really worked on the massage to calm down the swelling over the weekend"    Currently in Pain? Yes   Pain Score 2    Pain Location Knee   Pain Orientation Right   Pain Descriptors / Indicators Sore   Pain Type Surgical pain   Pain Frequency Intermittent            [facility] PT Assessment - 11/04/15 1200      AROM   Right Knee Extension -9   Right Knee Flexion 110     PROM   Right Knee Extension -6   Right Knee Flexion 114                     [facility] Adult PT Treatment/Exercise - 11/04/15 0001      Knee/Hip Exercises: Stretches   Active Hamstring Stretch 2 reps;30 seconds  with foot on step using hands to push knee down   Quad Stretch Right;3 reps;30 seconds  with foot on 2 step rocking body forward for stretp   Quad Stretch Limitations verbal cues to avoid balistic stretching     Knee/Hip Exercises: Aerobic   Recumbent Bike L3 x 6 min  lowering seat ever 2 min for increased flexion     Knee/Hip Exercises: Standing   Gait Training navigating up/ down 5 steps x 4.  Walking heel to toe with equal stance time on each side working on avoiding limping 2 x 90 ft   Other Standing Knee Exercises TKE 2 x 10 weight shifting onto RLE with knee extension     Knee/Hip Exercises: Seated   Sit to Sand 2 sets;10 reps;Other (comment)  verbal/ visual and tactile cues to avoid weight shift to L      Vasopneumatic  Number Minutes Vasopneumatic  10 minutes   Vasopnuematic Location  Knee   Vasopneumatic Pressure Medium   Vasopneumatic Temperature  32  4# weight on knee during ice for extension     Manual Therapy   Joint Mobilization P>A glide with gentle External rotation at end range grade 3                PT Education - 11/04/15 1336    Education provided Yes   Education Details stretching hamstring and quad on steps to get more motion by using by or UE to assist with motion.    Person(s) Educated Patient   Methods Explanation;Verbal cues;Demonstration   Comprehension Verbalized understanding;Returned demonstration          PT Short Term Goals - 10/30/15 1313      PT SHORT TERM GOAL #1   Title pt will be I with inital HEP (10/09/2015)   Time 3   Period Weeks   Status Achieved     PT SHORT TERM GOAL #2   Title pt will  be able to verbalize and demo techniques to reduce R knee pain and inflammation via RICE and HEP (10/09/2015)   Period Weeks   Status Achieved           PT Long Term Goals - 10/30/15 1313      PT LONG TERM GOAL #1   Title pt will be I with advance HEP as of last visit ( 10/30/2015)   Time 6   Period Weeks   Status On-going     PT LONG TERM GOAL #2   Title pt will improve R knee flexion to >/= 90 degrees with </= 3/10 pain to promote functional and efficient gait pattern (10/30/2015)   Time 6   Period Weeks   Status On-going     PT LONG TERM GOAL #3   Title pt will be able to walk/ stand >/= 15 min and navigate >/= 10 sets reciprocally with =1 HHA on railing with no AD and </= 3/10 pain to promote functional mobility (10/30/2015)   Time 6   Period Weeks   Status On-going     PT LONG TERM GOAL #4   Title increase FOTO score to >/= 50% limited to demonstate improvement in function (10/30/2015)   Time 6   Period Weeks   Status Unable to assess     PT LONG TERM GOAL #5   Title increase R knee strenght to >/= 4-/5 with </=3/10 pain to promote safety with CKC activities (10/30/2015)   Time 6   Period Weeks   Status On-going               Plan - 11/04/15 1337    Clinical Impression Statement [name] continues to progress with therapy improving following manual to 110 degrees of flexion and -9 degrees of extension. pt continues to demo antalgic gait pattern, and weight shifting to the L with sit to stand exercises requiring verbal/ visual and tactile cues to work on correcting. pt is able to correct antalgic gait pattern without AD when focusing on it but requires verbal cues. pt conitnues to exhibit swelling in the knee that is improving with manual. continued Vaso to reduce swelling with elevation using weight over knee to continue working on extension.    PT Next Visit Plan continue [place]ian, AROM, CKC strengthening and quad activation, bike vs Nu-step vaso for swelling,  continue KT tape PRN. gait training (metronome?)   PT Home  Exercise Plan stretching quad/ hamsting using steps   Consulted and Agree with Plan of Care Patient;Family member/caregiver      Patient will benefit from skilled therapeutic intervention in order to improve the following deficits and impairments:  Pain, Improper body mechanics, Postural dysfunction, Increased edema, Decreased strength, Hypomobility, Decreased endurance, Decreased activity tolerance, Decreased balance, Increased fascial restricitons, Decreased range of motion, Abnormal gait  Visit Diagnosis: Pain in right knee  Localized edema  Other abnormalities of gait and mobility  Stiffness of right knee, not elsewhere classified     Problem List There are no active problems to display for this patient.  Lulu Riding PT, DPT, LAT, ATC  11/04/15  1:45 PM      [facility] [address] [place], [place], [address] Phone: [phone]   Fax:  [phone]  Name: Colleen Wells MRN: [id number] Date of Birth: Jul 11, 1985

## 2015-11-07 ENCOUNTER — Ambulatory Visit: Payer: Medicaid Other

## 2015-11-07 DIAGNOSIS — R600 Localized edema: Secondary | ICD-10-CM

## 2015-11-07 DIAGNOSIS — M25561 Pain in right knee: Secondary | ICD-10-CM

## 2015-11-07 DIAGNOSIS — M25661 Stiffness of right knee, not elsewhere classified: Secondary | ICD-10-CM

## 2015-11-07 DIAGNOSIS — R2689 Other abnormalities of gait and mobility: Secondary | ICD-10-CM

## 2015-11-07 NOTE — Patient Instructions (Signed)
Asked Ms Bethann GooJefferson to begin knee to chest with AP pressure to proximal tibia to facilitate glide and flexion ROM

## 2015-11-07 NOTE — Therapy (Signed)
[facility] [address] [place], [place], [address] Phone: [phone]   Fax:  [phone]  Physical Therapy Treatment  Patient Details  Name: Colleen Wells MRN: [id number] Date of Birth: Sep 29, 1985 Referring Provider: Cammy Copa MD  Encounter Date: 11/07/2015      PT End of Session - 11/07/15 1649    Visit Number 16   Number of Visits 28   Date for PT Re-Evaluation 12/25/15   Authorization Type Self-pay / Medicaid not authorized   PT Start Time 0440  late   PT Stop Time 0528   PT Time Calculation (min) 48 min   Activity Tolerance Patient tolerated treatment well;Patient limited by pain   Behavior During Therapy [facility] for tasks assessed/performed      Past Medical History:  Diagnosis Date  . Arthrofibrosis of knee joint    right  . Medical history non-contributory   . Meniscus tear     Past Surgical History:  Procedure Laterality Date  . ANTERIOR CRUCIATE LIGAMENT REPAIR Right 09/03/2015   Procedure: REPAIR ANTERIOR CRUCIATE LIGAMENT  REPAIR OF AVULSION;  Surgeon: Cammy Copa, MD;  Location: [facility] OR;  Service: Orthopedics;  Laterality: Right;  . CESAREAN SECTION    . KNEE CLOSED REDUCTION Right 10/08/2015   Procedure: CLOSED MANIPULATION KNEE UNDER ANESTHESIA;  Surgeon: Cammy Copa, MD;  Location: [facility] OR;  Service: Orthopedics;  Laterality: Right;  . MENISCUS REPAIR Right 09/03/2015   Procedure: REPAIR OF MENISCUS LATERAL;  Surgeon: Cammy Copa, MD;  Location: [facility] OR;  Service: Orthopedics;  Laterality: Right;    There were no vitals filed for this visit.      Subjective Assessment - 11/07/15 1645    Subjective 2/10 pain. MD said to decr to 3x/week for 4 weeks. MD stated she was doing good and to  keep doing what I'm doing, Stopped using crutch.     Currently in Pain? Yes   Pain Score 2    Pain Location Knee   Pain Orientation Right   Pain Descriptors / Indicators Sore   Pain Onset  More than a month ago   Pain Frequency Intermittent   Aggravating Factors  stretching   Pain Relieving Factors ice meds   Multiple Pain Sites No            [facility] PT Assessment - 11/07/15 0001      AROM   Right Knee Flexion 115     PROM   Right Knee Flexion 120                     [facility] Adult PT Treatment/Exercise - 11/07/15 0001      Knee/Hip Exercises: Aerobic   Recumbent Bike L2 8 min then lowered for 60 sec then lowered for 60 sec     Vasopneumatic   Number Minutes Vasopneumatic  10 minutes   Vasopnuematic Location  Knee   Vasopneumatic Pressure Medium   Vasopneumatic Temperature  32  3 pound weight     Manual Therapy   Joint Mobilization by PT and By pt. multiple reps and adked pt to do at home    Passive ROM flexion ROM multiple reps and hold  times from tibial plateau  to 8-10 inches from joint linne.                 PT Education - 11/07/15 1736    Education provided Yes   Education Details Benefits of self stretch into flexion  Person(s) Educated Patient   Methods Explanation;Demonstration;Tactile cues;Verbal cues   Comprehension Returned demonstration;Verbalized understanding          PT Short Term Goals - 10/30/15 1313      PT SHORT TERM GOAL #1   Title pt will be I with inital HEP (10/09/2015)   Time 3   Period Weeks   Status Achieved     PT SHORT TERM GOAL #2   Title pt will be able to verbalize and demo techniques to reduce R knee pain and inflammation via RICE and HEP (10/09/2015)   Period Weeks   Status Achieved           PT Long Term Goals - 10/30/15 1313      PT LONG TERM GOAL #1   Title pt will be I with advance HEP as of last visit ( 10/30/2015)   Time 6   Period Weeks   Status On-going     PT LONG TERM GOAL #2   Title pt will improve R knee flexion to >/= 90 degrees with </= 3/10 pain to promote functional and efficient gait pattern (10/30/2015)   Time 6   Period Weeks   Status On-going     PT LONG  TERM GOAL #3   Title pt will be able to walk/ stand >/= 15 min and navigate >/= 10 sets reciprocally with =1 HHA on railing with no AD and </= 3/10 pain to promote functional mobility (10/30/2015)   Time 6   Period Weeks   Status On-going     PT LONG TERM GOAL #4   Title increase FOTO score to >/= 50% limited to demonstate improvement in function (10/30/2015)   Time 6   Period Weeks   Status Unable to assess     PT LONG TERM GOAL #5   Title increase R knee strenght to >/= 4-/5 with </=3/10 pain to promote safety with CKC activities (10/30/2015)   Time 6   Period Weeks   Status On-going               Plan - 11/07/15 1650    Clinical Impression Statement Flexion ROM looks improved. Pain still limits tolerance. She should do more self stretch into flexion and she should improve flexion ROM with less PT assist. Also walking without device now.    PT Frequency 3x / week   PT Duration 4 weeks   PT Treatment/Interventions ADLs/Self Care Home Management;Cryotherapy;Electrical Stimulation;Iontophoresis 4mg /ml Dexamethasone;Moist Heat;Ultrasound;Taping;Patient/family education;Gait training;Stair training;Manual techniques;Passive range of motion;Therapeutic activities;Therapeutic exercise;Balance training;Neuromuscular re-education   PT Next Visit Plan Continue ROM and strengthening   Consulted and Agree with Plan of Care Patient   Family Member Consulted significant other      Patient will benefit from skilled therapeutic intervention in order to improve the following deficits and impairments:  Pain, Improper body mechanics, Postural dysfunction, Increased edema, Decreased strength, Hypomobility, Decreased endurance, Decreased activity tolerance, Decreased balance, Increased fascial restricitons, Decreased range of motion, Abnormal gait  Visit Diagnosis: Pain in right knee  Stiffness of right knee, not elsewhere classified  Localized edema  Other abnormalities of gait and  mobility     Problem List There are no active problems to display for this patient.   Caprice RedChasse, Baltazar Pekala M  PT 11/07/2015, 5:38 PM  [facility] [address] [place], [place], [address] Phone: [phone]   Fax:  [phone]  Name: Temple PaciniShguna Enerson MRN: [id number] Date of Birth: Mar 05, 1985

## 2015-11-11 ENCOUNTER — Ambulatory Visit: Payer: Medicaid Other | Admitting: Physical Therapy

## 2015-11-11 DIAGNOSIS — R600 Localized edema: Secondary | ICD-10-CM

## 2015-11-11 DIAGNOSIS — R2689 Other abnormalities of gait and mobility: Secondary | ICD-10-CM

## 2015-11-11 DIAGNOSIS — M25561 Pain in right knee: Secondary | ICD-10-CM | POA: Diagnosis not present

## 2015-11-11 DIAGNOSIS — M25661 Stiffness of right knee, not elsewhere classified: Secondary | ICD-10-CM

## 2015-11-11 NOTE — Therapy (Signed)
[place], [place], [address] Phone: [phone]   Fax:  [phone]  Physical Therapy Treatment  Patient Details  Name: Colleen Wells MRN: [id number] Date of Birth: [birth year] Referring Provider: Meredith Pel MD  Encounter Date: 11/11/2015      PT End of Session - 11/11/15 1248    Visit Number 17   Number of Visits 28   Date for PT Re-Evaluation 12/25/15   Authorization Type Self-pay / Medicaid not authorized   PT Start Time 1149   PT Stop Time 1235   PT Time Calculation (min) 46 min      Past Medical History:  Diagnosis Date  . Arthrofibrosis of knee joint    right  . Medical history non-contributory   . Meniscus tear     Past Surgical History:  Procedure Laterality Date  . ANTERIOR CRUCIATE LIGAMENT REPAIR Right 09/03/2015   Procedure: REPAIR ANTERIOR CRUCIATE LIGAMENT  REPAIR OF AVULSION;  Surgeon: Meredith Pel, MD;  Location: [place];  Service: Orthopedics;  Laterality: Right;  . CESAREAN SECTION    . KNEE CLOSED REDUCTION Right 10/08/2015   Procedure: CLOSED MANIPULATION KNEE UNDER ANESTHESIA;  Surgeon: Meredith Pel, MD;  Location: [place];  Service: Orthopedics;  Laterality: Right;  . MENISCUS REPAIR Right 09/03/2015   Procedure: REPAIR OF MENISCUS LATERAL;  Surgeon: Meredith Pel, MD;  Location: [place];  Service: Orthopedics;  Laterality: Right;    There were no vitals filed for this visit.      Subjective Assessment - 11/11/15 1345    Currently in Pain? Other (Comment)  doing okay, did not rate            [facility] PT Assessment - 11/11/15 0001      AROM   Right Knee Extension -10   Right Knee Flexion 115                     [facility] Adult PT Treatment/Exercise - 11/11/15 0001      Knee/Hip Exercises: Stretches   Active Hamstring Stretch 2 reps;30 seconds  with foot on step using hands to push knee down   Active Hamstring Stretch Limitations  long sitting   Passive Hamstring Stretch 2 reps;30 seconds   Passive Hamstring Stretch Limitations with contract relax   Other Knee/Hip Stretches slant board stretch right, and edge of step stretch right 30 sec x 2 each     Knee/Hip Exercises: Aerobic   Recumbent Bike L2 x 5 minutes     Knee/Hip Exercises: Machines for Strengthening   Cybex Leg Press 30# single and bilateral      Knee/Hip Exercises: Standing   Heel Raises 10 reps   Heel Raises Limitations the up with both, down with right    Knee Flexion 2 sets;10 reps   Forward Step Up 2 sets;10 reps;Hand Hold: 1;Step Height: 6"   Wall Squat 10 reps   Wall Squat Limitations then wide x 10   SLS 9 sec best   Other Standing Knee Exercises retro walking and forward/backward weight shifting/ roll through, resisted gait 2 plates retro x 4 passes  with pertubations    Other Standing Knee Exercises TKE 2 x 10 weight shifting onto RLE with knee extension     Knee/Hip Exercises: Prone   Prone Knee Hang 1 minute  5#   Prone Knee Hang Limitations then no weight 2 minutes- add pillow under knee for cofort  Manual Therapy   Passive ROM knee extension overpressure 3 bouts for 30 seconds                  PT Short Term Goals - 10/30/15 1313      PT SHORT TERM GOAL #1   Title pt will be I with inital HEP (10/09/2015)   Time 3   Period Weeks   Status Achieved     PT SHORT TERM GOAL #2   Title pt will be able to verbalize and demo techniques to reduce R knee pain and inflammation via RICE and HEP (10/09/2015)   Period Weeks   Status Achieved           PT Long Term Goals - 11/11/15 1153      PT LONG TERM GOAL #1   Title pt will be I with advance HEP as of last visit ( 10/30/2015)   Time 6   Period Weeks   Status On-going     PT LONG TERM GOAL #2   Title pt will improve R knee flexion to >/= 90 degrees with </= 3/10 pain to promote functional and efficient gait pattern (10/30/2015)   Time 6   Period Weeks   Status  On-going     PT LONG TERM GOAL #3   Title pt will be able to walk/ stand >/= 15 min and navigate >/= 10 sets reciprocally with =1 HHA on railing with no AD and </= 3/10 pain to promote functional mobility (10/30/2015)   Baseline walks 15 minutes, unable to climb stairs reciprocally, unable to descend reciprocally   Status Partially Met     PT LONG TERM GOAL #4   Title increase FOTO score to >/= 50% limited to demonstate improvement in function (10/30/2015)   Baseline 56% improved from 60% limited   Time 6   Period Weeks   Status On-going     PT LONG TERM GOAL #5   Title increase R knee strenght to >/= 4-/5 with </=3/10 pain to promote safety with CKC activities (10/30/2015)   Time 6   Period Weeks   Status Unable to assess               Plan - 11/11/15 1346    Clinical Impression Statement Pt reports she stopped prone knee hang because she felt like it stopped working. Reminded her of various hamstring stretches and to perfrom several times throughout the day. Also instructed her in calf stretch on edge of step. She continues to lack 10 degrees of knee extension. She can ambulate 15 minutes without increased pain. LTG# 3 partially Met.    PT Next Visit Plan Continue ROM and strengthening, knee extension   PT Home Exercise Plan stretching quad/ hamsting using steps      Patient will benefit from skilled therapeutic intervention in order to improve the following deficits and impairments:  Pain, Improper body mechanics, Postural dysfunction, Increased edema, Decreased strength, Hypomobility, Decreased endurance, Decreased activity tolerance, Decreased balance, Increased fascial restricitons, Decreased range of motion, Abnormal gait  Visit Diagnosis: Pain in right knee  Stiffness of right knee, not elsewhere classified  Localized edema  Other abnormalities of gait and mobility     Problem List There are no active problems to display for this patient.   [name], [place] 11/11/2015, [place], [place], [address] Phone: [phone]   Fax:  [phone]  Name: Makyra Corprew MRN:  [id number] Date of Birth: [birth year]

## 2015-11-12 ENCOUNTER — Ambulatory Visit: Payer: Medicaid Other | Admitting: Physical Therapy

## 2015-11-12 DIAGNOSIS — M25561 Pain in right knee: Secondary | ICD-10-CM | POA: Diagnosis not present

## 2015-11-12 DIAGNOSIS — R2689 Other abnormalities of gait and mobility: Secondary | ICD-10-CM

## 2015-11-12 DIAGNOSIS — M25661 Stiffness of right knee, not elsewhere classified: Secondary | ICD-10-CM

## 2015-11-12 DIAGNOSIS — R600 Localized edema: Secondary | ICD-10-CM

## 2015-11-12 NOTE — Therapy (Signed)
[place], [place], [address] Phone: [phone]   Fax:  [phone]  Physical Therapy Treatment  Patient Details  Name: Colleen Wells MRN: [id number] Date of Birth: [birth year] Referring Provider: Meredith Pel MD  Encounter Date: 11/12/2015      PT End of Session - 11/12/15 1253    Visit Number 18   Number of Visits 28   Date for PT Re-Evaluation 12/25/15   Authorization Type Self-pay / Medicaid not authorized   PT Start Time 1146   PT Stop Time 1236   PT Time Calculation (min) 50 min   Activity Tolerance Patient tolerated treatment well   Behavior During Therapy [facility] for tasks assessed/performed      Past Medical History:  Diagnosis Date  . Arthrofibrosis of knee joint    right  . Medical history non-contributory   . Meniscus tear     Past Surgical History:  Procedure Laterality Date  . ANTERIOR CRUCIATE LIGAMENT REPAIR Right 09/03/2015   Procedure: REPAIR ANTERIOR CRUCIATE LIGAMENT  REPAIR OF AVULSION;  Surgeon: Meredith Pel, MD;  Location: [place];  Service: Orthopedics;  Laterality: Right;  . CESAREAN SECTION    . KNEE CLOSED REDUCTION Right 10/08/2015   Procedure: CLOSED MANIPULATION KNEE UNDER ANESTHESIA;  Surgeon: Meredith Pel, MD;  Location: [place];  Service: Orthopedics;  Laterality: Right;  . MENISCUS REPAIR Right 09/03/2015   Procedure: REPAIR OF MENISCUS LATERAL;  Surgeon: Meredith Pel, MD;  Location: [place];  Service: Orthopedics;  Laterality: Right;    There were no vitals filed for this visit.      Subjective Assessment - 11/12/15 1151    Subjective " not much pain, I was tired after last session"    Currently in Pain? Yes   Pain Score 1    Pain Location Knee   Pain Orientation Right   Pain Type Surgical pain   Pain Frequency Intermittent            [facility] PT Assessment - 11/12/15 0001      AROM   Right Knee Extension -7                      [facility] Adult PT Treatment/Exercise - 11/12/15 1154      Knee/Hip Exercises: Stretches   Active Hamstring Stretch 2 reps;30 seconds;Right   Other Knee/Hip Stretches slant board stretch right, and edge of step stretch right 30 sec x 2 each     Knee/Hip Exercises: Aerobic   Elliptical L3 x 5 min, 0 elevation     Knee/Hip Exercises: Machines for Strengthening   Cybex Leg Press 1 x 10 25# SL, 1 x 10 30# SL, 1 x 10 press with both control back with RLE 45#     Knee/Hip Exercises: Standing   Heel Raises Both;2 sets;15 reps  off edge of step   Forward Step Up Right;2 sets;Step Height: 4";10 reps  with TKE 1 set with red theraband, 1 with green theraband     Knee/Hip Exercises: Seated   Long Arc Quad 3 sets;10 reps  with ball sqeeze, 1 set no weight, 2 set 3#   Sit to Sand 2 sets;10 reps;Other (comment)  with ball squeeze     Modalities   Modalities Moist Heat     Moist Heat Therapy   Number Minutes Moist Heat 10 Minutes   Moist Heat Location Knee  with elevation, trial of heat  Manual Therapy   Joint Mobilization knee extension grade 3 with tibial external rotation    Passive ROM knee extension overpressure 3 bouts for 30 seconds                  PT Short Term Goals - 10/30/15 1313      PT SHORT TERM GOAL #1   Title pt will be I with inital HEP (10/09/2015)   Time 3   Period Weeks   Status Achieved     PT SHORT TERM GOAL #2   Title pt will be able to verbalize and demo techniques to reduce R knee pain and inflammation via RICE and HEP (10/09/2015)   Period Weeks   Status Achieved           PT Long Term Goals - 11/11/15 1153      PT LONG TERM GOAL #1   Title pt will be I with advance HEP as of last visit ( 10/30/2015)   Time 6   Period Weeks   Status On-going     PT LONG TERM GOAL #2   Title pt will improve R knee flexion to >/= 90 degrees with </= 3/10 pain to promote functional and efficient gait pattern (10/30/2015)    Time 6   Period Weeks   Status On-going     PT LONG TERM GOAL #3   Title pt will be able to walk/ stand >/= 15 min and navigate >/= 10 sets reciprocally with =1 HHA on railing with no AD and </= 3/10 pain to promote functional mobility (10/30/2015)   Baseline walks 15 minutes, unable to climb stairs reciprocally, unable to descend reciprocally   Status Partially Met     PT LONG TERM GOAL #4   Title increase FOTO score to >/= 50% limited to demonstate improvement in function (10/30/2015)   Baseline 56% improved from 60% limited   Time 6   Period Weeks   Status On-going     PT LONG TERM GOAL #5   Title increase R knee strenght to >/= 4-/5 with </=3/10 pain to promote safety with CKC activities (10/30/2015)   Time 6   Period Weeks   Status Unable to assess               Plan - 11/12/15 1253    Clinical Impression Statement pt reports minimal soreness today at 1/10. mobs to increase extension with gentle tibial rotation to assist with improved motion. improved extension to -7 to todays session. trialed MHP following todays session to assess benefit from swelling in the knee.    PT Next Visit Plan Continue ROM and strengthening, knee extension, balance   Consulted and Agree with Plan of Care Patient      Patient will benefit from skilled therapeutic intervention in order to improve the following deficits and impairments:  Pain, Improper body mechanics, Postural dysfunction, Increased edema, Decreased strength, Hypomobility, Decreased endurance, Decreased activity tolerance, Decreased balance, Increased fascial restricitons, Decreased range of motion, Abnormal gait  Visit Diagnosis: Pain in right knee  Stiffness of right knee, not elsewhere classified  Localized edema  Other abnormalities of gait and mobility     Problem List There are no active problems to display for this patient.  Lulu Riding PT, DPT, LAT, ATC  11/12/15  12:58 PM      [facility] [address] [place], [place], [address] Phone: [phone]   Fax:  [phone]  Name: Colleen Wells  MRN: [id number] Date of Birth: 1986/02/08

## 2015-11-13 ENCOUNTER — Ambulatory Visit: Payer: Medicaid Other | Admitting: Physical Therapy

## 2015-11-13 DIAGNOSIS — M25561 Pain in right knee: Secondary | ICD-10-CM | POA: Diagnosis not present

## 2015-11-13 DIAGNOSIS — R600 Localized edema: Secondary | ICD-10-CM

## 2015-11-13 DIAGNOSIS — R2689 Other abnormalities of gait and mobility: Secondary | ICD-10-CM

## 2015-11-13 DIAGNOSIS — M25661 Stiffness of right knee, not elsewhere classified: Secondary | ICD-10-CM

## 2015-11-13 NOTE — Therapy (Signed)
[place], [place], [address] Phone: [phone]   Fax:  [phone]  Physical Therapy Treatment  Patient Details  Name: Colleen Wells MRN: [id number] Date of Birth: 1985/08/26 Referring Provider: Meredith Pel MD  Encounter Date: 11/13/2015      PT End of Session - 11/13/15 1151    Visit Number 19   Number of Visits 28   Date for PT Re-Evaluation 12/25/15   PT Start Time 1106   PT Stop Time 1158   PT Time Calculation (min) 52 min   Activity Tolerance Patient tolerated treatment well   Behavior During Therapy [facility] for tasks assessed/performed      Past Medical History:  Diagnosis Date  . Arthrofibrosis of knee joint    right  . Medical history non-contributory   . Meniscus tear     Past Surgical History:  Procedure Laterality Date  . ANTERIOR CRUCIATE LIGAMENT REPAIR Right 09/03/2015   Procedure: REPAIR ANTERIOR CRUCIATE LIGAMENT  REPAIR OF AVULSION;  Surgeon: Meredith Pel, MD;  Location: [place];  Service: Orthopedics;  Laterality: Right;  . CESAREAN SECTION    . KNEE CLOSED REDUCTION Right 10/08/2015   Procedure: CLOSED MANIPULATION KNEE UNDER ANESTHESIA;  Surgeon: Meredith Pel, MD;  Location: [place];  Service: Orthopedics;  Laterality: Right;  . MENISCUS REPAIR Right 09/03/2015   Procedure: REPAIR OF MENISCUS LATERAL;  Surgeon: Meredith Pel, MD;  Location: [place];  Service: Orthopedics;  Laterality: Right;    There were no vitals filed for this visit.      Subjective Assessment - 11/13/15 1116    Subjective "I am feeling pretty sore from yesterdays exercise, just feels like I worked the muscles pretty hard"    Currently in Pain? Yes   Pain Score 1    Pain Location Knee   Pain Orientation Right   Pain Descriptors / Indicators Sore   Pain Type Surgical pain   Pain Onset More than a month ago   Pain Frequency Intermittent                          [facility] Adult PT Treatment/Exercise - 11/13/15 1120      Knee/Hip Exercises: Stretches   Active Hamstring Stretch 30 seconds;3 reps  with strap   Other Knee/Hip Stretches slant board stretch right, and edge of step stretch right 30 sec x 2 each     Knee/Hip Exercises: Aerobic   Elliptical L 1 x 5 min     Knee/Hip Exercises: Standing   Gait Training 4 x 30 ft utilizing heel strike and toe of focusing on equal stance time bil  reducing limping     Knee/Hip Exercises: Seated   Long Arc Quad AROM;Strengthening;Both;2 sets;10 reps     Knee/Hip Exercises: Supine   Straight Leg Raises AROM;Strengthening;Right;2 sets;10 reps  performing sustained quad set, reseting each rep   Straight Leg Raises Limitations 20 degree quad lag with SLR     Knee/Hip Exercises: Sidelying   Hip ABduction AROM;Right;2 sets;10 reps   Hip ABduction Limitations tactile cues for form     Moist Heat Therapy   Number Minutes Moist Heat 10 Minutes   Moist Heat Location Knee  R knee, with 3# weight for assisted extension     Manual Therapy   Joint Mobilization knee extension grade 3 with tibial external rotation  using belt  Balance Exercises - 11/13/15 1145      Balance Exercises: Standing   SLS Eyes open;Eyes closed;3 reps;Solid surface;30 secs  able to hold with eyes closed fo 4 sec             PT Short Term Goals - 10/30/15 1313      PT SHORT TERM GOAL #1   Title pt will be I with inital HEP (10/09/2015)   Time 3   Period Weeks   Status Achieved     PT SHORT TERM GOAL #2   Title pt will be able to verbalize and demo techniques to reduce R knee pain and inflammation via RICE and HEP (10/09/2015)   Period Weeks   Status Achieved           PT Long Term Goals - 11/11/15 1153      PT LONG TERM GOAL #1   Title pt will be I with advance HEP as of last visit ( 10/30/2015)   Time 6   Period Weeks   Status On-going     PT LONG TERM GOAL #2   Title pt will improve R knee  flexion to >/= 90 degrees with </= 3/10 pain to promote functional and efficient gait pattern (10/30/2015)   Time 6   Period Weeks   Status On-going     PT LONG TERM GOAL #3   Title pt will be able to walk/ stand >/= 15 min and navigate >/= 10 sets reciprocally with =1 HHA on railing with no AD and </= 3/10 pain to promote functional mobility (10/30/2015)   Baseline walks 15 minutes, unable to climb stairs reciprocally, unable to descend reciprocally   Status Partially Met     PT LONG TERM GOAL #4   Title increase FOTO score to >/= 50% limited to demonstate improvement in function (10/30/2015)   Baseline 56% improved from 60% limited   Time 6   Period Weeks   Status On-going     PT LONG TERM GOAL #5   Title increase R knee strenght to >/= 4-/5 with </=3/10 pain to promote safety with CKC activities (10/30/2015)   Time 6   Period Weeks   Status Unable to assess               Plan - 11/13/15 1158    Clinical Impression Statement pt states she is feeling sore from yesterday's treament. She continues to progress with decreased pain and swelling reporting the heat helped. pt demonstrated difficulty with single leg balance with eyes open and significant difficulty with eyes closed. Continued strengtheing for quads and hip abductors. continued heat post sesison.    PT Next Visit Plan Continue ROM and strengthening, knee extension, balance training,    Consulted and Agree with Plan of Care Patient      Patient will benefit from skilled therapeutic intervention in order to improve the following deficits and impairments:  Pain, Improper body mechanics, Postural dysfunction, Increased edema, Decreased strength, Hypomobility, Decreased endurance, Decreased activity tolerance, Decreased balance, Increased fascial restricitons, Decreased range of motion, Abnormal gait  Visit Diagnosis: Pain in right knee  Stiffness of right knee, not elsewhere classified  Localized edema  Other  abnormalities of gait and mobility     Problem List There are no active problems to display for this patient.  Starr Lake PT, DPT, LAT, ATC  11/13/15  12:04 PM      [facility] [address] [place], [place], [address]  Phone: [phone]   Fax:  [phone]  Name: Colleen Wells MRN: [id number] Date of Birth: 1986-02-15

## 2015-11-17 ENCOUNTER — Emergency Department (HOSPITAL_COMMUNITY): Payer: Medicaid Other

## 2015-11-17 ENCOUNTER — Emergency Department (HOSPITAL_COMMUNITY)
Admission: EM | Admit: 2015-11-17 | Discharge: 2015-11-17 | Disposition: A | Discharge status: Home / Self Care | Payer: Medicaid Other | Attending: Emergency Medicine | Admitting: Emergency Medicine

## 2015-11-17 ENCOUNTER — Encounter (HOSPITAL_COMMUNITY): Payer: Self-pay | Admitting: *Deleted

## 2015-11-17 DIAGNOSIS — Y929 Unspecified place or not applicable: Secondary | ICD-10-CM | POA: Insufficient documentation

## 2015-11-17 DIAGNOSIS — Y939 Activity, unspecified: Secondary | ICD-10-CM | POA: Diagnosis not present

## 2015-11-17 DIAGNOSIS — W19XXXA Unspecified fall, initial encounter: Secondary | ICD-10-CM | POA: Diagnosis not present

## 2015-11-17 DIAGNOSIS — M25561 Pain in right knee: Secondary | ICD-10-CM | POA: Diagnosis present

## 2015-11-17 DIAGNOSIS — Y999 Unspecified external cause status: Secondary | ICD-10-CM | POA: Insufficient documentation

## 2015-11-17 DIAGNOSIS — M25461 Effusion, right knee: Secondary | ICD-10-CM | POA: Diagnosis not present

## 2015-11-17 MED ORDER — OXYCODONE HCL 5 MG PO TABS: 5.0000 mg | ORAL_TABLET | Freq: Four times a day (QID) | ORAL | 0 refills | Status: DC | PRN

## 2015-11-17 NOTE — ED Notes (Signed)
Ortho tech paged x 2  

## 2015-11-17 NOTE — Discharge Instructions (Signed)
Call you orhto in the AM your xray show swelling and a small amount of fluid in the joint  You have been fitted with a knee immobilizer and given a prescription for pain control

## 2015-11-17 NOTE — ED Provider Notes (Signed)
[facility]-EM[facility]ENCY DEPT Provider Note   CSN: [id number] Arrival date & time: 11/17/15  1900     History   Chief Complaint Chief Complaint  Patient presents with  . Fall  . Leg Injury    HPI Colleen Wells is a 30 y.o. female.  This a 30 year old female who presents with right knee pain after fall today.  She has a history of anterior cruciate ligament tear repair in July with manipulation in August.  She states that she has been going to rehabilitation and is gradually getting better.  Today she required Vicodin and ambulation assistance with use of one crutch.  She has been unable to put full pressure on her right leg      Past Medical History:  Diagnosis Date  . Arthrofibrosis of knee joint    right  . Medical history non-contributory   . Meniscus tear     There are no active problems to display for this patient.   Past Surgical History:  Procedure Laterality Date  . ANTERIOR CRUCIATE LIGAMENT REPAIR Right 09/03/2015   Procedure: REPAIR ANTERIOR CRUCIATE LIGAMENT  REPAIR OF AVULSION;  Surgeon: Cammy CopaScott Gregory Dean, MD;  Location: [facility];  Service: Orthopedics;  Laterality: Right;  . CESAREAN SECTION    . KNEE CLOSED REDUCTION Right 10/08/2015   Procedure: CLOSED MANIPULATION KNEE UNDER ANESTHESIA;  Surgeon: Cammy CopaScott Gregory Dean, MD;  [facility];  Service: Orthopedics;  Laterality: Right;  . MENISCUS REPAIR Right 09/03/2015   Procedure: REPAIR OF MENISCUS LATERAL;  Surgeon: Cammy CopaScott Gregory Dean, MD;  [facility];  Service: Orthopedics;  Laterality: Right;    OB History    No data available       Home Medications    Prior to Admission medications   Medication Sig Start Date End Date Taking? Authorizing Provider  naproxen sodium (ANAPROX) 220 MG tablet Take 220 mg by mouth 2 (two) times daily with a meal.    Historical Provider, MD  oxyCODONE (OXY IR/ROXICODONE) 5 MG immediate release tablet Take 1 tablet (5 mg total) by mouth every 6 (six) hours as needed for  moderate pain. 11/17/15   Earley FavorGail Curtis Uriarte, NP    Family History No family history on file.  Social History Social History  Substance Use Topics  . Smoking status: Never Smoker  . Smokeless tobacco: Never Used  . Alcohol use No     Allergies   No known allergies   Review of Systems Review of Systems  Constitutional: Negative for fever.  Musculoskeletal: Positive for joint swelling.  All other systems reviewed and are negative.    Physical Exam Updated Vital Signs BP 139/82 (BP Location: Right Arm)   Pulse 89   Temp 98.3 F (36.8 C) (Oral)   Resp 16   Ht 5\' 8"  (1.727 m)   Wt 79.1 kg   SpO2 97%   BMI 26.50 kg/m   Physical Exam  Constitutional: She appears well-developed and well-nourished.  HENT:  Head: Normocephalic.  Eyes: Pupils are equal, round, and reactive to light.  Neck: Normal range of motion.  Cardiovascular: Normal rate.   Pulmonary/Chest: Effort normal.  Musculoskeletal: She exhibits edema and tenderness. She exhibits no deformity.  Neurological: She is alert.  Skin: Skin is warm.  Nursing note and vitals reviewed.    ED Treatments / Results  Labs (all labs ordered are listed, but only abnormal results are displayed) Labs Reviewed - No data to display  EKG  EKG Interpretation None  Radiology Dg Knee 2 Views Right  Result Date: 11/17/2015 CLINICAL DATA:  30 year old female with ACL tear in June/July. Fall with severe pain. Initial encounter. EXAM: RIGHT KNEE - 1-2 VIEW COMPARISON:  Right knee MRI 08/24/2015. Right knee radiographs 08/13/2015. FINDINGS: Small to moderate joint effusion. Increased stranding in Hoffa's fat pad. There is also generalized soft tissue swelling about the knee with subcutaneous stranding. The patella is intact. There are small dystrophic calcifications along the course of the medial collateral ligament which are new since June. Subtle posttraumatic calcifications also in the joint space. No acute fracture or  dislocation identified. IMPRESSION: 1. Increased generalized superficial soft tissue swelling about the right knee and small to moderate joint effusion. 2. Dystrophic appearing calcifications along the medial joint which are new since June but do not appear to reflect fracture fragments. Favor these are due to abnormality of the medial collateral ligament. 3. No acute fracture or dislocation identified. Electronically Signed   By: Odessa Fleming M.D.   On: 11/17/2015 21:37    Procedures Procedures (including critical care time)  Medications Ordered in ED Medications - No data to display   Initial Impression / Assessment and Plan / ED Course  I have reviewed the triage vital signs and the nursing notes.  Pertinent labs & imaging results that were available during my care of the patient were reviewed by me and considered in my medical decision making (see chart for details).  Clinical Course       Final Clinical Impressions(s) / ED Diagnoses   Final diagnoses:  Knee effusion, right  Fall, initial encounter    New Prescriptions Current Discharge Medication List       Earley Favor, NP 11/17/15 2155    Lorre Nick, MD 11/19/15 [phone]

## 2015-11-17 NOTE — ED Notes (Signed)
Ortho tech called, confirmed immobilizer, will come to pt bedside.

## 2015-11-17 NOTE — ED Triage Notes (Signed)
Patient tore ACL in right knee in June and has been having physical therapy.  Fell last night injury the right knee.  Knee swollen

## 2015-11-17 NOTE — ED Notes (Signed)
Pt states she does not want to wait on knee immobilizer. Also states she has one at home already.

## 2015-11-18 ENCOUNTER — Ambulatory Visit: Payer: Medicaid Other | Admitting: Physical Therapy

## 2015-11-18 DIAGNOSIS — M25561 Pain in right knee: Secondary | ICD-10-CM | POA: Diagnosis not present

## 2015-11-18 DIAGNOSIS — R2689 Other abnormalities of gait and mobility: Secondary | ICD-10-CM

## 2015-11-18 DIAGNOSIS — M25661 Stiffness of right knee, not elsewhere classified: Secondary | ICD-10-CM

## 2015-11-18 DIAGNOSIS — R600 Localized edema: Secondary | ICD-10-CM
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
## 2015-11-20 ENCOUNTER — Ambulatory Visit: Payer: Medicaid Other | Admitting: Physical Therapy

## 2015-11-20 DIAGNOSIS — M25661 Stiffness of right knee, not elsewhere classified: Secondary | ICD-10-CM

## 2015-11-20 DIAGNOSIS — M25561 Pain in right knee: Secondary | ICD-10-CM

## 2015-11-20 DIAGNOSIS — R2689 Other abnormalities of gait and mobility: Secondary | ICD-10-CM

## 2015-11-20 DIAGNOSIS — R600 Localized edema: Secondary | ICD-10-CM

## 2015-11-20 NOTE — Therapy (Signed)
[place], [place], [address] Phone: [phone]   Fax:  [phone]  Physical Therapy Treatment  Patient Details  Name: Colleen Wells MRN: [id number] Date of Birth: 15-Feb-1986 Referring Provider: Meredith Pel MD  Encounter Date: 11/20/2015      PT End of Session - 11/20/15 1613    Visit Number 21   Number of Visits 28   Date for PT Re-Evaluation 12/25/15   Authorization Type Self-pay / Medicaid not authorized   PT Start Time 1426  pt arrived 11 min late   PT Stop Time 1506   PT Time Calculation (min) 40 min   Activity Tolerance Patient tolerated treatment well   Behavior During Therapy [facility] for tasks assessed/performed      Past Medical History:  Diagnosis Date  . Arthrofibrosis of knee joint    right  . Medical history non-contributory   . Meniscus tear     Past Surgical History:  Procedure Laterality Date  . ANTERIOR CRUCIATE LIGAMENT REPAIR Right 09/03/2015   Procedure: REPAIR ANTERIOR CRUCIATE LIGAMENT  REPAIR OF AVULSION;  Surgeon: Meredith Pel, MD;  Location: [place];  Service: Orthopedics;  Laterality: Right;  . CESAREAN SECTION    . KNEE CLOSED REDUCTION Right 10/08/2015   Procedure: CLOSED MANIPULATION KNEE UNDER ANESTHESIA;  Surgeon: Meredith Pel, MD;  Location: [place];  Service: Orthopedics;  Laterality: Right;  . MENISCUS REPAIR Right 09/03/2015   Procedure: REPAIR OF MENISCUS LATERAL;  Surgeon: Meredith Pel, MD;  Location: [place];  Service: Orthopedics;  Laterality: Right;    There were no vitals filed for this visit.      Subjective Assessment - 11/20/15 1428    Subjective " no pain sitting, pain only noted with walking"   Currently in Pain? Yes   Pain Score 6   0/10 pain with sitting   Pain Orientation Right   Pain Type Acute pain   Pain Onset More than a month ago   Pain Frequency Intermittent   Aggravating Factors  falling, walking, weight bearing                          [facility] Adult PT Treatment/Exercise - 11/20/15 0001      Knee/Hip Exercises: Stretches   Active Hamstring Stretch 30 seconds;3 reps     Knee/Hip Exercises: Aerobic   Recumbent Bike L2 x 6 min     Knee/Hip Exercises: Supine   Quad Sets 2 sets;10 reps   Straight Leg Raises AROM;Strengthening;Right;2 sets;10 reps   Straight Leg Raises Limitations -18 quad lag     Moist Heat Therapy   Number Minutes Moist Heat 10 Minutes   Moist Heat Location Knee  with 3# for extension     Manual Therapy   Manual therapy comments retrograde/ antegrade edema reduction massage   Joint Mobilization knee extension grade 3 with tibial external rotation  using belt   Passive ROM knee extension overpressure 3 bouts for 30 seconds     [employer]   Edema R knee                PT Education - 11/20/15 1608    Education provided Yes   Education Details discussed it would benefit her to set up an appointment with her physician.   Person(s) Educated Patient   Methods Explanation;Verbal cues;Handout   Comprehension Verbalized understanding;Verbal cues required;Tactile cues required  PT Short Term Goals - 10/30/15 1313      PT SHORT TERM GOAL #1   Title pt will be I with inital HEP (10/09/2015)   Time 3   Period Weeks   Status Achieved     PT SHORT TERM GOAL #2   Title pt will be able to verbalize and demo techniques to reduce R knee pain and inflammation via RICE and HEP (10/09/2015)   Period Weeks   Status Achieved           PT Long Term Goals - 11/11/15 1153      PT LONG TERM GOAL #1   Title pt will be I with advance HEP as of last visit ( 10/30/2015)   Time 6   Period Weeks   Status On-going     PT LONG TERM GOAL #2   Title pt will improve R knee flexion to >/= 90 degrees with </= 3/10 pain to promote functional and efficient gait pattern (10/30/2015)   Time 6   Period Weeks   Status On-going     PT LONG TERM GOAL #3   Title  pt will be able to walk/ stand >/= 15 min and navigate >/= 10 sets reciprocally with =1 HHA on railing with no AD and </= 3/10 pain to promote functional mobility (10/30/2015)   Baseline walks 15 minutes, unable to climb stairs reciprocally, unable to descend reciprocally   Status Partially Met     PT LONG TERM GOAL #4   Title increase FOTO score to >/= 50% limited to demonstate improvement in function (10/30/2015)   Baseline 56% improved from 60% limited   Time 6   Period Weeks   Status On-going     PT LONG TERM GOAL #5   Title increase R knee strenght to >/= 4-/5 with </=3/10 pain to promote safety with CKC activities (10/30/2015)   Time 6   Period Weeks   Status Unable to assess               Plan - 11/20/15 1614    Clinical Impression Statement pt continues to utilize both crutches to ambulate for safety. continued working on ROM, and edema reduction massage techniques. used KT tape to conitnue with edema reduction and MHP post session for pain with elevation.    PT Next Visit Plan Reassess pain l; Continue ROM and strengthening, knee extension, balance training, gait training   Consulted and Agree with Plan of Care Patient      Patient will benefit from skilled therapeutic intervention in order to improve the following deficits and impairments:  Pain, Improper body mechanics, Postural dysfunction, Increased edema, Decreased strength, Hypomobility, Decreased endurance, Decreased activity tolerance, Decreased balance, Increased fascial restricitons, Decreased range of motion, Abnormal gait  Visit Diagnosis: Pain in right knee  Stiffness of right knee, not elsewhere classified  Localized edema  Other abnormalities of gait and mobility     Problem List There are no active problems to display for this patient.  Starr Lake PT, DPT, LAT, ATC  11/20/15  4:21 PM      [facility] [address] [place],  [place], [address] Phone: [phone]   Fax:  [phone]  Name: Colleen Wells MRN: [id number] Date of Birth: 09-08-1985

## 2015-11-22 ENCOUNTER — Ambulatory Visit: Payer: Medicaid Other | Admitting: Physical Therapy

## 2015-11-22 DIAGNOSIS — R600 Localized edema: Secondary | ICD-10-CM

## 2015-11-22 DIAGNOSIS — M25561 Pain in right knee: Secondary | ICD-10-CM | POA: Diagnosis not present

## 2015-11-22 DIAGNOSIS — R2689 Other abnormalities of gait and mobility: Secondary | ICD-10-CM

## 2015-11-22 DIAGNOSIS — M25661 Stiffness of right knee, not elsewhere classified: Secondary | ICD-10-CM

## 2015-11-22 NOTE — Therapy (Signed)
[place], [place], [address] Phone: [phone]   Fax:  [phone]  Physical Therapy Treatment  Patient Details  Name: Colleen Wells MRN: [id number] Date of Birth: 31-Oct-1985 Referring Provider: Meredith Pel MD  Encounter Date: 11/22/2015      PT End of Session - 11/22/15 1149    Visit Number 22   Number of Visits 28   Date for PT Re-Evaluation 12/25/15   Authorization Type Self-pay / Medicaid not authorized   PT Start Time 1105   PT Stop Time 1200   PT Time Calculation (min) 55 min   Activity Tolerance Patient tolerated treatment well   Behavior During Therapy [facility] for tasks assessed/performed      Past Medical History:  Diagnosis Date  . Arthrofibrosis of knee joint    right  . Medical history non-contributory   . Meniscus tear     Past Surgical History:  Procedure Laterality Date  . ANTERIOR CRUCIATE LIGAMENT REPAIR Right 09/03/2015   Procedure: REPAIR ANTERIOR CRUCIATE LIGAMENT  REPAIR OF AVULSION;  Surgeon: Meredith Pel, MD;  Location: [place];  Service: Orthopedics;  Laterality: Right;  . CESAREAN SECTION    . KNEE CLOSED REDUCTION Right 10/08/2015   Procedure: CLOSED MANIPULATION KNEE UNDER ANESTHESIA;  Surgeon: Meredith Pel, MD;  Location: [place];  Service: Orthopedics;  Laterality: Right;  . MENISCUS REPAIR Right 09/03/2015   Procedure: REPAIR OF MENISCUS LATERAL;  Surgeon: Meredith Pel, MD;  Location: [place];  Service: Orthopedics;  Laterality: Right;    There were no vitals filed for this visit.      Subjective Assessment - 11/22/15 1105    Subjective "doing alittle better today, less pain just soreness" reported decreased swelling since last session.    Currently in Pain? Yes   Pain Score 4    Pain Location Knee   Pain Orientation Right   Pain Descriptors / Indicators Throbbing   Pain Type Acute pain   Pain Onset More than a month ago   Pain Frequency  Intermittent            [facility] PT Assessment - 11/22/15 1126      AROM   Right Knee Extension -12   Right Knee Flexion 112                     [facility] Adult PT Treatment/Exercise - 11/22/15 1113      Knee/Hip Exercises: Aerobic   Nustep 8 min LE only  starting at L4 increasing 1 level every 2 min     Knee/Hip Exercises: Supine   Quad Sets AROM;1 set;10 reps   Short Arc Quad Sets AROM;Right;2 sets;15 reps   Straight Leg Raises AROM;Strengthening;Right;2 sets;10 reps  with quad set reset beginning each rep   Straight Leg Raises Limitations -22 extensor lag     Knee/Hip Exercises: Sidelying   Hip ABduction AROM;Right;2 sets;15 reps     Moist Heat Therapy   Number Minutes Moist Heat 15 Minutes   Moist Heat Location Knee  with 3# over knee to facilitate extension     Electrical Stimulation   Electrical Stimulation Location right knee    Electrical Stimulation Action IFC   Electrical Stimulation Parameters L10, 100% scan, x 15 min   Electrical Stimulation Goals Pain     Manual Therapy   Joint Mobilization knee extension grade 2-3 with tibial external rotation  using belt  closely monitoring for pain  PT Short Term Goals - 10/30/15 1313      PT SHORT TERM GOAL #1   Title pt will be I with inital HEP (10/09/2015)   Time 3   Period Weeks   Status Achieved     PT SHORT TERM GOAL #2   Title pt will be able to verbalize and demo techniques to reduce R knee pain and inflammation via RICE and HEP (10/09/2015)   Period Weeks   Status Achieved           PT Long Term Goals - 11/11/15 1153      PT LONG TERM GOAL #1   Title pt will be I with advance HEP as of last visit ( 10/30/2015)   Time 6   Period Weeks   Status On-going     PT LONG TERM GOAL #2   Title pt will improve R knee flexion to >/= 90 degrees with </= 3/10 pain to promote functional and efficient gait pattern (10/30/2015)   Time 6   Period Weeks   Status On-going      PT LONG TERM GOAL #3   Title pt will be able to walk/ stand >/= 15 min and navigate >/= 10 sets reciprocally with =1 HHA on railing with no AD and </= 3/10 pain to promote functional mobility (10/30/2015)   Baseline walks 15 minutes, unable to climb stairs reciprocally, unable to descend reciprocally   Status Partially Met     PT LONG TERM GOAL #4   Title increase FOTO score to >/= 50% limited to demonstate improvement in function (10/30/2015)   Baseline 56% improved from 60% limited   Time 6   Period Weeks   Status On-going     PT LONG TERM GOAL #5   Title increase R knee strenght to >/= 4-/5 with </=3/10 pain to promote safety with CKC activities (10/30/2015)   Time 6   Period Weeks   Status Unable to assess               Plan - 11/22/15 1149    Clinical Impression Statement [name] reports some reduction in pain and swelling since last session but continues to have pain with weight bearing acitivties as well as limited extension at -12 and flexion at 112 degrees and extensor lag of 22 with SLR today. she continues to use crutches for ambulation and exhibits signicant antalgic pattern without crutches. continued supine quad exercises working extension and hip extension monitoring pt closely for pain. utilized E-stim and MHp post session for soreness.    PT Next Visit Plan see what MD said, Continue ROM and strengthening, knee extension, balance training, gait training   Consulted and Agree with Plan of Care Patient      Patient will benefit from skilled therapeutic intervention in order to improve the following deficits and impairments:  Pain, Improper body mechanics, Postural dysfunction, Increased edema, Decreased strength, Hypomobility, Decreased endurance, Decreased activity tolerance, Decreased balance, Increased fascial restricitons, Decreased range of motion, Abnormal gait  Visit Diagnosis: Pain in right knee  Stiffness of right knee, not elsewhere  classified  Localized edema  Other abnormalities of gait and mobility     Problem List There are no active problems to display for this patient.  Starr Lake PT, DPT, LAT, ATC  11/22/15  12:02 PM      [facility] [address] [place], [place], [address] Phone: [phone]   Fax:  [phone]  Name: Colleen Wells  MRN: [id number] Date of Birth: 1986/02/08

## 2015-11-25 ENCOUNTER — Ambulatory Visit (INDEPENDENT_AMBULATORY_CARE_PROVIDER_SITE_OTHER): Payer: Self-pay | Admitting: Orthopedic Surgery

## 2015-11-25 ENCOUNTER — Ambulatory Visit: Payer: Medicaid Other | Attending: Orthopedic Surgery | Admitting: Physical Therapy

## 2015-11-25 DIAGNOSIS — M25661 Stiffness of right knee, not elsewhere classified: Secondary | ICD-10-CM | POA: Diagnosis present

## 2015-11-25 DIAGNOSIS — G8929 Other chronic pain: Secondary | ICD-10-CM | POA: Insufficient documentation

## 2015-11-25 DIAGNOSIS — R600 Localized edema: Secondary | ICD-10-CM | POA: Diagnosis present

## 2015-11-25 DIAGNOSIS — M25561 Pain in right knee: Secondary | ICD-10-CM | POA: Diagnosis present

## 2015-11-25 DIAGNOSIS — R2689 Other abnormalities of gait and mobility: Secondary | ICD-10-CM

## 2015-11-25 NOTE — Therapy (Signed)
[place], [place], [address] Phone: [phone]   Fax:  [phone]  Physical Therapy Treatment  Patient Details  Name: Colleen Wells MRN: [id number] Date of Birth: [birth year] Referring Provider: Meredith Pel MD  Encounter Date: 11/25/2015      PT End of Session - 11/25/15 1242    Visit Number 23   Number of Visits 28   Date for PT Re-Evaluation 12/25/15   Authorization Type Self-pay / Medicaid not authorized   PT Start Time 1155   PT Stop Time 1244   PT Time Calculation (min) 49 min   Activity Tolerance Patient tolerated treatment well   Behavior During Therapy [facility] for tasks assessed/performed      Past Medical History:  Diagnosis Date  . Arthrofibrosis of knee joint    right  . Medical history non-contributory   . Meniscus tear     Past Surgical History:  Procedure Laterality Date  . ANTERIOR CRUCIATE LIGAMENT REPAIR Right 09/03/2015   Procedure: REPAIR ANTERIOR CRUCIATE LIGAMENT  REPAIR OF AVULSION;  Surgeon: Meredith Pel, MD;  Location: [place];  Service: Orthopedics;  Laterality: Right;  . CESAREAN SECTION    . KNEE CLOSED REDUCTION Right 10/08/2015   Procedure: CLOSED MANIPULATION KNEE UNDER ANESTHESIA;  Surgeon: Meredith Pel, MD;  Location: [place];  Service: Orthopedics;  Laterality: Right;  . MENISCUS REPAIR Right 09/03/2015   Procedure: REPAIR OF MENISCUS LATERAL;  Surgeon: Meredith Pel, MD;  Location: [place];  Service: Orthopedics;  Laterality: Right;    There were no vitals filed for this visit.      Subjective Assessment - 11/25/15 1151    Subjective "I saw the MD and they said that I had a bone bruise and that everything was still doing good"   Currently in Pain? Yes   Pain Score 2    Pain Orientation Right   Pain Descriptors / Indicators Aching   Pain Type Acute pain            [facility] PT Assessment - 11/25/15 0001      AROM   Right Knee  Extension -10   Right Knee Flexion 116                     [facility] Adult PT Treatment/Exercise - 11/25/15 1154      Knee/Hip Exercises: Stretches   Active Hamstring Stretch 2 reps;30 seconds     Knee/Hip Exercises: Aerobic   Recumbent Bike L4 x 6 min     Knee/Hip Exercises: Standing   Step Down 2 sets;10 reps;Step Height: 4"  with TKE usingblue theraband     Knee/Hip Exercises: Supine   Short Arc Quad Sets AROM;Right;2 sets;15 reps     Knee/Hip Exercises: Sidelying   Hip ABduction AROM;Right;2 sets;15 reps     Moist Heat Therapy   Number Minutes Moist Heat 10 Minutes   Moist Heat Location Knee     Manual Therapy   Manual Therapy Soft tissue mobilization   Manual therapy comments manual trigger point release x 5 along hamstrings   Soft tissue mobilization IASTM along hamsrings  while pt is in prone hang position             Balance Exercises - 11/25/15 1239      Balance Exercises: Standing   SLS Eyes open;Eyes closed;3 reps;Solid surface;30 secs             PT Short Term Goals -  10/30/15 1313      PT SHORT TERM GOAL #1   Title pt will be I with inital HEP (10/09/2015)   Time 3   Period Weeks   Status Achieved     PT SHORT TERM GOAL #2   Title pt will be able to verbalize and demo techniques to reduce R knee pain and inflammation via RICE and HEP (10/09/2015)   Period Weeks   Status Achieved           PT Long Term Goals - 11/11/15 1153      PT LONG TERM GOAL #1   Title pt will be I with advance HEP as of last visit ( 10/30/2015)   Time 6   Period Weeks   Status On-going     PT LONG TERM GOAL #2   Title pt will improve R knee flexion to >/= 90 degrees with </= 3/10 pain to promote functional and efficient gait pattern (10/30/2015)   Time 6   Period Weeks   Status On-going     PT LONG TERM GOAL #3   Title pt will be able to walk/ stand >/= 15 min and navigate >/= 10 sets reciprocally with =1 HHA on railing with no AD and </= 3/10  pain to promote functional mobility (10/30/2015)   Baseline walks 15 minutes, unable to climb stairs reciprocally, unable to descend reciprocally   Status Partially Met     PT LONG TERM GOAL #4   Title increase FOTO score to >/= 50% limited to demonstate improvement in function (10/30/2015)   Baseline 56% improved from 60% limited   Time 6   Period Weeks   Status On-going     PT LONG TERM GOAL #5   Title increase R knee strenght to >/= 4-/5 with </=3/10 pain to promote safety with CKC activities (10/30/2015)   Time 6   Period Weeks   Status Unable to assess               Plan - 11/25/15 1243    Clinical Impression Statement [name] reports that she saw her MD and states that she has a bone bruise and was given medication for swelling. Focused on manual to decrease tightness of the hamstrings and performed exercises which she was able to perform without increased pain. she increased extension to -10 and fleixon to 116 degrees today.    PT Next Visit Plan Continue ROM and strengthening, knee extension, balance training, gait training.   Consulted and Agree with Plan of Care Patient      Patient will benefit from skilled therapeutic intervention in order to improve the following deficits and impairments:  Pain, Improper body mechanics, Postural dysfunction, Increased edema, Decreased strength, Hypomobility, Decreased endurance, Decreased activity tolerance, Decreased balance, Increased fascial restricitons, Decreased range of motion, Abnormal gait  Visit Diagnosis: Right knee pain, unspecified chronicity  Stiffness of right knee, not elsewhere classified  Localized edema  Other abnormalities of gait and mobility     Problem List There are no active problems to display for this patient.  Starr Lake PT, DPT, LAT, ATC  11/25/15  12:47 PM      [facility] [address] [place], [place], [address] Phone:  [phone]   Fax:  [phone]  Name: Colleen Wells MRN: [id number] Date of Birth: May 23, 1985

## 2015-11-27 ENCOUNTER — Ambulatory Visit: Payer: Medicaid Other | Admitting: Physical Therapy

## 2015-11-27 DIAGNOSIS — M25661 Stiffness of right knee, not elsewhere classified: Secondary | ICD-10-CM

## 2015-11-27 DIAGNOSIS — R2689 Other abnormalities of gait and mobility: Secondary | ICD-10-CM

## 2015-11-27 DIAGNOSIS — M25561 Pain in right knee: Secondary | ICD-10-CM

## 2015-11-27 DIAGNOSIS — R600 Localized edema: Secondary | ICD-10-CM

## 2015-11-27 NOTE — Therapy (Signed)
[place], [place], [address] Phone: [phone]   Fax:  [phone]  Physical Therapy Treatment  Patient Details  Name: Colleen Wells MRN: [id number] Date of Birth: Oct 26, 1985 Referring Provider: Meredith Pel MD  Encounter Date: 11/27/2015      PT End of Session - 11/27/15 1156    Visit Number 24   Number of Visits 28   Date for PT Re-Evaluation 12/25/15   PT Start Time 9211  pt arrived 11 minutes late today   PT Stop Time 1235   PT Time Calculation (min) 39 min   Activity Tolerance Patient tolerated treatment well   Behavior During Therapy [facility] for tasks assessed/performed      Past Medical History:  Diagnosis Date  . Arthrofibrosis of knee joint    right  . Medical history non-contributory   . Meniscus tear     Past Surgical History:  Procedure Laterality Date  . ANTERIOR CRUCIATE LIGAMENT REPAIR Right 09/03/2015   Procedure: REPAIR ANTERIOR CRUCIATE LIGAMENT  REPAIR OF AVULSION;  Surgeon: Meredith Pel, MD;  Location: [place];  Service: Orthopedics;  Laterality: Right;  . CESAREAN SECTION    . KNEE CLOSED REDUCTION Right 10/08/2015   Procedure: CLOSED MANIPULATION KNEE UNDER ANESTHESIA;  Surgeon: Meredith Pel, MD;  Location: [place];  Service: Orthopedics;  Laterality: Right;  . MENISCUS REPAIR Right 09/03/2015   Procedure: REPAIR OF MENISCUS LATERAL;  Surgeon: Meredith Pel, MD;  Location: [place];  Service: Orthopedics;  Laterality: Right;    There were no vitals filed for this visit.      Subjective Assessment - 11/27/15 1157    Subjective "I am feeling alot better today, didn't get a chance to see the Md but got it rescheduled"   Currently in Pain? No/denies            [facility] PT Assessment - 11/27/15 0001      AROM   Right Knee Extension -8     Strength   Left Knee Flexion 5/5   Left Knee Extension 4+/5                     [facility] Adult PT  Treatment/Exercise - 11/27/15 1158      Knee/Hip Exercises: Stretches   Active Hamstring Stretch 3 reps;30 seconds     Knee/Hip Exercises: Aerobic   Elliptical L 1 x 5 min     Knee/Hip Exercises: Standing   Step Down 2 sets;10 reps;Step Height: 4"   Gait Training 8 x 30 ft utilizing heel strike / toe off with equal stance time bil , verbal/ visual cues for form.  changing speeds / verbal cues to stop as soon as she starts limping.      Manual Therapy   Manual therapy comments manual trigger point release x 5 along hamstrings   Soft tissue mobilization IASTM along hamsrings   Passive ROM knee extension overpressure 5 bouts for 30 seconds                PT Education - 11/27/15 1235    Education provided Yes   Education Details discussed gait training utilizing heel strike / toe off with equal stance time bil. Practicing at home utilizing walking without a limp starting slow and gradually increasing speed, if limping starts stop and reset.    Person(s) Educated Patient   Methods Explanation;Verbal cues;Demonstration   Comprehension Verbalized understanding;Verbal cues required;Returned demonstration  PT Short Term Goals - 10/30/15 1313      PT SHORT TERM GOAL #1   Title pt will be I with inital HEP (10/09/2015)   Time 3   Period Weeks   Status Achieved     PT SHORT TERM GOAL #2   Title pt will be able to verbalize and demo techniques to reduce R knee pain and inflammation via RICE and HEP (10/09/2015)   Period Weeks   Status Achieved           PT Long Term Goals - 11/27/15 1240      PT LONG TERM GOAL #1   Title pt will be I with advance HEP as of last visit ( 10/30/2015)   Time 6   Period Weeks   Status On-going     PT LONG TERM GOAL #2   Title pt will improve R knee flexion to >/= 90 degrees with </= 3/10 pain to promote functional and efficient gait pattern (10/30/2015)   Time 6   Period Weeks   Status Achieved     PT LONG TERM GOAL #3   Title  pt will be able to walk/ stand >/= 15 min and navigate >/= 10 sets reciprocally with =1 HHA on railing with no AD and </= 3/10 pain to promote functional mobility (10/30/2015)   Time 6   Period Weeks   Status Partially Met     PT LONG TERM GOAL #4   Title increase FOTO score to >/= 50% limited to demonstate improvement in function (10/30/2015)   Time 6   Period Weeks   Status Unable to assess     PT LONG TERM GOAL #5   Title increase R knee strenght to >/= 4-/5 with </=3/10 pain to promote safety with CKC activities (10/30/2015)   Time 6   Period Weeks   Status On-going               Plan - 11/27/15 1237    Clinical Impression Statement pt arrived 11 minutes late today. continued work on knee extension via soft tissue work/ mobs and stretching which she improved to -8 degrees. also retraining gait with heel strike/ toe off and equal stance time with gradual increase in speed. she reported no pain post session and declined MHP.    PT Treatment/Interventions ADLs/Self Care Home Management;Cryotherapy;Electrical Stimulation;Iontophoresis 41m/ml Dexamethasone;Moist Heat;Ultrasound;Taping;Patient/family education;Gait training;Stair training;Manual techniques;Passive range of motion;Therapeutic activities;Therapeutic exercise;Balance training;Neuromuscular re-education   PT Next Visit Plan balance training, gait training, CKC hip/ knee strengthening/ progression, extension ROM    PT Home Exercise Plan gait training   Consulted and Agree with Plan of Care Patient      Patient will benefit from skilled therapeutic intervention in order to improve the following deficits and impairments:  Pain, Improper body mechanics, Postural dysfunction, Increased edema, Decreased strength, Hypomobility, Decreased endurance, Decreased activity tolerance, Decreased balance, Increased fascial restricitons, Decreased range of motion, Abnormal gait  Visit Diagnosis: Right knee pain, unspecified  chronicity  Stiffness of right knee, not elsewhere classified  Localized edema  Other abnormalities of gait and mobility     Problem List There are no active problems to display for this patient.  KStarr LakePT, DPT, LAT, ATC  11/27/15  12:47 PM      [facility]GC[place] [address]Phone: [phone]  Fax:  [phone] Name: SAili CasillasMRN: [id number] of Birth: 404-25-1987

## 2015-11-29 ENCOUNTER — Ambulatory Visit: Payer: Medicaid Other | Admitting: Physical Therapy

## 2015-11-29 DIAGNOSIS — M25561 Pain in right knee: Secondary | ICD-10-CM

## 2015-11-29 DIAGNOSIS — R600 Localized edema: Secondary | ICD-10-CM

## 2015-11-29 DIAGNOSIS — M25661 Stiffness of right knee, not elsewhere classified: Secondary | ICD-10-CM

## 2015-11-29 DIAGNOSIS — R2689 Other abnormalities of gait and mobility: Secondary | ICD-10-CM

## 2015-11-29 NOTE — Therapy (Signed)
[facility] [address] [place], [place], [address] Phone: [phone]   Fax:  [phone]  Physical Therapy Treatment  Patient Details  Name: Colleen Wells MRN: [id number] Date of Birth: 11/09/1985 Referring Provider: Gregory Scott Dean MD  Encounter Date: 11/29/2015      PT End of Session - 11/29/15 1108    Visit Number 25   Number of Visits 28   Date for PT Re-Evaluation 12/25/15   Authorization Type Self-pay / Medicaid not authorized   PT Start Time 1104   PT Stop Time 1146   PT Time Calculation (min) 42 min      Past Medical History:  Diagnosis Date  . Arthrofibrosis of knee joint    right  . Medical history non-contributory   . Meniscus tear     Past Surgical History:  Procedure Laterality Date  . ANTERIOR CRUCIATE LIGAMENT REPAIR Right 09/03/2015   Procedure: REPAIR ANTERIOR CRUCIATE LIGAMENT  REPAIR OF AVULSION;  Surgeon: Scott Gregory Dean, MD;  [facility];  Service: Orthopedics;  Laterality: Right;  . CESAREAN SECTION    . KNEE CLOSED REDUCTION Right 10/08/2015   Procedure: CLOSED MANIPULATION KNEE UNDER ANESTHESIA;  Surgeon: Scott Gregory Dean, MD;  [facility];  Service: Orthopedics;  Laterality: Right;  . MENISCUS REPAIR Right 09/03/2015   Procedure: REPAIR OF MENISCUS LATERAL;  Surgeon: Scott Gregory Dean, MD;  [facility];  Service: Orthopedics;  Laterality: Right;    There were no vitals filed for this visit.      Subjective Assessment - 11/29/15 1302    Currently in Pain? No/denies                         [facility] Adult PT Treatment/Exercise - 11/29/15 0001      Knee/Hip Exercises: Aerobic   Recumbent Bike L3 x 5 min     Knee/Hip Exercises: Standing   Other Standing Knee Exercises standing termainl knee ext during russian stim    Other Standing Knee Exercises Sled push and pull focusing on Terminal knee extension; resisted gait forward and retro.      Knee/Hip  Exercises: Seated   Long Arc Quad 2 sets;10 reps   Long Arc Quad Limitations with red band for HEP    Other Seated Knee/Hip Exercises Seated quad sets with foot on floor during russian stim    Hamstring Limitations --     Knee/Hip Exercises: Supine   Short Arc Quad Sets Limitations during russian stim      Electrical Stimulation   Electrical Stimulation Location R knee   Electrical Stimulation Action Russian    Electrical Stimulation Parameters L 30    Electrical Stimulation Goals Strength;Neuromuscular facilitation                  PT Short Term Goals - 10/30/15 1313      PT SHORT TERM GOAL #1   Title pt will be I with inital HEP (10/09/2015)   Time 3   Period Weeks   Status Achieved     PT SHORT TERM GOAL #2   Title pt will be able to verbalize and demo techniques to reduce R knee pain and inflammation via RICE and HEP (10/09/2015)   Period Weeks   Status Achieved           PT Long Term Goals - 11/27/15 1240      PT LONG TERM GOAL #1   Title pt will   be I with advance HEP as of last visit ( 10/30/2015)   Time 6   Period Weeks   Status On-going     PT LONG TERM GOAL #2   Title pt will improve R knee flexion to >/= 90 degrees with </= 3/10 pain to promote functional and efficient gait pattern (10/30/2015)   Time 6   Period Weeks   Status Achieved     PT LONG TERM GOAL #3   Title pt will be able to walk/ stand >/= 15 min and navigate >/= 10 sets reciprocally with =1 HHA on railing with no AD and </= 3/10 pain to promote functional mobility (10/30/2015)   Time 6   Period Weeks   Status Partially Met     PT LONG TERM GOAL #4   Title increase FOTO score to >/= 50% limited to demonstate improvement in function (10/30/2015)   Time 6   Period Weeks   Status Unable to assess     PT LONG TERM GOAL #5   Title increase R knee strenght to >/= 4-/5 with </=3/10 pain to promote safety with CKC activities (10/30/2015)   Time 6   Period Weeks   Status On-going                Plan - 11/29/15 1259    Clinical Impression Statement Pt concerned about lack of muscle mass in right quad. We used russian stim with open and closed chain exercises. Instructed pt in seated red band LAQ for HEP. Worked on Terminal knee extension with resisted gait and sled push and pull. Heel strike and knee extension much improved at end of treatment.    PT Next Visit Plan balance training, gait training, CKC hip/ knee strengthening/ progression, extension ROM; continue [place]      Patient will benefit from skilled therapeutic intervention in order to improve the following deficits and impairments:     Visit Diagnosis: Right knee pain, unspecified chronicity  Stiffness of right knee, not elsewhere classified  Other abnormalities of gait and mobility  Localized edema     Problem List There are no active problems to display for this patient.   [name] 11/29/2015, 1:03 PM  [facility] [address] [place], [place], [address] Phone: [phone]   Fax:  [phone]  Name: Colleen Wells MRN: [id number] Date of Birth: Feb 06, 1986

## 2015-12-02 ENCOUNTER — Ambulatory Visit: Payer: Medicaid Other | Admitting: Physical Therapy

## 2015-12-02 DIAGNOSIS — M25561 Pain in right knee: Secondary | ICD-10-CM

## 2015-12-02 DIAGNOSIS — R600 Localized edema: Secondary | ICD-10-CM

## 2015-12-02 DIAGNOSIS — R2689 Other abnormalities of gait and mobility: Secondary | ICD-10-CM

## 2015-12-02 DIAGNOSIS — M25661 Stiffness of right knee, not elsewhere classified: Secondary | ICD-10-CM

## 2015-12-02 NOTE — Therapy (Signed)
[place], [place], [address] Phone: [phone]   Fax:  [phone]  Physical Therapy Treatment  Patient Details  Name: Colleen Wells MRN: [id number] Date of Birth: 1985-04-28 Referring Provider: Meredith Pel MD  Encounter Date: 12/02/2015      PT End of Session - 12/02/15 1310    Visit Number 26   Number of Visits 28   Date for PT Re-Evaluation 12/25/15   Authorization Type Self-pay / Medicaid not authorized   PT Start Time 1153   PT Stop Time 1231   PT Time Calculation (min) 38 min      Past Medical History:  Diagnosis Date  . Arthrofibrosis of knee joint    right  . Medical history non-contributory   . Meniscus tear     Past Surgical History:  Procedure Laterality Date  . ANTERIOR CRUCIATE LIGAMENT REPAIR Right 09/03/2015   Procedure: REPAIR ANTERIOR CRUCIATE LIGAMENT  REPAIR OF AVULSION;  Surgeon: Meredith Pel, MD;  Location: [place];  Service: Orthopedics;  Laterality: Right;  . CESAREAN SECTION    . KNEE CLOSED REDUCTION Right 10/08/2015   Procedure: CLOSED MANIPULATION KNEE UNDER ANESTHESIA;  Surgeon: Meredith Pel, MD;  Location: [place];  Service: Orthopedics;  Laterality: Right;  . MENISCUS REPAIR Right 09/03/2015   Procedure: REPAIR OF MENISCUS LATERAL;  Surgeon: Meredith Pel, MD;  Location: [place];  Service: Orthopedics;  Laterality: Right;    There were no vitals filed for this visit.                       [place] Adult PT Treatment/Exercise - 12/02/15 0001      Knee/Hip Exercises: Stretches   Hip Flexor Stretch 60 seconds;1 rep     Knee/Hip Exercises: Aerobic   Nustep 5 min LE only level 6      Knee/Hip Exercises: Machines for Strengthening   Cybex Leg Press 65# bilatera x 20, 35# single x 20      Knee/Hip Exercises: Standing   Forward Lunges Limitations split squats with foot on    Forward Step Up 3 sets;10 reps   Forward Step Up  Limitations also on bosu focusing on mechanics , and decreaseing compensations   Other Standing Knee Exercises TKE on TM pushing belt forward   Other Standing Knee Exercises Sled push and pull focusing on Terminal knee extension; resisted gait forward and retro.   sled too hard today     Knee/Hip Exercises: Supine   Bridges Limitations x 10                   PT Short Term Goals - 10/30/15 1313      PT SHORT TERM GOAL #1   Title pt will be I with inital HEP (10/09/2015)   Time 3   Period Weeks   Status Achieved     PT SHORT TERM GOAL #2   Title pt will be able to verbalize and demo techniques to reduce R knee pain and inflammation via RICE and HEP (10/09/2015)   Period Weeks   Status Achieved           PT Long Term Goals - 11/27/15 1240      PT LONG TERM GOAL #1   Title pt will be I with advance HEP as of last visit ( 10/30/2015)   Time 6   Period Weeks   Status On-going     PT LONG  TERM GOAL #2   Title pt will improve R knee flexion to >/= 90 degrees with </= 3/10 pain to promote functional and efficient gait pattern (10/30/2015)   Time 6   Period Weeks   Status Achieved     PT LONG TERM GOAL #3   Title pt will be able to walk/ stand >/= 15 min and navigate >/= 10 sets reciprocally with =1 HHA on railing with no AD and </= 3/10 pain to promote functional mobility (10/30/2015)   Time 6   Period Weeks   Status Partially Met     PT LONG TERM GOAL #4   Title increase FOTO score to >/= 50% limited to demonstate improvement in function (10/30/2015)   Time 6   Period Weeks   Status Unable to assess     PT LONG TERM GOAL #5   Title increase R knee strenght to >/= 4-/5 with </=3/10 pain to promote safety with CKC activities (10/30/2015)   Time 6   Period Weeks   Status On-going               Plan - 12/02/15 1312    Clinical Impression Statement Focused closed chain quad strengthening today. No increased pain. Pt reports she is feeling much better since her  recent fall. She sees MD this week for follow up. She is concerned about her continued lack of knee extension.    PT Next Visit Plan CHECK GOALS; SEE WHAT MD SAID balance training, gait training, CKC hip/ knee strengthening/ progression, extension ROM; continue [place]      Patient will benefit from skilled therapeutic intervention in order to improve the following deficits and impairments:  Pain, Improper body mechanics, Postural dysfunction, Increased edema, Decreased strength, Hypomobility, Decreased endurance, Decreased activity tolerance, Decreased balance, Increased fascial restricitons, Decreased range of motion, Abnormal gait  Visit Diagnosis: Right knee pain, unspecified chronicity  Stiffness of right knee, not elsewhere classified  Other abnormalities of gait and mobility  Localized edema     Problem List There are no active problems to display for this patient.   [name], [place] 12/02/2015, 1:27 PM  [facility] [address] [place], [place], [address] Phone: [phone]   Fax:  [phone]  Name: Colleen Wells MRN: [id number] Date of Birth: 10/12/1985

## 2015-12-04 ENCOUNTER — Ambulatory Visit (INDEPENDENT_AMBULATORY_CARE_PROVIDER_SITE_OTHER): Payer: Medicaid Other | Admitting: Orthopedic Surgery

## 2015-12-04 DIAGNOSIS — S83512D Sprain of anterior cruciate ligament of left knee, subsequent encounter: Secondary | ICD-10-CM | POA: Diagnosis not present

## 2015-12-04 DIAGNOSIS — M24661 Ankylosis, right knee: Secondary | ICD-10-CM | POA: Diagnosis not present

## 2015-12-04 DIAGNOSIS — M23331 Other meniscus derangements, other medial meniscus, right knee: Secondary | ICD-10-CM

## 2015-12-11 ENCOUNTER — Ambulatory Visit: Payer: Medicaid Other | Admitting: Physical Therapy

## 2015-12-11 DIAGNOSIS — G8929 Other chronic pain: Secondary | ICD-10-CM

## 2015-12-11 DIAGNOSIS — M25561 Pain in right knee: Secondary | ICD-10-CM | POA: Diagnosis not present

## 2015-12-11 DIAGNOSIS — R2689 Other abnormalities of gait and mobility: Secondary | ICD-10-CM

## 2015-12-11 DIAGNOSIS — M25661 Stiffness of right knee, not elsewhere classified: Secondary | ICD-10-CM

## 2015-12-11 DIAGNOSIS — R600 Localized edema: Secondary | ICD-10-CM

## 2015-12-11 NOTE — Therapy (Signed)
[place], [place], [address] Phone: [phone]   Fax:  [phone]  Physical Therapy Treatment / Re-certification  Patient Details  Name: Colleen Wells MRN: [id number] Date of Birth: Jun 02, 1985 Referring Provider: Meredith Pel MD  Encounter Date: 12/11/2015      PT End of Session - 12/11/15 1618    Visit Number 27   Number of Visits 35   Date for PT Re-Evaluation 01/08/16   Authorization Type Self-pay / Medicaid not authorized   PT Start Time 1330   PT Stop Time 1420   PT Time Calculation (min) 50 min   Activity Tolerance Patient tolerated treatment well   Behavior During Therapy [facility] for tasks assessed/performed      Past Medical History:  Diagnosis Date  . Arthrofibrosis of knee joint    right  . Medical history non-contributory   . Meniscus tear     Past Surgical History:  Procedure Laterality Date  . ANTERIOR CRUCIATE LIGAMENT REPAIR Right 09/03/2015   Procedure: REPAIR ANTERIOR CRUCIATE LIGAMENT  REPAIR OF AVULSION;  Surgeon: Meredith Pel, MD;  Location: [place];  Service: Orthopedics;  Laterality: Right;  . CESAREAN SECTION    . KNEE CLOSED REDUCTION Right 10/08/2015   Procedure: CLOSED MANIPULATION KNEE UNDER ANESTHESIA;  Surgeon: Meredith Pel, MD;  Location: [place];  Service: Orthopedics;  Laterality: Right;  . MENISCUS REPAIR Right 09/03/2015   Procedure: REPAIR OF MENISCUS LATERAL;  Surgeon: Meredith Pel, MD;  Location: [place];  Service: Orthopedics;  Laterality: Right;    There were no vitals filed for this visit.      Subjective Assessment - 12/11/15 1334    Subjective "saw MD and he wants me to continue to work on the extension, but he is pleased with her progress"   Currently in Pain? No/denies   Pain Score 0-No pain   Pain Location Knee   Pain Orientation Right            [facility] PT Assessment - 12/11/15 0001      AROM   Right Knee Extension -7    Right Knee Flexion 118     Strength   Left Knee Flexion 5/5   Left Knee Extension 4+/5                     [facility] Adult PT Treatment/Exercise - 12/11/15 0001      Moist Heat Therapy   Number Minutes Moist Heat 10 Minutes   Moist Heat Location --  prone knee hang with 5#     Manual Therapy   Joint Mobilization knee extension grade 2-3 with tibial external rotation  using belt, superior patellar mobs grade 3   Soft tissue mobilization IASTM along hamsrings   Passive ROM knee extension overpressure 5 bouts for 30 seconds          Trigger Point Dry Needling - 12/11/15 1457    Consent Given? Yes   Education Handout Provided Yes   Muscles Treated Upper Body --  hamstrings              PT Education - 12/11/15 1616    Education provided Yes   Education Details anatomy education regarding formation of trigger points and referral patterns/ tightness of muscles limiting motion. benefits of DN with regard to trigger points, what to expect and after care.    Person(s) Educated Patient   Methods Explanation;Verbal cues   Comprehension Verbalized understanding;Verbal  cues required          PT Short Term Goals - 10/30/15 1313      PT SHORT TERM GOAL #1   Title pt will be I with inital HEP (10/09/2015)   Time 3   Period Weeks   Status Achieved     PT SHORT TERM GOAL #2   Title pt will be able to verbalize and demo techniques to reduce R knee pain and inflammation via RICE and HEP (10/09/2015)   Period Weeks   Status Achieved           PT Long Term Goals - 12/11/15 1627      PT LONG TERM GOAL #1   Title pt will be I with advance HEP as of last visit ( 10/30/2015)   Time 6   Period Weeks   Status On-going     PT LONG TERM GOAL #2   Title pt will improve R knee flexion to >/= 90 degrees with </= 3/10 pain to promote functional and efficient gait pattern (10/30/2015)   Time 6   Period Weeks   Status Achieved     PT LONG TERM GOAL #3   Title pt will  be able to walk/ stand >/= 15 min and navigate >/= 10 sets reciprocally with =1 HHA on railing with no AD and </= 3/10 pain to promote functional mobility (10/30/2015)   Time 6   Period Weeks   Status Partially Met     PT LONG TERM GOAL #4   Title increase FOTO score to >/= 50% limited to demonstate improvement in function (10/30/2015)   Time 6   Period Weeks   Status Unable to assess     PT LONG TERM GOAL #5   Title increase R knee strenght to >/= 4-/5 with </=3/10 pain to promote safety with CKC activities (10/30/2015)   Time 6   Period Weeks   Status On-going               Plan - 12/11/15 1621    Clinical Impression Statement Mrs. Yazdi has made great progress with PT but continues to demonstrate limited knee extension by -7 following todays session. performed DN on the R hamstring, followed soft tissue work and mobs to focus on extension. she reports seeing the MD which she provided a new script for extension work and may get a JAZ splint to work on extension if she isn't making any progress in the next couple of visits.    PT Frequency 2x / week   PT Duration 4 weeks   PT Treatment/Interventions ADLs/Self Care Home Management;Cryotherapy;Electrical Stimulation;Iontophoresis 53m/ml Dexamethasone;Moist Heat;Ultrasound;Taping;Patient/family education;Gait training;Stair training;Manual techniques;Passive range of motion;Therapeutic activities;Therapeutic exercise;Balance training;Neuromuscular re-education   PT Next Visit Plan assess response to DN,  balance training, gait training, CKC hip/ knee strengthening/ progression, extension ROM; continue [place]  Consulted and Agree with Plan of Care Patient;Family member/caregiver   Family Member Consulted significant other      Patient will benefit from skilled therapeutic intervention in order to improve the following deficits and impairments:  Pain, Improper body mechanics, Postural dysfunction, Increased edema, Decreased strength,  Hypomobility, Decreased endurance, Decreased activity tolerance, Decreased balance, Increased fascial restricitons, Decreased range of motion, Abnormal gait  Visit Diagnosis: Right knee pain, unspecified chronicity - Plan: PT plan of care cert/re-cert  Stiffness of right knee, not elsewhere classified - Plan: PT plan of care cert/re-cert  Other abnormalities of gait and mobility - Plan: PT plan of care  cert/re-cert  Localized edema - Plan: PT plan of care cert/re-cert  Chronic pain of right knee - Plan: PT plan of care cert/re-cert     Problem List There are no active problems to display for this patient.  Starr Lake PT, DPT, LAT, ATC  12/11/15  4:34 PM      [facility] [address] [place], [place], [address] Phone: [phone]   Fax:  [phone]  Name: Colleen Wells MRN: [id number] Date of Birth: Dec 18, 1985

## 2015-12-16 ENCOUNTER — Ambulatory Visit: Payer: Medicaid Other | Admitting: Physical Therapy

## 2015-12-16 DIAGNOSIS — R600 Localized edema: Secondary | ICD-10-CM

## 2015-12-16 DIAGNOSIS — M25561 Pain in right knee: Secondary | ICD-10-CM | POA: Diagnosis not present

## 2015-12-16 DIAGNOSIS — G8929 Other chronic pain: Secondary | ICD-10-CM

## 2015-12-16 DIAGNOSIS — M25661 Stiffness of right knee, not elsewhere classified: Secondary | ICD-10-CM

## 2015-12-16 DIAGNOSIS — R2689 Other abnormalities of gait and mobility: Secondary | ICD-10-CM

## 2015-12-16 NOTE — Therapy (Signed)
Colleen Wells, [place], [address] Phone: [phone]   Fax:  [phone]  Physical Therapy Treatment  Patient Details  Name: Colleen Wells MRN: [id number] Date of Birth: 1985/10/28 Referring Provider: Meredith Pel MD  Encounter Date: 12/16/2015      PT End of Session - 12/16/15 1249    Visit Number 28   Number of Visits 35   Date for PT Re-Evaluation 01/08/16   Authorization Type Self-pay / Medicaid not authorized   PT Start Time 1152   PT Stop Time 1250   PT Time Calculation (min) 58 min   Activity Tolerance Patient tolerated treatment well   Behavior During Therapy [facility] for tasks assessed/performed      Past Medical History:  Diagnosis Date  . Arthrofibrosis of knee joint    right  . Medical history non-contributory   . Meniscus tear     Past Surgical History:  Procedure Laterality Date  . ANTERIOR CRUCIATE LIGAMENT REPAIR Right 09/03/2015   Procedure: REPAIR ANTERIOR CRUCIATE LIGAMENT  REPAIR OF AVULSION;  Surgeon: Meredith Pel, MD;  Location: [place];  Service: Orthopedics;  Laterality: Right;  . CESAREAN SECTION    . KNEE CLOSED REDUCTION Right 10/08/2015   Procedure: CLOSED MANIPULATION KNEE UNDER ANESTHESIA;  Surgeon: Meredith Pel, MD;  Location: [place];  Service: Orthopedics;  Laterality: Right;  . MENISCUS REPAIR Right 09/03/2015   Procedure: REPAIR OF MENISCUS LATERAL;  Surgeon: Meredith Pel, MD;  Location: [place];  Service: Orthopedics;  Laterality: Right;    There were no vitals filed for this visit.      Subjective Assessment - 12/16/15 1154    Subjective "The DN was a weird, but I do feel like I have made some progress, but I also worked very hard on it this weekend"   Currently in Pain? No/denies            [facility] PT Assessment - 12/16/15 1200      Observation/Other Assessments   Focus on Therapeutic Outcomes (FOTO)  40% limited      AROM   Right  Knee Extension -9                     [facility] Adult PT Treatment/Exercise - 12/16/15 0001      Knee/Hip Exercises: Aerobic   Elliptical L6 x 5 min     Knee/Hip Exercises: Machines for Strengthening   Cybex Leg Press 40# up with both down with RLE only     Moist Heat Therapy   Number Minutes Moist Heat 10 Minutes   Moist Heat Location Knee  prone with knee hanging using 6# over hamstring     Manual Therapy   Joint Mobilization knee extension grade 2-3 with tibial external rotation  using belt, superior patellar mobs grade 3   Soft tissue mobilization IASTM along hamsrings   Passive ROM knee extension overpressure 10 bouts for 30 seconds, using mob belt under proximal tibia while pushing down on distal femur          Trigger Point Dry Needling - 12/16/15 1210    Consent Given? Yes   Education Handout Provided Yes  given previously   Muscles Treated Lower Body Hamstring   Hamstring Response Twitch response elicited;Palpable increased muscle length                PT Short Term Goals - 10/30/15 1313      PT SHORT TERM  GOAL #1   Title pt will be I with inital HEP (10/09/2015)   Time 3   Period Weeks   Status Achieved     PT SHORT TERM GOAL #2   Title pt will be able to verbalize and demo techniques to reduce R knee pain and inflammation via RICE and HEP (10/09/2015)   Period Weeks   Status Achieved           PT Long Term Goals - 12/11/15 1627      PT LONG TERM GOAL #1   Title pt will be I with advance HEP as of last visit ( 10/30/2015)   Time 6   Period Weeks   Status On-going     PT LONG TERM GOAL #2   Title pt will improve R knee flexion to >/= 90 degrees with </= 3/10 pain to promote functional and efficient gait pattern (10/30/2015)   Time 6   Period Weeks   Status Achieved     PT LONG TERM GOAL #3   Title pt will be able to walk/ stand >/= 15 min and navigate >/= 10 sets reciprocally with =1 HHA on railing with no AD and </= 3/10 pain to  promote functional mobility (10/30/2015)   Time 6   Period Weeks   Status Partially Met     PT LONG TERM GOAL #4   Title increase FOTO score to >/= 50% limited to demonstate improvement in function (10/30/2015)   Time 6   Period Weeks   Status Unable to assess     PT LONG TERM GOAL #5   Title increase R knee strenght to >/= 4-/5 with </=3/10 pain to promote safety with CKC activities (10/30/2015)   Time 6   Period Weeks   Status On-going               Plan - 12/16/15 1246    Clinical Impression Statement Continued focusing on Agressive ROM for knee extension working DN over hamstrings followed with IASTM. grade 3-4 extension mobs using belt with quad set. she was able to perform exercise and leg press exhibit fatigue with Single leg lowering. continued heating hamstring while performing prone hang with weight.    PT Next Visit Plan assess response to DN,  balance training, gait training, CKC hip/ knee strengthening/ progression, extension ROM; continue [place]   Consulted and Agree with Plan of Care Patient      Patient will benefit from skilled therapeutic intervention in order to improve the following deficits and impairments:  Pain, Improper body mechanics, Postural dysfunction, Increased edema, Decreased strength, Hypomobility, Decreased endurance, Decreased activity tolerance, Decreased balance, Increased fascial restricitons, Decreased range of motion, Abnormal gait  Visit Diagnosis: Stiffness of right knee, not elsewhere classified  Other abnormalities of gait and mobility  Localized edema  Chronic pain of right knee     Problem List There are no active problems to display for this patient.  Starr Lake PT, DPT, LAT, ATC  12/16/15  12:50 PM      [facility] [address] [place], [place], [address] Phone: [phone]   Fax:  [phone]  Name: Colleen Wells MRN: [id number] Date of Birth:  1986-02-22

## 2015-12-18 ENCOUNTER — Ambulatory Visit: Payer: Medicaid Other | Admitting: Physical Therapy

## 2015-12-23 ENCOUNTER — Ambulatory Visit: Payer: Medicaid Other | Admitting: Physical Therapy

## 2015-12-23 DIAGNOSIS — G8929 Other chronic pain: Secondary | ICD-10-CM

## 2015-12-23 DIAGNOSIS — M25561 Pain in right knee: Secondary | ICD-10-CM | POA: Diagnosis not present

## 2015-12-23 DIAGNOSIS — R600 Localized edema: Secondary | ICD-10-CM

## 2015-12-23 DIAGNOSIS — R2689 Other abnormalities of gait and mobility: Secondary | ICD-10-CM

## 2015-12-23 DIAGNOSIS — M25661 Stiffness of right knee, not elsewhere classified: Secondary | ICD-10-CM

## 2015-12-23 NOTE — Therapy (Signed)
[place], [place], [address] Phone: [phone]   Fax:  [phone]  Physical Therapy Treatment  Patient Details  Name: Colleen Wells MRN: [id number] Date of Birth: 30-Nov-1985 Referring Provider: Meredith Pel MD  Encounter Date: 12/23/2015      PT End of Session - 12/23/15 1238    Visit Number 29   Number of Visits 35   Date for PT Re-Evaluation 01/08/16   Authorization Type Self-pay / Medicaid not authorized   PT Start Time 1235   PT Stop Time 1320   PT Time Calculation (min) 45 min      Past Medical History:  Diagnosis Date  . Arthrofibrosis of knee joint    right  . Medical history non-contributory   . Meniscus tear     Past Surgical History:  Procedure Laterality Date  . ANTERIOR CRUCIATE LIGAMENT REPAIR Right 09/03/2015   Procedure: REPAIR ANTERIOR CRUCIATE LIGAMENT  REPAIR OF AVULSION;  Surgeon: Meredith Pel, MD;  Location: [place];  Service: Orthopedics;  Laterality: Right;  . CESAREAN SECTION    . KNEE CLOSED REDUCTION Right 10/08/2015   Procedure: CLOSED MANIPULATION KNEE UNDER ANESTHESIA;  Surgeon: Meredith Pel, MD;  Location: [place];  Service: Orthopedics;  Laterality: Right;  . MENISCUS REPAIR Right 09/03/2015   Procedure: REPAIR OF MENISCUS LATERAL;  Surgeon: Meredith Pel, MD;  Location: [place];  Service: Orthopedics;  Laterality: Right;    There were no vitals filed for this visit.                       [facility] Adult PT Treatment/Exercise - 12/23/15 0001      Knee/Hip Exercises: Stretches   Other Knee/Hip Stretches slant board stretch 30 sec x 2      Knee/Hip Exercises: Aerobic   Nustep 5 min LE only level 6      Knee/Hip Exercises: Machines for Strengthening   Cybex Knee Extension 5# single leg x 10, fatigues, bilateral 15#    Cybex Knee Flexion bilateral 35#, single 20# x 5 reduced to 15# x 10     Knee/Hip Exercises: Standing   Terminal Knee Extension Limitations standing with blue band 3 way x 15 each, tactile cues to prevent compensations   Lateral Step Up 2 sets;10 reps   Forward Step Up 3 sets;10 reps   Step Down 2 sets;10 reps;Step Height: 4"   SLS with knee flexed 30 sec x 2   Gait Training up and down reciprocal stairs no hand rails    Other Standing Knee Exercises resisted gait with blue band around posterior knee      Knee/Hip Exercises: Seated   Other Seated Knee/Hip Exercises Seated quad sets    Sit to Sand 10 reps  with RLE back      Manual Therapy   Joint Mobilization sitting with foot in chair A/P grade 3 mobs with strap    Passive ROM knee extension overpressure 5 bouts for 30 seconds                  PT Short Term Goals - 10/30/15 1313      PT SHORT TERM GOAL #1   Title pt will be I with inital HEP (10/09/2015)   Time 3   Period Weeks   Status Achieved     PT SHORT TERM GOAL #2   Title pt will be able to verbalize and demo techniques to reduce  R knee pain and inflammation via RICE and HEP (10/09/2015)   Period Weeks   Status Achieved           PT Long Term Goals - 12/23/15 1328      PT LONG TERM GOAL #1   Title pt will be I with advance HEP as of last visit ( 10/30/2015)   Time 6   Period Weeks   Status On-going     PT LONG TERM GOAL #2   Title pt will improve R knee flexion to >/= 90 degrees with </= 3/10 pain to promote functional and efficient gait pattern (10/30/2015)   Time 6   Period Weeks   Status Achieved     PT LONG TERM GOAL #3   Title pt will be able to walk/ stand >/= 15 min and navigate >/= 10 sets reciprocally with =1 HHA on railing with no AD and </= 3/10 pain to promote functional mobility (10/30/2015)   Baseline able to descend reciprocally with no HR   Time 6   Period Weeks   Status Achieved     PT LONG TERM GOAL #4   Title increase FOTO score to >/= 50% limited to demonstate improvement in function (10/30/2015)   Baseline 56% improved from 60%  limited   Time 6   Period Weeks   Status Unable to assess     PT LONG TERM GOAL #5   Title increase R knee strenght to >/= 4-/5 with </=3/10 pain to promote safety with CKC activities (10/30/2015)   Time 6   Period Weeks   Status Unable to assess               Plan - 12/23/15 1325    Clinical Impression Statement Pt spends 3 minutes in prine hang with weight and seated with leg propped and weight on knee for extension stretch. Some ankle soreness afterward. Encouraged her to also stretch her calf and to take breaks and pump ankles. Focused strengthening today. Encouraged pt to perform Terminal knee strengthening after prone knee hangs. Pt verbalized understanding. She is ablr to descend stairs without HR. LTG# 3 Met.    PT Next Visit Plan assess response to DN,  balance training, gait training, CKC hip/ knee strengthening/ progression, extension ROM; continue [place]      Patient will benefit from skilled therapeutic intervention in order to improve the following deficits and impairments:  Pain, Improper body mechanics, Postural dysfunction, Increased edema, Decreased strength, Hypomobility, Decreased endurance, Decreased activity tolerance, Decreased balance, Increased fascial restricitons, Decreased range of motion, Abnormal gait  Visit Diagnosis: Stiffness of right knee, not elsewhere classified  Other abnormalities of gait and mobility  Localized edema  Chronic pain of right knee  Right knee pain, unspecified chronicity     Problem List There are no active problems to display for this patient.   [name], [place] 12/23/2015, 1:30 PM  [place], [place], [address] Phone: [phone]   Fax:  [phone]  Name: [name] MRN: [id number] Date of Birth: March 19, 1985

## 2015-12-25 ENCOUNTER — Ambulatory Visit: Payer: Medicaid Other | Attending: Orthopedic Surgery | Admitting: Physical Therapy

## 2015-12-25 DIAGNOSIS — M25661 Stiffness of right knee, not elsewhere classified: Secondary | ICD-10-CM | POA: Insufficient documentation

## 2015-12-25 DIAGNOSIS — R2689 Other abnormalities of gait and mobility: Secondary | ICD-10-CM | POA: Insufficient documentation

## 2015-12-25 DIAGNOSIS — R600 Localized edema: Secondary | ICD-10-CM | POA: Insufficient documentation

## 2015-12-25 DIAGNOSIS — G8929 Other chronic pain: Secondary | ICD-10-CM | POA: Insufficient documentation

## 2015-12-25 DIAGNOSIS — M25561 Pain in right knee: Secondary | ICD-10-CM | POA: Insufficient documentation

## 2015-12-30 ENCOUNTER — Ambulatory Visit: Payer: Medicaid Other | Admitting: Physical Therapy

## 2015-12-30 DIAGNOSIS — R2689 Other abnormalities of gait and mobility: Secondary | ICD-10-CM | POA: Diagnosis present

## 2015-12-30 DIAGNOSIS — M25561 Pain in right knee: Secondary | ICD-10-CM

## 2015-12-30 DIAGNOSIS — R600 Localized edema: Secondary | ICD-10-CM

## 2015-12-30 DIAGNOSIS — G8929 Other chronic pain: Secondary | ICD-10-CM | POA: Diagnosis present

## 2015-12-30 DIAGNOSIS — M25661 Stiffness of right knee, not elsewhere classified: Secondary | ICD-10-CM

## 2015-12-30 NOTE — Therapy (Signed)
[facility] [address] [place], [place], [address] Phone: [phone]   Fax:  [phone]  Physical Therapy Treatment  Patient Details  Name: Colleen Wells Wells MRN: [id number] Date of Birth: 01/23/1986 Referring Provider: Cammy CopaGregory Scott Dean MD  Encounter Date: 12/30/2015      PT End of Session - 12/30/15 1151    Visit Number 30   Number of Visits 35   Date for PT Re-Evaluation 01/08/16   Authorization Type Self-pay / Medicaid not authorized   PT Start Time 1150   PT Stop Time 1230   PT Time Calculation (min) 40 min   Activity Tolerance Patient tolerated treatment well   Behavior During Therapy [facility] for tasks assessed/performed      Past Medical History:  Diagnosis Date  . Arthrofibrosis of knee joint    right  . Medical history non-contributory   . Meniscus tear     Past Surgical History:  Procedure Laterality Date  . ANTERIOR CRUCIATE LIGAMENT REPAIR Right 09/03/2015   Procedure: REPAIR ANTERIOR CRUCIATE LIGAMENT  REPAIR OF AVULSION;  Surgeon: Colleen Wells Colleen Dean, MD;  [facility];  Service: Orthopedics;  Laterality: Right;  . CESAREAN SECTION    . KNEE CLOSED REDUCTION Right 10/08/2015   Procedure: CLOSED MANIPULATION KNEE UNDER ANESTHESIA;  Surgeon: Colleen Wells Colleen Dean, MD;  [facility];  Service: Orthopedics;  Laterality: Right;  . MENISCUS REPAIR Right 09/03/2015   Procedure: REPAIR OF MENISCUS LATERAL;  Surgeon: Colleen Wells Colleen Dean, MD;  [facility];  Service: Orthopedics;  Laterality: Right;    There were no vitals filed for this visit.      Subjective Assessment - 12/30/15 1156    Subjective "I feel like I am able to get my knee much straighter doing the exercises that Shanda BumpsJessica gave me last time"    Currently in Pain? No/denies            [facility] PT Assessment - 12/30/15 0001      AROM   Right Knee Extension -8                     [facility] Adult PT Treatment/Exercise - 12/30/15  1154      Knee/Hip Exercises: Stretches   Active Hamstring Stretch 3 reps;30 seconds  contract / relax with 10 sec contraction   Other Knee/Hip Stretches slant board stretch 3 x 30 sec hold     Knee/Hip Exercises: Aerobic   Stepper L2 x 6 min     Knee/Hip Exercises: Machines for Strengthening   Cybex Knee Extension 10# with with both down with RLE 2 x 10   Total Gym Leg Press RLE only 2 x 10 35#     Knee/Hip Exercises: Standing   Lateral Step Up 2 sets;10 reps   Forward Step Up 2 sets;10 reps  off bosu   Step Down 2 sets;10 reps;Step Height: 6"  with TKE using black theraband   Gait Training resisted gait with blue band around posterior knee  x 5  with retro-walking x 10 in //,     Knee/Hip Exercises: Prone   Prone Knee Hang 5 minutes;Weights   Prone Knee Hang Weights (lbs) 6     Manual Therapy   Joint Mobilization mobilization with movement with forward walking utilizing black theraband in // bars   band around the proximal tibia/ fibula                  PT Short Term Goals -  10/30/15 1313      PT SHORT TERM GOAL #1   Title pt will be I with inital HEP (10/09/2015)   Time 3   Period Weeks   Status Achieved     PT SHORT TERM GOAL #2   Title pt will be able to verbalize and demo techniques to reduce R knee pain and inflammation via RICE and HEP (10/09/2015)   Period Weeks   Status Achieved           PT Long Term Goals - 12/23/15 1328      PT LONG TERM GOAL #1   Title pt will be I with advance HEP as of last visit ( 10/30/2015)   Time 6   Period Weeks   Status On-going     PT LONG TERM GOAL #2   Title pt will improve R knee flexion to >/= 90 degrees with </= 3/10 pain to promote functional and efficient gait pattern (10/30/2015)   Time 6   Period Weeks   Status Achieved     PT LONG TERM GOAL #3   Title pt will be able to walk/ stand >/= 15 min and navigate >/= 10 sets reciprocally with =1 HHA on railing with no AD and </= 3/10 pain to promote  functional mobility (10/30/2015)   Baseline able to descend reciprocally with no HR   Time 6   Period Weeks   Status Achieved     PT LONG TERM GOAL #4   Title increase FOTO score to >/= 50% limited to demonstate improvement in function (10/30/2015)   Baseline 56% improved from 60% limited   Time 6   Period Weeks   Status Unable to assess     PT LONG TERM GOAL #5   Title increase R knee strenght to >/= 4-/5 with </=3/10 pain to promote safety with CKC activities (10/30/2015)   Time 6   Period Weeks   Status Unable to assess               Plan - 12/30/15 1254    Clinical Impression Statement continued to stretch hamstrings with contract/ relax stretching and prone hangs following stair-stepping exercises. Worked on facilitating knee extension by working quads and pushing CKC exericses with focus on TKE activities.    PT Next Visit Plan forward/ retro stepping in // using black theraband, working on knee extension with CKC activities.    Consulted and Agree with Plan of Care Patient      Patient will benefit from skilled therapeutic intervention in order to improve the following deficits and impairments:     Visit Diagnosis: Stiffness of right knee, not elsewhere classified  Other abnormalities of gait and mobility  Localized edema  Chronic pain of right knee  Right knee pain, unspecified chronicity     Problem List There are no active problems to display for this patient.  Lulu RidingKristoffer Lauretta Sallas PT, DPT, LAT, ATC  12/30/15  12:59 PM      [facility] [address] [place], [place], [address] Phone: [phone]   Fax:  [phone]  Name: Colleen Wells [place] MRN: [id number] Date of Birth: 1985-11-10

## 2016-01-01 ENCOUNTER — Ambulatory Visit: Payer: Medicaid Other | Admitting: Physical Therapy

## 2016-01-04 ENCOUNTER — Encounter (HOSPITAL_COMMUNITY): Payer: Self-pay | Admitting: Orthopedic Surgery

## 2016-01-06 ENCOUNTER — Ambulatory Visit: Payer: Medicaid Other | Admitting: Physical Therapy

## 2016-01-08 ENCOUNTER — Ambulatory Visit: Payer: Medicaid Other | Admitting: Physical Therapy

## 2016-01-08 DIAGNOSIS — M25661 Stiffness of right knee, not elsewhere classified: Secondary | ICD-10-CM

## 2016-01-08 DIAGNOSIS — R2689 Other abnormalities of gait and mobility: Secondary | ICD-10-CM

## 2016-01-08 DIAGNOSIS — G8929 Other chronic pain: Secondary | ICD-10-CM

## 2016-01-08 DIAGNOSIS — R600 Localized edema: Secondary | ICD-10-CM

## 2016-01-08 DIAGNOSIS — M25561 Pain in right knee: Secondary | ICD-10-CM

## 2016-01-08 NOTE — Therapy (Signed)
[facility] [address] [place], [place], [address] Phone: [phone]   Fax:  [phone]  Physical Therapy Treatment / Re-certification  Patient Details  Name: Colleen Wells MRN: [id number] Date of Birth: Mar 04, 1985 Referring Provider: Cammy Copa MD  Encounter Date: 01/08/2016      PT End of Session - 01/08/16 1729    Visit Number 31   Number of Visits 35   Date for PT Re-Evaluation 01/29/16   Authorization Type Self-pay / Medicaid not authorized   PT Start Time 1553  pt arrived 8 minutes late   PT Stop Time 1632   PT Time Calculation (min) 39 min   Activity Tolerance Patient tolerated treatment well   Behavior During Therapy [facility] for tasks assessed/performed      Past Medical History:  Diagnosis Date  . Arthrofibrosis of knee joint    right  . Medical history non-contributory   . Meniscus tear     Past Surgical History:  Procedure Laterality Date  . ANTERIOR CRUCIATE LIGAMENT REPAIR Right 09/03/2015   Procedure: REPAIR ANTERIOR CRUCIATE LIGAMENT  REPAIR OF AVULSION;  Surgeon: Cammy Copa, MD;  Location: [facility] OR;  Service: Orthopedics;  Laterality: Right;  . CESAREAN SECTION    . KNEE CLOSED REDUCTION Right 10/08/2015   Procedure: CLOSED MANIPULATION KNEE UNDER ANESTHESIA;  Surgeon: Cammy Copa, MD;  Location: [facility] OR;  Service: Orthopedics;  Laterality: Right;  . MENISCUS REPAIR Right 09/03/2015   Procedure: REPAIR OF MENISCUS LATERAL;  Surgeon: Cammy Copa, MD;  Location: [facility] OR;  Service: Orthopedics;  Laterality: Right;    There were no vitals filed for this visit.      Subjective Assessment - 01/08/16 1558    Subjective "I continue to feel like it is getting a whole lot better, the more I do the more walking i do I think it helps"    Currently in Pain? No/denies            [facility] PT Assessment - 01/08/16 0001      AROM   Right Knee Extension -6     Strength   Left Knee  Flexion 5/5   Left Knee Extension 5/5                     [facility] Adult PT Treatment/Exercise - 01/08/16 0001      Knee/Hip Exercises: Stretches   Active Hamstring Stretch 2 reps;30 seconds   Other Knee/Hip Stretches slant board stretch 3 x 30 sec hold     Knee/Hip Exercises: Aerobic   Stepper L4 x     Knee/Hip Exercises: Machines for Strengthening   Cybex Knee Extension 10# up with both down with RLE 3 x 10     Knee/Hip Exercises: Standing   Gait Training resisted gait with black band around posterior knee  x 5  with retro-walking x 10 in //, 1/2 lunging with black band with foot externally rotated slightly 3 x 10 with TKE     Knee/Hip Exercises: Supine   Other Supine Knee/Hip Exercises foam rolling hamstring on the R 3 x 15                PT Education - 01/08/16 1729    Education provided Yes   Education Details when doing forward/ backward walking or rocking rotating the leg/ foot externally slightly to facilitate the rotation mechanism    Person(s) Educated Patient   Methods Explanation;Verbal cues   Comprehension  Verbalized understanding;Verbal cues required          PT Short Term Goals - 10/30/15 1313      PT SHORT TERM GOAL #1   Title pt will be I with inital HEP (10/09/2015)   Time 3   Period Weeks   Status Achieved     PT SHORT TERM GOAL #2   Title pt will be able to verbalize and demo techniques to reduce R knee pain and inflammation via RICE and HEP (10/09/2015)   Period Weeks   Status Achieved           PT Long Term Goals - 01/08/16 1735      PT LONG TERM GOAL #1   Title pt will be I with advance HEP as of last visit ( 10/30/2015)   Time 6   Period Weeks   Status On-going     PT LONG TERM GOAL #2   Title pt will improve R knee flexion to >/= 90 degrees with </= 3/10 pain to promote functional and efficient gait pattern (10/30/2015)   Time 6   Period Weeks   Status Achieved     PT LONG TERM GOAL #3   Time 6   Period  Weeks   Status Achieved     PT LONG TERM GOAL #4   Title increase FOTO score to >/= 50% limited to demonstate improvement in function (10/30/2015)   Time 6   Period Weeks   Status Unable to assess     PT LONG TERM GOAL #5   Title increase R knee strenght to >/= 4-/5 with </=3/10 pain to promote safety with CKC activities (10/30/2015)   Time 6   Period Weeks   Status Achieved               Plan - 01/08/16 1731    Clinical Impression Statement Continued to work on strengthening on the quads following hamstring stretching and foam rolling. worked on resisted walking withband behind the knee and rocking with the foot slightly externaly rotated to faciliate screw-home mechanism. She is progressing with extension to -6 degrees, Plan to finish out her current visit number addressing remaining goals and push for independent exercise.    PT Frequency 2x / week   PT Duration 2 weeks   PT Treatment/Interventions ADLs/Self Care Home Management;Cryotherapy;Electrical Stimulation;Iontophoresis 4mg /ml Dexamethasone;Moist Heat;Ultrasound;Taping;Patient/family education;Gait training;Stair training;Manual techniques;Passive range of motion;Therapeutic activities;Therapeutic exercise;Balance training;Neuromuscular re-education   PT Next Visit Plan forward/ retro stepping in // using black theraband, try with foot externally rotaed,  working on knee extension with CKC activities. stair stepper, eccentric LE strengthing,   Consulted and Agree with Plan of Care Patient      Patient will benefit from skilled therapeutic intervention in order to improve the following deficits and impairments:  Pain, Improper body mechanics, Postural dysfunction, Increased edema, Decreased strength, Hypomobility, Decreased endurance, Decreased activity tolerance, Decreased balance, Increased fascial restricitons, Decreased range of motion, Abnormal gait  Visit Diagnosis: Stiffness of right knee, not elsewhere classified -  Plan: PT plan of care cert/re-cert  Other abnormalities of gait and mobility - Plan: PT plan of care cert/re-cert  Localized edema - Plan: PT plan of care cert/re-cert  Chronic pain of right knee - Plan: PT plan of care cert/re-cert  Right knee pain, unspecified chronicity - Plan: PT plan of care cert/re-cert     Problem List There are no active problems to display for this patient.  Jaysun Wessels PT, DPT, LAT, ATC  01/08/16  5:43 PM      [facility] [address] [place], [place], [address] Phone: [phone]   Fax:  [phone]  Name: Temple PaciniShguna Nunziata MRN: [id number] Date of Birth: 06-03-1985

## 2016-01-08 NOTE — Therapy (Deleted)
[facility] [address] [place], [place], [address] Phone: [phone]   Fax:  [phone]  Physical Therapy Evaluation  Patient Details  Name: Colleen PaciniShguna Frampton MRN: [id number] Date of Birth: [birth year] Referring Provider: Cammy CopaGregory Scott Dean MD  Encounter Date: 01/08/2016      PT End of Session - 01/08/16 1729    Visit Number 31   Number of Visits 35   Date for PT Re-Evaluation 01/29/16   Authorization Type Self-pay / Medicaid not authorized   PT Start Time 1553  pt arrived 8 minutes late   PT Stop Time 1632   PT Time Calculation (min) 39 min   Activity Tolerance Patient tolerated treatment well   Behavior During Therapy [facility] for tasks assessed/performed      Past Medical History:  Diagnosis Date  . Arthrofibrosis of knee joint    right  . Medical history non-contributory   . Meniscus tear     Past Surgical History:  Procedure Laterality Date  . ANTERIOR CRUCIATE LIGAMENT REPAIR Right 09/03/2015   Procedure: REPAIR ANTERIOR CRUCIATE LIGAMENT  REPAIR OF AVULSION;  Surgeon: Cammy CopaScott Gregory Dean, MD;  [facility]: [facility] OR;  Service: Orthopedics;  Laterality: Right;  . CESAREAN SECTION    . KNEE CLOSED REDUCTION Right 10/08/2015   Procedure: CLOSED MANIPULATION KNEE UNDER ANESTHESIA;  Surgeon: Cammy CopaScott Gregory Dean, MD;  [facility];  Service: Orthopedics;  Laterality: Right;  . MENISCUS REPAIR Right 09/03/2015   Procedure: REPAIR OF MENISCUS LATERAL;  Surgeon: Cammy CopaScott Gregory Dean, MD;  [facility];  Service: Orthopedics;  Laterality: Right;    There were no vitals filed for this visit.       Subjective Assessment - 01/08/16 1558    Subjective "I continue to feel like it is getting a whole lot better, the more I do the more walking i do I think it helps"    Currently in Pain? No/denies            [facility] PT Assessment - 01/08/16 0001      AROM   Right Knee Extension -6     Strength   Left Knee Flexion 5/5   Left Knee Extension 5/5                   [facility] Adult PT Treatment/Exercise - 01/08/16 0001      Knee/Hip Exercises: Stretches   Active Hamstring Stretch 2 reps;30 seconds   Other Knee/Hip Stretches slant board stretch 3 x 30 sec hold     Knee/Hip Exercises: Aerobic   Stepper L4 x 6min     Knee/Hip Exercises: Machines for Strengthening   Cybex Knee Extension 10# up with both down with RLE 3 x 10     Knee/Hip Exercises: Standing   Gait Training resisted gait with black band around posterior knee  x 5  with retro-walking x 10 in //, 1/2 lunging with black band with foot externally rotated slightly 3 x 10 with TKE     Knee/Hip Exercises: Supine   Other Supine Knee/Hip Exercises foam rolling hamstring on the R 3 x 15                PT Education - 01/08/16 1729    Education provided Yes   Education Details when doing forward/ backward walking or rocking rotating the leg/ foot externally slightly to facilitate the rotation mechanism    Person(s) Educated Patient   Methods Explanation;Verbal cues   Comprehension Verbalized understanding;Verbal cues required  PT Short Term Goals - 10/30/15 1313      PT SHORT TERM GOAL #1   Title pt will be I with inital HEP (10/09/2015)   Time 3   Period Weeks   Status Achieved     PT SHORT TERM GOAL #2   Title pt will be able to verbalize and demo techniques to reduce R knee pain and inflammation via RICE and HEP (10/09/2015)   Period Weeks   Status Achieved           PT Long Term Goals - 01/08/16 1735      PT LONG TERM GOAL #1   Title pt will be I with advance HEP as of last visit ( 10/30/2015)   Time 6   Period Weeks   Status On-going     PT LONG TERM GOAL #2   Title pt will improve R knee flexion to >/= 90 degrees with </= 3/10 pain to promote functional and efficient gait pattern (10/30/2015)   Time 6   Period Weeks   Status Achieved     PT LONG TERM GOAL #3   Time 6   Period Weeks   Status  Achieved     PT LONG TERM GOAL #4   Title increase FOTO score to >/= 50% limited to demonstate improvement in function (10/30/2015)   Time 6   Period Weeks   Status Unable to assess     PT LONG TERM GOAL #5   Title increase R knee strenght to >/= 4-/5 with </=3/10 pain to promote safety with CKC activities (10/30/2015)   Time 6   Period Weeks   Status Achieved               Plan - 01/08/16 1731    Clinical Impression Statement Continued to work on strengthening on the quads following hamstring stretching and foam rolling. worked on resisted walking withband behind the knee and rocking with the foot slightly externaly rotated to faciliate screw-home mechanism. She is progressing with extension to -6 degrees, Plan to finish out her current visit number addressing remaining goals and push for independent exercise.    PT Frequency 2x / week   PT Duration 2 weeks   PT Treatment/Interventions ADLs/Self Care Home Management;Cryotherapy;Electrical Stimulation;Iontophoresis 4mg /ml Dexamethasone;Moist Heat;Ultrasound;Taping;Patient/family education;Gait training;Stair training;Manual techniques;Passive range of motion;Therapeutic activities;Therapeutic exercise;Balance training;Neuromuscular re-education   PT Next Visit Plan forward/ retro stepping in // using black theraband, try with foot externally rotaed,  working on knee extension with CKC activities. stair stepper, eccentric LE strengthing,   Consulted and Agree with Plan of Care Patient      Patient will benefit from skilled therapeutic intervention in order to improve the following deficits and impairments:  Pain, Improper body mechanics, Postural dysfunction, Increased edema, Decreased strength, Hypomobility, Decreased endurance, Decreased activity tolerance, Decreased balance, Increased fascial restricitons, Decreased range of motion, Abnormal gait  Visit Diagnosis: Stiffness of right knee, not elsewhere classified - Plan: PT plan of  care cert/re-cert  Other abnormalities of gait and mobility - Plan: PT plan of care cert/re-cert  Localized edema - Plan: PT plan of care cert/re-cert  Chronic pain of right knee - Plan: PT plan of care cert/re-cert  Right knee pain, unspecified chronicity - Plan: PT plan of care cert/re-cert     Problem List There are no active problems to display for this patient.   Lulu Riding 01/08/2016, 5:42 PM  [facility] [address] [place], [place], [address] Phone:  [phone]   Fax:  [phone]  Name: Colleen PaciniShguna Swatzell MRN: [id number] Date of Birth: 02-26-1985

## 2016-01-13 ENCOUNTER — Ambulatory Visit: Payer: Medicaid Other | Admitting: Physical Therapy

## 2016-01-15 ENCOUNTER — Ambulatory Visit: Payer: Medicaid Other | Admitting: Physical Therapy

## 2016-01-15 DIAGNOSIS — R2689 Other abnormalities of gait and mobility: Secondary | ICD-10-CM

## 2016-01-15 DIAGNOSIS — M25661 Stiffness of right knee, not elsewhere classified: Secondary | ICD-10-CM

## 2016-01-15 DIAGNOSIS — M25561 Pain in right knee: Secondary | ICD-10-CM

## 2016-01-15 DIAGNOSIS — R600 Localized edema: Secondary | ICD-10-CM

## 2016-01-15 DIAGNOSIS — G8929 Other chronic pain: Secondary | ICD-10-CM

## 2016-01-15 NOTE — Therapy (Addendum)
Colleen Wells, [place], [address] Phone: [phone]   Fax:  [phone]  Physical Therapy Treatment/ Discharge Note  Patient Details  Name: Colleen Wells MRN: [id number] Date of Birth: [birth year] Referring Provider: Meredith Pel MD  Encounter Date: 01/15/2016      PT End of Session - 01/15/16 1415    Visit Number 32   Number of Visits 35   Date for PT Re-Evaluation 01/29/16   Authorization Type Self-pay / Medicaid not authorized   PT Start Time 65   PT Stop Time 1318   PT Time Calculation (min) 38 min      Past Medical History:  Diagnosis Date  . Arthrofibrosis of knee joint    right  . Medical history non-contributory   . Meniscus tear     Past Surgical History:  Procedure Laterality Date  . ANTERIOR CRUCIATE LIGAMENT REPAIR Right 09/03/2015   Procedure: REPAIR ANTERIOR CRUCIATE LIGAMENT  REPAIR OF AVULSION;  Surgeon: Meredith Pel, MD;  Location: [place];  Service: Orthopedics;  Laterality: Right;  . CESAREAN SECTION    . KNEE CLOSED REDUCTION Right 10/08/2015   Procedure: CLOSED MANIPULATION KNEE UNDER ANESTHESIA;  Surgeon: Meredith Pel, MD;  Location: [place];  Service: Orthopedics;  Laterality: Right;  . MENISCUS REPAIR Right 09/03/2015   Procedure: REPAIR OF MENISCUS LATERAL;  Surgeon: Meredith Pel, MD;  Location: [place];  Service: Orthopedics;  Laterality: Right;    There were no vitals filed for this visit.                       [facility] Adult PT Treatment/Exercise - 01/15/16 0001      Ambulation/Gait   Gait Comments Gait cues to extend at hip as she shifts forward during gait.       Knee/Hip Exercises: Aerobic   Elliptical L6 x 5 min     Knee/Hip Exercises: Machines for Strengthening   Cybex Knee Extension 5# right focusing on slow controlled motion   Cybex Knee Flexion bilateral 25# x 30      Knee/Hip Exercises: Standing   Forward Step Up  Limitations on Bosu with max verbal and tactile cues required to avoid compensations- poor motor control.   Other Standing Knee Exercises Tai Chi stance on right,- forward and side heel striking with left while maintaining weight over RLE    Other Standing Knee Exercises Facing wall weight shifting on onto front right leg and sliding arms up wall to activate glutes.      Knee/Hip Exercises: Supine   Other Supine Knee/Hip Exercises foam rolling hamstring on the R 3 x 15                  PT Short Term Goals - 10/30/15 1313      PT SHORT TERM GOAL #1   Title pt will be I with inital HEP (10/09/2015)   Time 3   Period Weeks   Status Achieved     PT SHORT TERM GOAL #2   Title pt will be able to verbalize and demo techniques to reduce R knee pain and inflammation via RICE and HEP (10/09/2015)   Period Weeks   Status Achieved           PT Long Term Goals - 01/08/16 1735      PT LONG TERM GOAL #1   Title pt will be I with advance HEP as of last visit (  10/30/2015)   Time 6   Period Weeks   Status On-going     PT LONG TERM GOAL #2   Title pt will improve R knee flexion to >/= 90 degrees with </= 3/10 pain to promote functional and efficient gait pattern (10/30/2015)   Time 6   Period Weeks   Status Achieved     PT LONG TERM GOAL #3   Time 6   Period Weeks   Status Achieved     PT LONG TERM GOAL #4   Title increase FOTO score to >/= 50% limited to demonstate improvement in function (10/30/2015)   Time 6   Period Weeks   Status Unable to assess     PT LONG TERM GOAL #5   Title increase R knee strenght to >/= 4-/5 with </=3/10 pain to promote safety with CKC activities (10/30/2015)   Time 6   Period Weeks   Status Achieved               Plan - 01/15/16 1415    Clinical Impression Statement Focused strength and gait mechanics. Pt lacks eccentric quad control. Discussed discharge verses keeping last 3 visits for quad strengthening. Pt would like to continue 3  visits with foucs on quad strength.     PT Next Visit Plan forward/ retro stepping in // using black theraband, try with foot externally rotaed,  working on knee extension with CKC activities. stair stepper, eccentric LE strengthing,      Patient will benefit from skilled therapeutic intervention in order to improve the following deficits and impairments:  Pain, Improper body mechanics, Postural dysfunction, Increased edema, Decreased strength, Hypomobility, Decreased endurance, Decreased activity tolerance, Decreased balance, Increased fascial restricitons, Decreased range of motion, Abnormal gait  Visit Diagnosis: No diagnosis found.     Problem List There are no active problems to display for this patient.   [name] , [place] 01/15/2016, 2:25 PM  [facility] [address] [place], [place], [address] Phone: [phone]   Fax:  [phone]  Name: Colleen Wells MRN: [id number] Date of Birth: 03-Apr-1985    PHYSICAL THERAPY DISCHARGE SUMMARY  Visits from Start of Care: 32  Current functional level related to goals / functional outcomes: See goals   Remaining deficits: Current function unknown due to pt not returning since last attended visit. As of last attended visit she lacked only a few degrees of knee extension.   Education / Equipment: HEP, gait biomechanics, theraband,   Plan: Patient agrees to discharge.  Patient goals were partially met. Patient is being discharged due to not returning since the last visit.  ?????     Kristoffer Leamon PT, DPT, LAT, ATC  02/13/16  3:38 PM

## 2016-01-23 ENCOUNTER — Ambulatory Visit: Payer: Medicaid Other | Admitting: Physical Therapy

## 2016-01-29 ENCOUNTER — Ambulatory Visit: Payer: Medicaid Other | Attending: Orthopedic Surgery | Admitting: Physical Therapy

## 2016-01-30 ENCOUNTER — Ambulatory Visit: Payer: Medicaid Other | Admitting: Physical Therapy

## 2016-06-03 ENCOUNTER — Encounter (INDEPENDENT_AMBULATORY_CARE_PROVIDER_SITE_OTHER): Payer: Self-pay

## 2016-06-03 ENCOUNTER — Encounter (INDEPENDENT_AMBULATORY_CARE_PROVIDER_SITE_OTHER): Payer: Self-pay | Admitting: Orthopedic Surgery

## 2016-06-03 ENCOUNTER — Ambulatory Visit (INDEPENDENT_AMBULATORY_CARE_PROVIDER_SITE_OTHER): Payer: Medicaid Other | Admitting: Orthopedic Surgery

## 2016-06-03 DIAGNOSIS — M25561 Pain in right knee: Secondary | ICD-10-CM | POA: Diagnosis not present

## 2016-06-03 NOTE — Progress Notes (Signed)
Office Visit Note   Patient: Colleen Wells           Date of Birth: September 25, 1985           MRN: [id number] Visit Date: 06/03/2016 Requested by: No referring provider defined for this encounter. PCP: No PCP Per Patient  Subjective: Chief Complaint  Patient presents with  . Right Knee - Follow-up    HPI: She again is a 31 year old female with right knee anterior cruciate ligament avulsion fracture fixation.  She's here for 6 month recheck.  In general she's doing well the problems that she is a little concerned about her inability to lock out the right knee.  She's having no symptomatic instability.              ROS: All systems reviewed are negative as they relate to the chief complaint within the history of present illness.  Patient denies  fevers or chills.   Assessment & Plan: Visit Diagnoses:  1. Right knee pain, unspecified chronicity     Plan: Impression is stable right knee with loss of about 7-8 of full extension.  Her left knee does hyperextend about 45.  Her gait is normal.  I think if she really wanted to try to get that leg straight.  Would take about 1-1/2-2 hours per day for 6-8 weeks.  I don't think it's impossible but I think it would take a lot of work on her part.  Her repair t is stable there is no effusion.  I will see her back as needed  Follow-Up Instructions: Return if symptoms worsen or fail to improve.   Orders:  No orders of the defined types were placed in this encounter.  No orders of the defined types were placed in this encounter.     Procedures: No procedures performed   Clinical Data: No additional findings.  Objective: Vital Signs: There were no vitals taken for this visit.  Physical Exam:   Constitutional: Patient appears well-developed HEENT:  Head: Normocephalic Eyes:EOM are normal Neck: Normal range of motion Cardiovascular: Normal rate Pulmonary/chest: Effort normal Neurologic: Patient is alert Skin: Skin is  warm Psychiatric: Patient has normal mood and affect    Ortho Exam: Orthopedic exam demonstrates full active and passive range of motion she's lacking about 8 of full extension on the right but there is a bouncing endpoint.  Collaterals are stable cruciates are stable she has good patellar mobility.  Excellent quad and hamstring strength.  No other masses lymph adenopathy or skin changes or temperature difference between the right knee and left knee is present.  Specialty Comments:  No specialty comments available.  Imaging: No results found.   PMFS History: There are no active problems to display for this patient.  Past Medical History:  Diagnosis Date  . Arthrofibrosis of knee joint    right  . Medical history non-contributory   . Meniscus tear     No family history on file.  Past Surgical History:  Procedure Laterality Date  . ANTERIOR CRUCIATE LIGAMENT REPAIR Right 09/03/2015   Procedure: REPAIR ANTERIOR CRUCIATE LIGAMENT  REPAIR OF AVULSION;  Surgeon: Cammy Copa, MD;  [facility];  Service: Orthopedics;  Laterality: Right;  . CESAREAN SECTION    . KNEE CLOSED REDUCTION Right 10/08/2015   Procedure: CLOSED MANIPULATION KNEE UNDER ANESTHESIA;  Surgeon: Cammy Copa, MD;  Location: [facility];  Service: Orthopedics;  Laterality: Right;  . MENISCUS REPAIR Right 09/03/2015   Procedure: REPAIR OF MENISCUS  LATERAL;  Surgeon: Cammy Copa, MD;  Location: [facility] OR;  Service: Orthopedics;  Laterality: Right;   Social History   Occupational History  . Not on file.   Social History Main Topics  . Smoking status: Never Smoker  . Smokeless tobacco: Never Used  . Alcohol use No  . Drug use: No  . Sexual activity: Yes    Birth control/ protection: Injection

## 2016-10-14 ENCOUNTER — Ambulatory Visit (INDEPENDENT_AMBULATORY_CARE_PROVIDER_SITE_OTHER): Payer: Medicaid Other | Admitting: Orthopedic Surgery

## 2017-09-23 DIAGNOSIS — Z202 Contact with and (suspected) exposure to infections with a predominantly sexual mode of transmission: Secondary | ICD-10-CM | POA: Diagnosis not present

## 2017-11-02 ENCOUNTER — Emergency Department (HOSPITAL_COMMUNITY)
Admission: EM | Admit: 2017-11-02 | Discharge: 2017-11-03 | Discharge status: Against Medical Advice | Payer: Medicaid Other | Attending: Emergency Medicine | Admitting: Emergency Medicine

## 2017-11-02 ENCOUNTER — Encounter (HOSPITAL_COMMUNITY): Payer: Self-pay | Admitting: Emergency Medicine

## 2017-11-02 ENCOUNTER — Other Ambulatory Visit: Payer: Self-pay

## 2017-11-02 DIAGNOSIS — Z5321 Procedure and treatment not carried out due to patient leaving prior to being seen by health care provider: Secondary | ICD-10-CM | POA: Diagnosis not present

## 2017-11-02 DIAGNOSIS — R21 Rash and other nonspecific skin eruption: Secondary | ICD-10-CM | POA: Diagnosis not present

## 2017-11-02 NOTE — ED Triage Notes (Addendum)
Pt reports itching and a rash all over torso that has been going on for the last few days. Pt denies pain. Pt reports she thinks it was some type of dust from work, denies any hygiene product changes. Denies sob.

## 2017-11-03 ENCOUNTER — Ambulatory Visit (HOSPITAL_COMMUNITY)
Admission: EM | Admit: 2017-11-03 | Discharge: 2017-11-03 | Disposition: A | Discharge status: Home / Self Care | Payer: Medicaid Other | Attending: Family Medicine | Admitting: Family Medicine

## 2017-11-03 ENCOUNTER — Other Ambulatory Visit: Payer: Self-pay

## 2017-11-03 ENCOUNTER — Encounter (HOSPITAL_COMMUNITY): Payer: Self-pay

## 2017-11-03 DIAGNOSIS — L509 Urticaria, unspecified: Secondary | ICD-10-CM

## 2017-11-03 MED ORDER — PREDNISONE 10 MG (21) PO TBPK: ORAL_TABLET | Freq: Every day | ORAL | 0 refills | Status: DC

## 2017-11-03 NOTE — ED Notes (Signed)
Pt wants to leave due to wait time. Pt encouraged to stay. Pt refuses and turns in labels. Pt seen leaving lobby out front door.

## 2017-11-03 NOTE — Medical Student Note (Signed)
[facility] Insurance account manager Note For educational purposes for Medical, PA and NP students only and not part of the legal medical record.   CSN: [id number] Arrival date & time: 11/03/17  1647     History   Chief Complaint Chief Complaint  Patient presents with  . Rash    HPI Colleen Wells is a 32 y.o. female presents today with complaints of a rash.  Rash: Patient complains of rash involving the generalized. Rash started 1 week ago. Appearance of rash at onset: Color of lesion(s): redness, Texture of lesion(s): raised. Rash has not changed over time Initial distribution: same as current.  Discomfort associated with rash: is pruritic.  Associated symptoms: none. Denies: abdominal pain and headache. Patient has not had previous evaluation of rash. Patient has had previous treatment.  Response to treatment: did not improve with Hydrocortisone cream. Patient has not had contacts with similar rash. Patient has not identified precipitant. Patient has had new exposures (soaps, lotions, laundry detergents, foods, medications, plants, insects or animals.) Changed detergent to be gain with oxy-clean.   HPI  Past Medical History:  Diagnosis Date  . Arthrofibrosis of knee joint    right  . Medical history non-contributory   . Meniscus tear     There are no active problems to display for this patient.   Past Surgical History:  Procedure Laterality Date  . ANTERIOR CRUCIATE LIGAMENT REPAIR Right 09/03/2015   Procedure: REPAIR ANTERIOR CRUCIATE LIGAMENT  REPAIR OF AVULSION;  Surgeon: Cammy Copa, MD;  Location: [facility] OR;  Service: Orthopedics;  Laterality: Right;  . CESAREAN SECTION    . KNEE CLOSED REDUCTION Right 10/08/2015   Procedure: CLOSED MANIPULATION KNEE UNDER ANESTHESIA;  Surgeon: Cammy Copa, MD;  Location: [facility] OR;  Service: Orthopedics;  Laterality: Right;  . MENISCUS REPAIR Right 09/03/2015   Procedure: REPAIR OF MENISCUS LATERAL;  Surgeon: Cammy Copa, MD;  Location: [facility] OR;  Service: Orthopedics;  Laterality: Right;    OB History   None      Home Medications    Prior to Admission medications   Medication Sig Start Date End Date Taking? Authorizing Provider  naproxen sodium (ANAPROX) 220 MG tablet Take 220 mg by mouth 2 (two) times daily with a meal.    [provider]  oxyCODONE (OXY IR/ROXICODONE) 5 MG immediate release tablet Take 1 tablet (5 mg total) by mouth every 6 (six) hours as needed for moderate pain. 11/17/15   Earley Favor, NP  predniSONE (STERAPRED UNI-PAK 21 TAB) 10 MG (21) TBPK tablet Take by mouth daily. Take as directed. 11/03/17   Mardella Layman, MD    Family History History reviewed. No pertinent family history.  Social History Social History   Tobacco Use  . Smoking status: Never Smoker  . Smokeless tobacco: Never Used  Substance Use Topics  . Alcohol use: No  . Drug use: No     Allergies   No known allergies   Review of Systems Review of Systems  Skin: Positive for color change and rash.       Rash present on right/left side extending from under arms through hips, under breasts, and on lower back.      Physical Exam Updated Vital Signs BP 131/76   Pulse 69   Temp 97.8 F (36.6 C) (Tympanic)   Resp 18   Wt 87.1 kg   LMP 11/02/2017   SpO2 100%   BMI 29.19 kg/m   Physical Exam  Constitutional:  She appears well-developed and well-nourished.  Skin: Skin is warm and dry. Rash noted. There is erythema.  Raised, hot rash present bilaterally under arms and past hips, under breasts, and lower back.   Nursing note and vitals reviewed.    ED Treatments / Results  Labs (all labs ordered are listed, but only abnormal results are displayed) Labs Reviewed - No data to display  EKG None Radiology No results found.  Procedures Procedures (including critical care time)  Medications Ordered in ED Medications - No data to display   Initial Impression / Assessment and Plan  / ED Course  I have reviewed the triage vital signs and the nursing notes.  Pertinent labs & imaging results that were available during my care of the patient were reviewed by me and considered in my medical decision making (see chart for details).    Hives Prescribed steroid taper  Encouraged to watch for triggers   Final Clinical Impressions(s) / ED Diagnoses   Final diagnoses:  Hives    New Prescriptions New Prescriptions   PREDNISONE (STERAPRED UNI-PAK 21 TAB) 10 MG (21) TBPK TABLET    Take by mouth daily. Take as directed.

## 2017-11-30 ENCOUNTER — Other Ambulatory Visit: Payer: Self-pay

## 2017-11-30 ENCOUNTER — Encounter (HOSPITAL_COMMUNITY): Payer: Self-pay

## 2017-11-30 ENCOUNTER — Ambulatory Visit (HOSPITAL_COMMUNITY)
Admission: EM | Admit: 2017-11-30 | Discharge: 2017-11-30 | Disposition: A | Discharge status: Home / Self Care | Payer: Medicaid Other | Attending: Family Medicine | Admitting: Family Medicine

## 2017-11-30 DIAGNOSIS — N898 Other specified noninflammatory disorders of vagina: Secondary | ICD-10-CM | POA: Diagnosis not present

## 2017-11-30 LAB — POCT URINALYSIS DIP (DEVICE)
Bilirubin Urine: NEGATIVE
GLUCOSE, UA: NEGATIVE mg/dL
Ketones, ur: NEGATIVE mg/dL
NITRITE: NEGATIVE
Protein, ur: NEGATIVE mg/dL
Specific Gravity, Urine: 1.01 (ref 1.005–1.030)
Urobilinogen, UA: 0.2 mg/dL (ref 0.0–1.0)
pH: 6 (ref 5.0–8.0)

## 2017-11-30 MED ORDER — FLUCONAZOLE 150 MG PO TABS: 150.0000 mg | ORAL_TABLET | Freq: Every day | ORAL | 0 refills | Status: DC

## 2017-11-30 NOTE — ED Triage Notes (Signed)
Pt state she has a vaginal discharge x 4 days

## 2017-11-30 NOTE — Discharge Instructions (Signed)
Your urine was inconsistent with an urinary tract infection, I am sending for culture to make sure. Start diflucan for possible yeast infection. Cytology sent, you will be contacted with any positive results that requires further treatment. Refrain from sexual activity and alcohol use for the next 7 days. Monitor for any worsening of symptoms, fever, abdominal pain, nausea, vomiting, to follow up for reevaluation.

## 2017-11-30 NOTE — ED Provider Notes (Signed)
[facility]    CSN: [id number] Arrival date & time: 11/30/17  1649     History   Chief Complaint Chief Complaint  Patient presents with  . Vaginal Discharge    HPI Colleen Wells is a 32 y.o. female.   32 year old female comes in for 4 day history of vaginal discharge. Describes discharge as milky and thick. Denies odor, vaginal itching, pain. Urinary frequency without dysuria or hematuria. Denies abdominal pain, nausea, vomiting. Denies fever, chills, night sweats. LMP 11/23/2017. Sexually active with one new partner without condom use. Had recent changes in body soap.      Past Medical History:  Diagnosis Date  . Arthrofibrosis of knee joint    right  . Medical history non-contributory   . Meniscus tear     There are no active problems to display for this patient.   Past Surgical History:  Procedure Laterality Date  . ANTERIOR CRUCIATE LIGAMENT REPAIR Right 09/03/2015   Procedure: REPAIR ANTERIOR CRUCIATE LIGAMENT  REPAIR OF AVULSION;  Surgeon: Cammy Copa, MD;  Location: [facility] OR;  Service: Orthopedics;  Laterality: Right;  . CESAREAN SECTION    . KNEE CLOSED REDUCTION Right 10/08/2015   Procedure: CLOSED MANIPULATION KNEE UNDER ANESTHESIA;  Surgeon: Cammy Copa, MD;  Location: [facility] OR;  Service: Orthopedics;  Laterality: Right;  . MENISCUS REPAIR Right 09/03/2015   Procedure: REPAIR OF MENISCUS LATERAL;  Surgeon: Cammy Copa, MD;  Location: [facility] OR;  Service: Orthopedics;  Laterality: Right;    OB History   None      Home Medications    Prior to Admission medications   Medication Sig Start Date End Date Taking? Authorizing Provider  fluconazole (DIFLUCAN) 150 MG tablet Take 1 tablet (150 mg total) by mouth daily. Take second dose 72 hours later if symptoms still persists. 11/30/17   Cathie Hoops, Meigan Pates V, PA-C  naproxen sodium (ANAPROX) 220 MG tablet Take 220 mg by mouth 2 (two) times daily with a meal.    [provider]  oxyCODONE  (OXY IR/ROXICODONE) 5 MG immediate release tablet Take 1 tablet (5 mg total) by mouth every 6 (six) hours as needed for moderate pain. 11/17/15   Earley Favor, NP  predniSONE (STERAPRED UNI-PAK 21 TAB) 10 MG (21) TBPK tablet Take by mouth daily. Take as directed. 11/03/17   Mardella Layman, MD    Family History History reviewed. No pertinent family history.  Social History Social History   Tobacco Use  . Smoking status: Never Smoker  . Smokeless tobacco: Never Used  Substance Use Topics  . Alcohol use: No  . Drug use: No     Allergies   No known allergies   Review of Systems Review of Systems  Reason unable to perform ROS: See HPI as above.     Physical Exam Triage Vital Signs ED Triage Vitals  Enc Vitals Group     BP 11/30/17 1805 (!) 137/91     Pulse Rate 11/30/17 1805 70     Resp 11/30/17 1805 18     Temp 11/30/17 1805 97.9 F (36.6 C)     Temp Source 11/30/17 1805 Oral     SpO2 11/30/17 1805 100 %     Weight 11/30/17 1806 196 lb (88.9 kg)     Height --      Head Circumference --      Peak Flow --      Pain Score 11/30/17 1806 6     Pain Loc --  Pain Edu? --      Excl. in GC? --    No data found.  Updated Vital Signs BP (!) 137/91 (BP Location: Left Arm)   Pulse 70   Temp 97.9 F (36.6 C) (Oral)   Resp 18   Wt 196 lb (88.9 kg)   LMP 11/02/2017   SpO2 100%   BMI 29.80 kg/m   Visual Acuity Right Eye Distance:   Left Eye Distance:   Bilateral Distance:    Right Eye Near:   Left Eye Near:    Bilateral Near:     Physical Exam  Constitutional: She is oriented to person, place, and time. She appears well-developed and well-nourished. No distress.  HENT:  Head: Normocephalic and atraumatic.  Eyes: Pupils are equal, round, and reactive to light. Conjunctivae are normal.  Cardiovascular: Normal rate, regular rhythm and normal heart sounds. Exam reveals no gallop and no friction rub.  No murmur heard. Pulmonary/Chest: Effort normal and breath  sounds normal. She has no wheezes. She has no rales.  Abdominal: Soft. Bowel sounds are normal. She exhibits no mass. There is no tenderness. There is no rebound, no guarding and no CVA tenderness.  Neurological: She is alert and oriented to person, place, and time.  Skin: Skin is warm and dry.  Psychiatric: She has a normal mood and affect. Her behavior is normal. Judgment normal.     UC Treatments / Results  Labs (all labs ordered are listed, but only abnormal results are displayed) Labs Reviewed  POCT URINALYSIS DIP (DEVICE) - Abnormal; Notable for the following components:      Result Value   Hgb urine dipstick TRACE (*)    Leukocytes, UA TRACE (*)    All other components within normal limits  URINE CULTURE  CERVICOVAGINAL ANCILLARY ONLY    EKG None  Radiology No results found.  Procedures Procedures (including critical care time)  Medications Ordered in UC Medications - No data to display  Initial Impression / Assessment and Plan / UC Course  I have reviewed the triage vital signs and the nursing notes.  Pertinent labs & imaging results that were available during my care of the patient were reviewed by me and considered in my medical decision making (see chart for details).    Urine with trace leuks, will send for urine culture prior treatment. Will provide treatment for yeast given new changes in hygiene product. Diflucan as directed. Cytology sent, patient will be contacted with any positive results that require additional treatment. Patient to refrain from sexual activity for the next 7 days. Return precautions given.   Final Clinical Impressions(s) / UC Diagnoses   Final diagnoses:  Vaginal discharge    ED Prescriptions    Medication Sig Dispense Auth. Provider   fluconazole (DIFLUCAN) 150 MG tablet Take 1 tablet (150 mg total) by mouth daily. Take second dose 72 hours later if symptoms still persists. 2 tablet Threasa Alpha,  PA-C 11/30/17 1845

## 2017-12-01 LAB — CERVICOVAGINAL ANCILLARY ONLY
Chlamydia: NEGATIVE
Neisseria Gonorrhea: NEGATIVE
Trichomonas: NEGATIVE

## 2017-12-02 LAB — URINE CULTURE

## 2018-03-07 ENCOUNTER — Other Ambulatory Visit (HOSPITAL_COMMUNITY): Payer: Self-pay | Admitting: Family

## 2018-03-08 ENCOUNTER — Ambulatory Visit
Admission: EM | Admit: 2018-03-08 | Discharge: 2018-03-08 | Disposition: A | Discharge status: Home / Self Care | Payer: Medicaid Other | Attending: Physician Assistant | Admitting: Physician Assistant

## 2018-03-08 ENCOUNTER — Encounter: Payer: Self-pay | Admitting: Emergency Medicine

## 2018-03-08 DIAGNOSIS — N898 Other specified noninflammatory disorders of vagina: Secondary | ICD-10-CM | POA: Insufficient documentation

## 2018-03-08 LAB — POCT URINALYSIS DIP (MANUAL ENTRY)
Bilirubin, UA: NEGATIVE
Glucose, UA: NEGATIVE mg/dL
Ketones, POC UA: NEGATIVE mg/dL
NITRITE UA: NEGATIVE
PH UA: 5.5 (ref 5.0–8.0)
Spec Grav, UA: >=1.03 — AB (ref 1.010–1.025)
UROBILINOGEN UA: 0.2 U/dL

## 2018-03-08 LAB — POCT URINE PREGNANCY: PREG TEST UR: NEGATIVE

## 2018-03-08 MED ORDER — FLUCONAZOLE 150 MG PO TABS: 150.0000 mg | ORAL_TABLET | Freq: Once | ORAL | 0 refills | Status: AC

## 2018-03-08 NOTE — ED Triage Notes (Signed)
Pt presents to [facility] for assessment of rash, itchiness and irritation x 4 days.  States the rash developed after a waxing in December.  Denies discharge or abdominal pain.

## 2018-03-08 NOTE — ED Provider Notes (Signed)
03/08/2018 5:23 PM   DOB: 01-15-1986 / MRN: [id number]  SUBJECTIVE:  Colleen Wells is a 33 y.o. female presenting for vaginal itching.  This started about 3 days ago and is worsening.  She denies discharge.  She denies abnormal odor.  She denies dysuria.  She has had yeast infections in the past that did not have discharge and she feels that is the cause of her symptoms today.  She would like to be checked for gonorrhea, chlamydia, yeast, bacterial vaginosis, trichomonas.  She is allergic to no known allergies.   She  has a past medical history of Arthrofibrosis of knee joint, Medical history non-contributory, and Meniscus tear.    She  reports that she has never smoked. She has never used smokeless tobacco. She reports that she does not drink alcohol or use drugs. She  reports being sexually active. She reports using the following method of birth control/protection: Injection. The patient  has a past surgical history that includes Cesarean section; Anterior cruciate ligament repair (Right, 09/03/2015); Meniscus repair (Right, 09/03/2015); and Knee Closed Reduction (Right, 10/08/2015).  Her family history is not on file.  Review of Systems  Constitutional: Negative for diaphoresis.  Eyes: Negative.   Respiratory: Negative for shortness of breath.   Cardiovascular: Negative for chest pain, orthopnea and leg swelling.  Gastrointestinal: Negative for abdominal pain, blood in stool, constipation, diarrhea, heartburn, melena, nausea and vomiting.  Genitourinary: Negative for dysuria, flank pain, frequency, hematuria and urgency.  Neurological: Negative for dizziness, sensory change, speech change, focal weakness and headaches.    OBJECTIVE:  BP 138/84 (BP Location: Left Arm)   Pulse 70   Temp 97.7 F (36.5 C) (Oral)   Resp 18   LMP 02/07/2018   SpO2 98%   Wt Readings from Last 3 Encounters:  11/30/17 196 lb (88.9 kg)  11/03/17 192 lb (87.1 kg)  11/02/17 192 lb (87.1 kg)   Temp Readings  from Last 3 Encounters:  03/08/18 97.7 F (36.5 C) (Oral)  11/30/17 97.9 F (36.6 C) (Oral)  11/03/17 97.8 F (36.6 C) (Tympanic)   BP Readings from Last 3 Encounters:  03/08/18 138/84  11/30/17 (!) 137/91  11/03/17 131/76   Pulse Readings from Last 3 Encounters:  03/08/18 70  11/30/17 70  11/03/17 69    Physical Exam Vitals signs reviewed. Exam conducted with a chaperone present.  Constitutional:      General: She is not in acute distress.    Appearance: She is not diaphoretic.  Eyes:     Pupils: Pupils are equal, round, and reactive to light.  Cardiovascular:     Rate and Rhythm: Normal rate and regular rhythm.     Pulses: No decreased pulses.     Heart sounds: Normal heart sounds, S1 normal and S2 normal. No murmur. No friction rub. No gallop.   Pulmonary:     Effort: Pulmonary effort is normal. No respiratory distress.     Breath sounds: No stridor. No wheezing or rales.  Abdominal:     General: There is no distension.     Hernia: There is no hernia in the right inguinal area or left inguinal area.  Genitourinary:    Pubic Area: No rash or pubic lice.      Labia:        Right: No rash or injury.        Left: No rash or injury.     Lymphadenopathy:     Lower Body: No right inguinal adenopathy. No left  inguinal adenopathy.  Skin:    General: Skin is dry.  Neurological:     Mental Status: She is alert and oriented to person, place, and time.     Cranial Nerves: No cranial nerve deficit.     Gait: Gait normal.    No results found for: ALT, AST, GGT, ALKPHOS, BILITOT  No results found for this or any previous visit (from the past 72 hour(s)).  No results found.  ASSESSMENT AND PLAN:   Vaginal itching: Patient with vaginal itching x4 days and worsening.  She has a history of yeast infection per her report.  I will screen for STI, yeast, BV.  We will contact her with her results and treat accordingly.    Discharge Instructions     Okay to start the  Diflucan today.  We will contact you with your results.        The patient is advised to call or return to clinic if she does not see an improvement in symptoms, or to seek the care of the closest emergency department if she worsens with the above plan.   Deliah Boston, MHS, PA-C 03/08/2018 5:23 PM   Ofilia Neas, PA-C 03/08/18 1723

## 2018-03-08 NOTE — Discharge Instructions (Addendum)
Okay to start the Diflucan today.  We will contact you with your results.

## 2018-03-08 NOTE — ED Notes (Signed)
Patient able to ambulate independently  

## 2018-03-10 ENCOUNTER — Encounter (HOSPITAL_COMMUNITY): Payer: Self-pay

## 2018-03-10 LAB — CERVICOVAGINAL ANCILLARY ONLY
Bacterial vaginitis: NEGATIVE
Candida vaginitis: POSITIVE — AB
Chlamydia: NEGATIVE
NEISSERIA GONORRHEA: NEGATIVE
Trichomonas: NEGATIVE

## 2018-03-10 LAB — URINE CULTURE: Culture: 10000 — AB

## 2018-03-14 ENCOUNTER — Encounter (HOSPITAL_COMMUNITY): Payer: Self-pay

## 2018-03-31 ENCOUNTER — Telehealth (HOSPITAL_COMMUNITY): Payer: Self-pay | Admitting: Emergency Medicine

## 2018-03-31 MED ORDER — FLUCONAZOLE 150 MG PO TABS: 150.0000 mg | ORAL_TABLET | Freq: Once | ORAL | 0 refills | Status: AC

## 2018-03-31 NOTE — Telephone Encounter (Signed)
Pt called stating she was still having discharge, states she took the diflucan incorrectly, took both pills toegether. Will retry diflucan correctly and patient will return if still having discharge afterward. Agreeable to plan.

## 2018-04-21 IMAGING — DX DG KNEE COMPLETE 4+V*R*
4 series · 4 of 4 positions shown · non-contrast
Comparison: None.

CLINICAL DATA: 30-year-old female with fall and right knee pain.

EXAM:
RIGHT KNEE - COMPLETE 4+ VIEW

[knee ap]
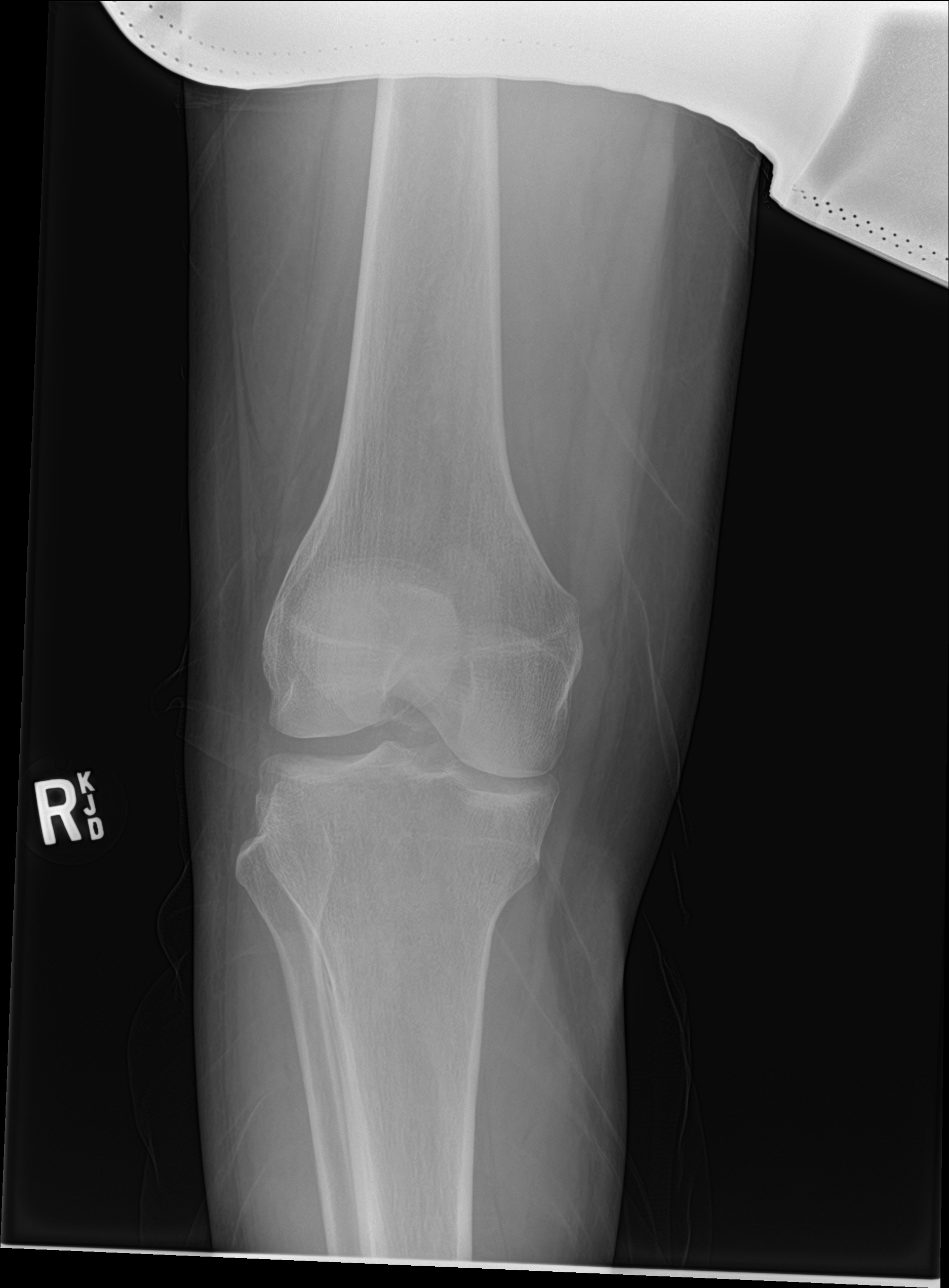

[knee obl (1 of 2)]
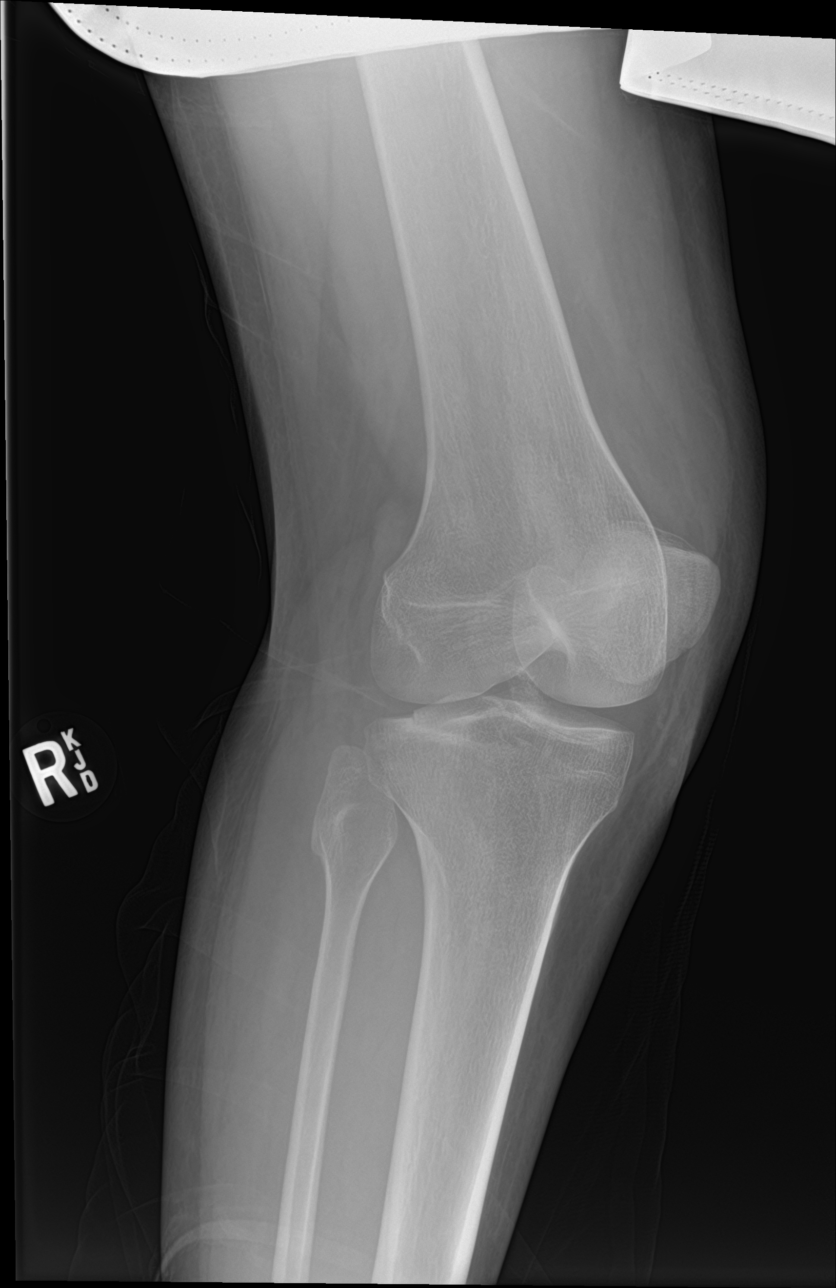

[knee obl (2 of 2)]
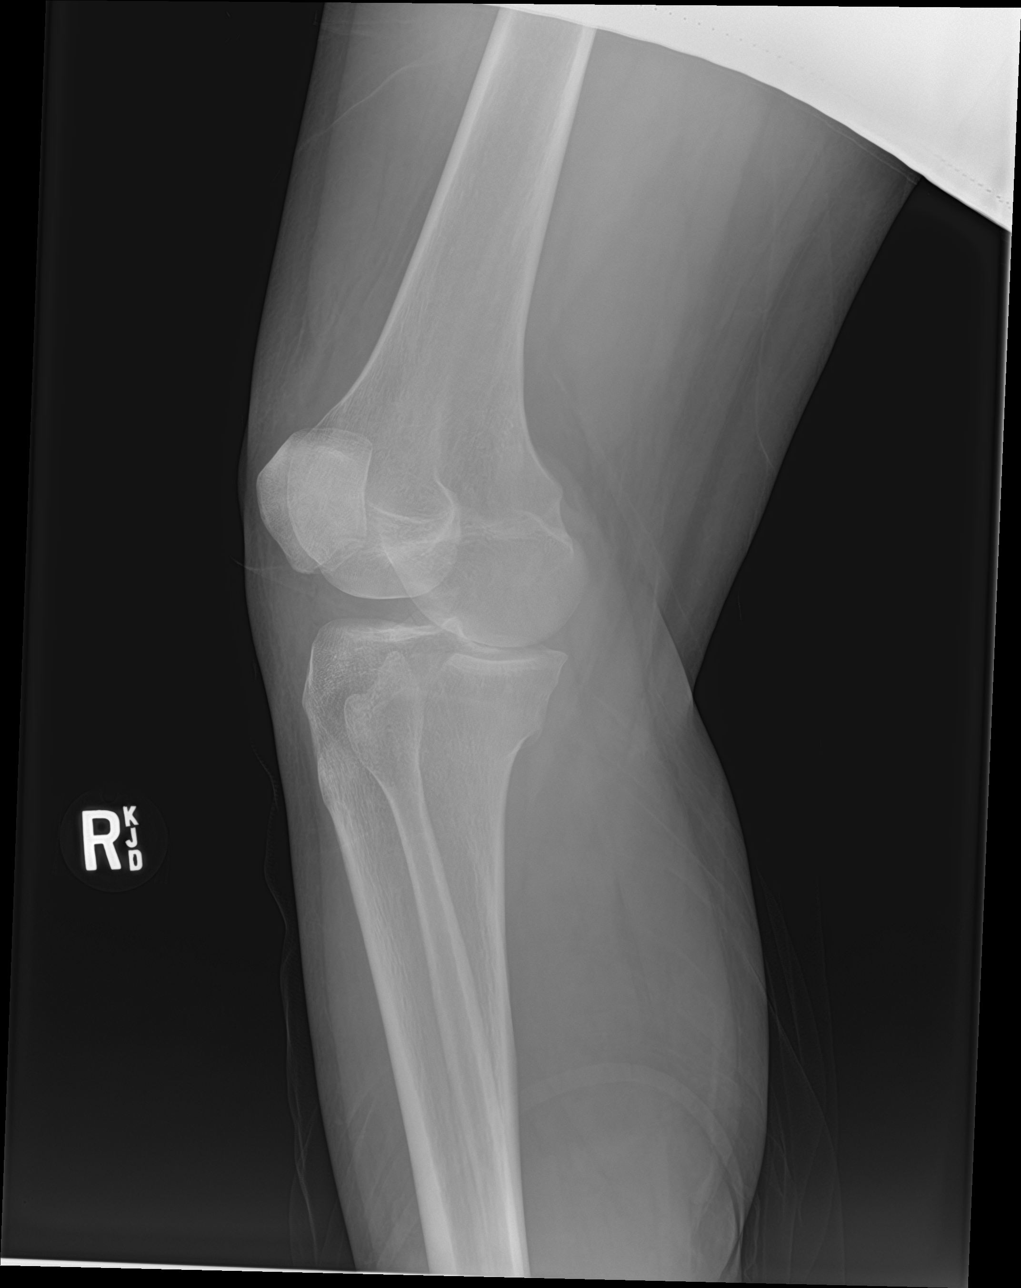

[knee lat]
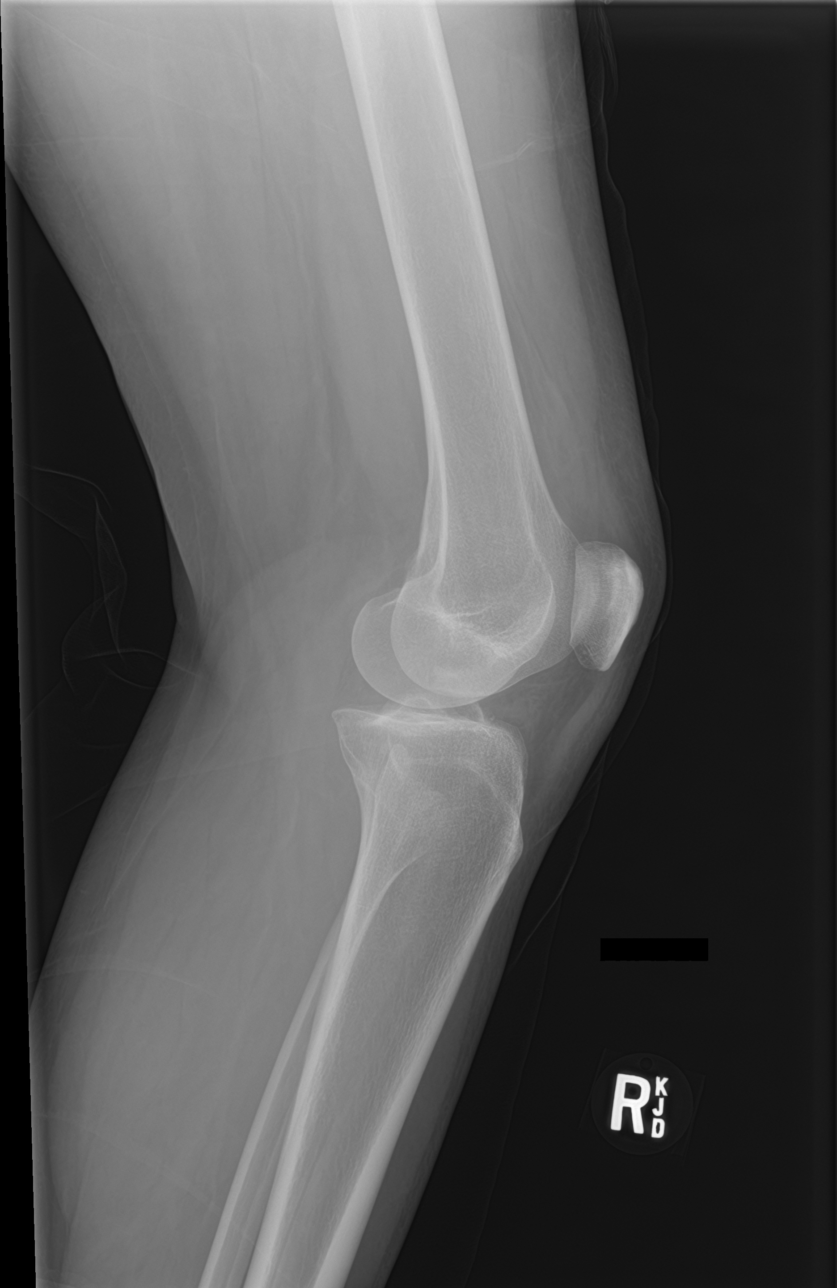

[4 of 4 positions shown; findings below may reference images not displayed]

FINDINGS: No evidence of fracture, dislocation, or joint effusion. No evidence
of arthropathy or other focal bone abnormality. Soft tissues are
unremarkable.
IMPRESSION: Negative.

## 2018-05-31 DIAGNOSIS — Z3201 Encounter for pregnancy test, result positive: Secondary | ICD-10-CM | POA: Diagnosis not present

## 2018-06-21 ENCOUNTER — Telehealth: Payer: Medicaid Other | Admitting: Physician Assistant

## 2018-06-21 DIAGNOSIS — R110 Nausea: Secondary | ICD-10-CM | POA: Diagnosis not present

## 2018-06-21 MED ORDER — DOXYLAMINE-PYRIDOXINE 10-10 MG PO TBEC: DELAYED_RELEASE_TABLET | ORAL | 0 refills | Status: DC

## 2018-06-21 NOTE — Progress Notes (Signed)
We are sorry that you are not feeling well. Here is how we plan to help!  Based on what you have shared with me it looks like you have a Virus that is irritating your GI tract.  Vomiting is the forceful emptying of a portion of the stomach's content through the mouth.  Although nausea and vomiting can make you feel miserable, it's important to remember that these are not diseases, but rather symptoms of an underlying illness.  When we treat short term symptoms, we always caution that any symptoms that persist should be fully evaluated in a medical office.  I have prescribed a medication that will help alleviate your symptoms and allow you to stay hydrated:  Diclegis.  You may take two tabs daily for the treatment and prevention of pregnancy related nausea and vomiting.   HOME CARE: Drink clear liquids.  This is very important! Dehydration (the lack of fluid) can lead to a serious complication.  Start off with 1 tablespoon every 5 minutes for 8 hours. You may begin eating bland foods after 8 hours without vomiting.  Start with saltine crackers, white bread, rice, mashed potatoes, applesauce. After 48 hours on a bland diet, you may resume a normal diet. Try to go to sleep.  Sleep often empties the stomach and relieves the need to vomit.  GET HELP RIGHT AWAY IF:  Your symptoms do not improve or worsen within 2 days after treatment. You have a fever for over 3 days. You cannot keep down fluids after trying the medication.  MAKE SURE YOU:  Understand these instructions. Will watch your condition. Will get help right away if you are not doing well or get worse.   Thank you for choosing an e-visit. Your e-visit answers were reviewed by a board certified advanced clinical practitioner to complete your personal care plan. Depending upon the condition, your plan could have included both over the counter or prescription medications. Please review your pharmacy choice. Be sure that the pharmacy you  have chosen is open so that you can pick up your prescription now.  If there is a problem you may message your provider in MyChart to have the prescription routed to another pharmacy. Your safety is important to us. If you have drug allergies check your prescription carefully.  For the next 24 hours, you can use MyChart to ask questions about today's visit, request a non-urgent call back, or ask for a work or school excuse from your e-visit provider. You will get an e-mail in the next two days asking about your experience. I hope that your e-visit has been valuable and will speed your recovery.    ===View-only below this line===   ----- Message -----    From: [name]    Sent: 06/21/2018  2:25 PM EDT      To: E-Visit Mailing List Subject: E-Visit Submission: Nausea & Vomiting  E-Visit Submission: Nausea & Vomiting --------------------------------  Question: What is your primary symptom? Answer:   Nausea  Question: Have you experienced fever or chills with your symptoms? Answer:   No  Question: How long have you been vomiting? Answer:   More than 3 days  Question: How often have you been vomiting? Answer:   1-5 times a day  Question: Is there a red or maroon color or the appearance of coffee grounds in the vomit? Answer:   No  Question: Do you have any of the following symptoms? (check all that apply) Answer:   Migraine  Question:  Other symptoms or additional comments: Answer:   It's not vomiting its the urge to vomit due to pregnancy.  Question: Are you taking any of the following medications? Answer:     Question: Have you tried any over the counter medications?  Answer:   No  Question: If Yes, please indicate which of the following over the counter medications you have tried (Check all that apply) Answer:     Question: Other over the counter medications you have tried or comments: Answer:     Question: Were any of the medications effective? Answer:    No  Question: Is your mouth dry? Answer:   No  Question: Have you urinated? Answer:   Yes  Question: Was your urine dark? Answer:   No  Question: Are you able to keep down liquids? Answer:   Yes  Question: Please list your medication allergies that you may have ? (If 'none' , please list as 'none') Answer:   None  Question: Are you pregnant? Answer:   I am pregnant  Question: Are you breastfeeding? Answer:   No  Question: Please list any additional comments  Answer:     A total of 5-10 minutes was spent evaluating this patients questionnaire and formulating a plan of care.

## 2018-07-14 DIAGNOSIS — Z3009 Encounter for other general counseling and advice on contraception: Secondary | ICD-10-CM | POA: Diagnosis not present

## 2018-07-14 DIAGNOSIS — E739 Lactose intolerance, unspecified: Secondary | ICD-10-CM | POA: Diagnosis not present

## 2018-07-14 DIAGNOSIS — Z3481 Encounter for supervision of other normal pregnancy, first trimester: Secondary | ICD-10-CM | POA: Diagnosis not present

## 2018-07-14 DIAGNOSIS — O09519 Supervision of elderly primigravida, unspecified trimester: Secondary | ICD-10-CM | POA: Diagnosis not present

## 2018-07-14 DIAGNOSIS — Z1388 Encounter for screening for disorder due to exposure to contaminants: Secondary | ICD-10-CM | POA: Diagnosis not present

## 2018-07-14 DIAGNOSIS — Z8759 Personal history of other complications of pregnancy, childbirth and the puerperium: Secondary | ICD-10-CM | POA: Diagnosis not present

## 2018-07-14 DIAGNOSIS — Z0389 Encounter for observation for other suspected diseases and conditions ruled out: Secondary | ICD-10-CM | POA: Diagnosis not present

## 2018-07-14 DIAGNOSIS — Z7722 Contact with and (suspected) exposure to environmental tobacco smoke (acute) (chronic): Secondary | ICD-10-CM | POA: Diagnosis not present

## 2018-07-14 DIAGNOSIS — O34211 Maternal care for low transverse scar from previous cesarean delivery: Secondary | ICD-10-CM | POA: Diagnosis not present

## 2018-07-14 DIAGNOSIS — E663 Overweight: Secondary | ICD-10-CM | POA: Diagnosis not present

## 2018-07-14 LAB — OB RESULTS CONSOLE HIV ANTIBODY (ROUTINE TESTING): HIV: NONREACTIVE

## 2018-07-14 LAB — OB RESULTS CONSOLE RUBELLA ANTIBODY, IGM: Rubella: IMMUNE

## 2018-07-14 LAB — OB RESULTS CONSOLE VARICELLA ZOSTER ANTIBODY, IGG
Varicella: IMMUNE
Varicella: IMMUNE

## 2018-07-14 LAB — OB RESULTS CONSOLE HEPATITIS B SURFACE ANTIGEN
Hepatitis B Surface Ag: NEGATIVE
Hepatitis B Surface Ag: NEGATIVE

## 2018-07-14 LAB — OB RESULTS CONSOLE ABO/RH: RH Type: POSITIVE

## 2018-07-14 LAB — OB RESULTS CONSOLE HGB/HCT, BLOOD
HCT: 37 (ref 29–41)
Hemoglobin: 12.2

## 2018-07-14 LAB — OB RESULTS CONSOLE GC/CHLAMYDIA
Chlamydia: NEGATIVE
Gonorrhea: NEGATIVE

## 2018-07-14 LAB — OB RESULTS CONSOLE ANTIBODY SCREEN: Antibody Screen: NEGATIVE

## 2018-07-14 LAB — OB RESULTS CONSOLE RPR: RPR: NONREACTIVE

## 2018-07-15 ENCOUNTER — Other Ambulatory Visit (HOSPITAL_COMMUNITY): Payer: Self-pay | Admitting: Family

## 2018-07-15 ENCOUNTER — Other Ambulatory Visit (HOSPITAL_COMMUNITY): Payer: Medicaid Other

## 2018-07-15 DIAGNOSIS — Z3A13 13 weeks gestation of pregnancy: Secondary | ICD-10-CM

## 2018-07-15 DIAGNOSIS — Z3682 Encounter for antenatal screening for nuchal translucency: Secondary | ICD-10-CM

## 2018-07-28 ENCOUNTER — Encounter (HOSPITAL_COMMUNITY): Payer: Self-pay | Admitting: *Deleted

## 2018-08-01 ENCOUNTER — Other Ambulatory Visit: Payer: Self-pay

## 2018-08-01 ENCOUNTER — Ambulatory Visit (HOSPITAL_COMMUNITY): Payer: Medicaid Other

## 2018-08-01 ENCOUNTER — Ambulatory Visit (HOSPITAL_COMMUNITY)
Admission: RE | Admit: 2018-08-01 | Discharge: 2018-08-01 | Disposition: A | Discharge status: Home / Self Care | Payer: Medicaid Other | Source: Ambulatory Visit | Attending: Obstetrics and Gynecology | Admitting: Obstetrics and Gynecology

## 2018-08-01 ENCOUNTER — Other Ambulatory Visit (HOSPITAL_COMMUNITY): Payer: Self-pay

## 2018-08-01 ENCOUNTER — Ambulatory Visit (HOSPITAL_COMMUNITY): Payer: Self-pay | Admitting: Obstetrics and Gynecology

## 2018-08-01 ENCOUNTER — Encounter (HOSPITAL_COMMUNITY): Payer: Self-pay

## 2018-08-01 ENCOUNTER — Other Ambulatory Visit: Payer: Medicaid Other

## 2018-08-01 ENCOUNTER — Ambulatory Visit (HOSPITAL_COMMUNITY): Payer: Medicaid Other | Admitting: *Deleted

## 2018-08-01 VITALS — BP 126/79 | HR 96 | Temp 99.2°F | Wt 196.0 lb

## 2018-08-01 DIAGNOSIS — Z3682 Encounter for antenatal screening for nuchal translucency: Secondary | ICD-10-CM

## 2018-08-01 DIAGNOSIS — O09523 Supervision of elderly multigravida, third trimester: Secondary | ICD-10-CM

## 2018-08-01 DIAGNOSIS — O09521 Supervision of elderly multigravida, first trimester: Secondary | ICD-10-CM

## 2018-08-01 DIAGNOSIS — Z3A13 13 weeks gestation of pregnancy: Secondary | ICD-10-CM | POA: Insufficient documentation

## 2018-08-01 NOTE — Progress Notes (Signed)
Patient in session with Genetic Counselor, Karyn Schmeidler. 

## 2018-08-10 LAB — SMN1 COPY NUMBER ANALYSIS (SMA CARRIER SCREENING)

## 2018-08-12 LAB — MATERNIT 21 PLUS CORE, BLOOD
Fetal Fraction: 8
Result (T21): NEGATIVE
Trisomy 13 (Patau syndrome): NEGATIVE
Trisomy 18 (Edwards syndrome): NEGATIVE
Trisomy 21 (Down syndrome): NEGATIVE

## 2018-08-12 LAB — SMN1 COPY NUMBER ANALYSIS (SMA CARRIER SCREENING)

## 2018-08-12 LAB — CHROMOSOME ANALYSIS, FRAG X DNA

## 2018-08-24 DIAGNOSIS — Z8759 Personal history of other complications of pregnancy, childbirth and the puerperium: Secondary | ICD-10-CM | POA: Diagnosis not present

## 2018-08-24 DIAGNOSIS — O09519 Supervision of elderly primigravida, unspecified trimester: Secondary | ICD-10-CM | POA: Diagnosis not present

## 2018-08-24 DIAGNOSIS — Z7722 Contact with and (suspected) exposure to environmental tobacco smoke (acute) (chronic): Secondary | ICD-10-CM | POA: Diagnosis not present

## 2018-08-24 DIAGNOSIS — E663 Overweight: Secondary | ICD-10-CM | POA: Diagnosis not present

## 2018-08-24 DIAGNOSIS — O34211 Maternal care for low transverse scar from previous cesarean delivery: Secondary | ICD-10-CM | POA: Diagnosis not present

## 2018-08-24 DIAGNOSIS — Z3481 Encounter for supervision of other normal pregnancy, first trimester: Secondary | ICD-10-CM | POA: Diagnosis not present

## 2018-08-24 DIAGNOSIS — E739 Lactose intolerance, unspecified: Secondary | ICD-10-CM | POA: Diagnosis not present

## 2018-09-14 ENCOUNTER — Ambulatory Visit (HOSPITAL_COMMUNITY): Payer: Self-pay

## 2018-09-19 DIAGNOSIS — O99212 Obesity complicating pregnancy, second trimester: Secondary | ICD-10-CM | POA: Diagnosis not present

## 2018-09-19 DIAGNOSIS — O281 Abnormal biochemical finding on antenatal screening of mother: Secondary | ICD-10-CM | POA: Diagnosis not present

## 2018-09-20 ENCOUNTER — Encounter (HOSPITAL_COMMUNITY): Payer: Self-pay

## 2018-09-20 ENCOUNTER — Inpatient Hospital Stay (HOSPITAL_COMMUNITY): Payer: Medicaid Other

## 2018-09-20 ENCOUNTER — Inpatient Hospital Stay (HOSPITAL_COMMUNITY)
Admission: AD | Admit: 2018-09-20 | Discharge: 2018-09-20 | Disposition: A | Discharge status: Home / Self Care | Payer: Medicaid Other | Attending: Obstetrics and Gynecology | Admitting: Obstetrics and Gynecology

## 2018-09-20 ENCOUNTER — Other Ambulatory Visit: Payer: Self-pay

## 2018-09-20 DIAGNOSIS — O99612 Diseases of the digestive system complicating pregnancy, second trimester: Secondary | ICD-10-CM | POA: Diagnosis not present

## 2018-09-20 DIAGNOSIS — R1012 Left upper quadrant pain: Secondary | ICD-10-CM | POA: Insufficient documentation

## 2018-09-20 DIAGNOSIS — K219 Gastro-esophageal reflux disease without esophagitis: Secondary | ICD-10-CM | POA: Diagnosis not present

## 2018-09-20 DIAGNOSIS — R319 Hematuria, unspecified: Secondary | ICD-10-CM

## 2018-09-20 DIAGNOSIS — O98312 Other infections with a predominantly sexual mode of transmission complicating pregnancy, second trimester: Secondary | ICD-10-CM | POA: Diagnosis not present

## 2018-09-20 DIAGNOSIS — A5901 Trichomonal vulvovaginitis: Secondary | ICD-10-CM | POA: Insufficient documentation

## 2018-09-20 DIAGNOSIS — Z3A26 26 weeks gestation of pregnancy: Secondary | ICD-10-CM

## 2018-09-20 DIAGNOSIS — Z3A20 20 weeks gestation of pregnancy: Secondary | ICD-10-CM | POA: Diagnosis not present

## 2018-09-20 DIAGNOSIS — A599 Trichomoniasis, unspecified: Secondary | ICD-10-CM

## 2018-09-20 DIAGNOSIS — O26892 Other specified pregnancy related conditions, second trimester: Secondary | ICD-10-CM | POA: Insufficient documentation

## 2018-09-20 DIAGNOSIS — R109 Unspecified abdominal pain: Secondary | ICD-10-CM

## 2018-09-20 LAB — COMPREHENSIVE METABOLIC PANEL
ALT: 20 U/L (ref 0–44)
AST: 18 U/L (ref 15–41)
Albumin: 3.1 g/dL — ABNORMAL LOW (ref 3.5–5.0)
Alkaline Phosphatase: 34 U/L — ABNORMAL LOW (ref 38–126)
Anion gap: 10 (ref 5–15)
BUN: 6 mg/dL (ref 6–20)
CO2: 21 mmol/L — ABNORMAL LOW (ref 22–32)
Calcium: 9.2 mg/dL (ref 8.9–10.3)
Chloride: 104 mmol/L (ref 98–111)
Creatinine, Ser: 0.58 mg/dL (ref 0.44–1.00)
GFR calc Af Amer: >60 mL/min (ref >60)
GFR calc non Af Amer: >60 mL/min (ref >60)
Glucose, Bld: 88 mg/dL (ref 70–99)
Potassium: 3.7 mmol/L (ref 3.5–5.1)
Sodium: 135 mmol/L (ref 135–145)
Total Bilirubin: 0.7 mg/dL (ref 0.3–1.2)
Total Protein: 6.9 g/dL (ref 6.5–8.1)

## 2018-09-20 LAB — CBC
HCT: 34.7 % — ABNORMAL LOW (ref 36.0–46.0)
Hemoglobin: 11.5 g/dL — ABNORMAL LOW (ref 12.0–15.0)
MCH: 28.8 pg (ref 26.0–34.0)
MCHC: 33.1 g/dL (ref 30.0–36.0)
MCV: 86.8 fL (ref 80.0–100.0)
Platelets: 269 10*3/uL (ref 150–400)
RBC: 4 MIL/uL (ref 3.87–5.11)
RDW: 13.1 % (ref 11.5–15.5)
WBC: 14.3 10*3/uL — ABNORMAL HIGH (ref 4.0–10.5)
nRBC: 0 % (ref 0.0–0.2)

## 2018-09-20 LAB — URINALYSIS, ROUTINE W REFLEX MICROSCOPIC
Bilirubin Urine: NEGATIVE
Glucose, UA: NEGATIVE mg/dL
Ketones, ur: NEGATIVE mg/dL
Nitrite: NEGATIVE
Protein, ur: NEGATIVE mg/dL
Specific Gravity, Urine: 1.016 (ref 1.005–1.030)
pH: 6 (ref 5.0–8.0)

## 2018-09-20 LAB — LIPASE, BLOOD: Lipase: 21 U/L (ref 11–51)

## 2018-09-20 MED ORDER — IBUPROFEN 600 MG PO TABS: 600.0000 mg | ORAL_TABLET | Freq: Four times a day (QID) | ORAL | 0 refills | Status: DC | PRN

## 2018-09-20 MED ORDER — FAMOTIDINE 20 MG PO TABS: 20.0000 mg | ORAL_TABLET | Freq: Two times a day (BID) | ORAL | 1 refills | Status: DC

## 2018-09-20 MED ORDER — METRONIDAZOLE 500 MG PO TABS
2000.0000 mg | ORAL_TABLET | Freq: Once | ORAL | Status: AC
  Administered 2018-09-20: 2000 mg via ORAL
  Filled 2018-09-20 (×2): qty 4

## 2018-09-20 NOTE — MAU Provider Note (Signed)
History     CSN: [id number]  Arrival date and time: 09/20/18 1300   First Provider Initiated Contact with Patient 09/20/18 1631      Chief Complaint  Patient presents with  . Abdominal Pain   HPI   Colleen Wells is 33 y.o. female [phone] @ [redacted]w[redacted]d here with left upper quadrant pain that started acutely at 0300. The pain did not wake her up, however she kept tossing and turning and could not fall asleep. She woke up and ate some apple slices which did not help.  Laying on her left side felt a little better. She feels the pain when she is sitting or moving. The pain is constant. She took tylenol which did not help. No vaginal bleeding. + flutters in belly. She feels the pain in the LUQ, at times it moves to her belly button and other times it radiates to the right upper part of her back. She denies lower abdominal pain or contraction like pain. Admits to consuming large amounts of spicy foods and fried foods.  No recent injury or cough.   OB History    Gravida  6   Para  4   Term  4   Preterm      AB  1   Living  4     SAB  1   TAB      Ectopic      Multiple      Live Births              Past Medical History:  Diagnosis Date  . Arthrofibrosis of knee joint    right  . Medical history non-contributory   . Meniscus tear     Past Surgical History:  Procedure Laterality Date  . ANTERIOR CRUCIATE LIGAMENT REPAIR Right 09/03/2015   Procedure: REPAIR ANTERIOR CRUCIATE LIGAMENT  REPAIR OF AVULSION;  Surgeon: Cammy Copa, MD;  Location: [facility] OR;  Service: Orthopedics;  Laterality: Right;  . CESAREAN SECTION    . KNEE CLOSED REDUCTION Right 10/08/2015   Procedure: CLOSED MANIPULATION KNEE UNDER ANESTHESIA;  Surgeon: Cammy Copa, MD;  Location[facility] OR;  Service: Orthopedics;  Laterality: Right;  . MENISCUS REPAIR Right 09/03/2015   Procedure: REPAIR OF MENISCUS LATERAL;  Surgeon: Cammy Copa, MD;  Location: [facility] OR;  Service: Orthopedics;   Laterality: Right;    History reviewed. No pertinent family history.  Social History   Tobacco Use  . Smoking status: Never Smoker  . Smokeless tobacco: Never Used  Substance Use Topics  . Alcohol use: No  . Drug use: No    Allergies:  Allergies  Allergen Reactions  . No Known Allergies     Medications Prior to Admission  Medication Sig Dispense Refill Last Dose  . Pediatric Multiple Vit-C-FA (FLINSTONES GUMMIES OMEGA-3 DHA) CHEW Chew by mouth.   09/20/2018 at Unknown time  . Doxylamine-Pyridoxine 10-10 MG TBEC Take two tabs daily as needed for nausea. (Patient not taking: Reported on 08/01/2018) 60 tablet 0   . naproxen sodium (ANAPROX) 220 MG tablet Take 220 mg by mouth 2 (two) times daily with a meal.     . oxyCODONE (OXY IR/ROXICODONE) 5 MG immediate release tablet Take 1 tablet (5 mg total) by mouth every 6 (six) hours as needed for moderate pain. (Patient not taking: Reported on 08/01/2018) 20 tablet 0    Results for orders placed or performed during the hospital encounter of 09/20/18 (from the past 48 hour(s))  Urinalysis, Routine w  reflex microscopic     Status: Abnormal   Collection Time: 09/20/18  2:20 PM  Result Value Ref Range   Color, Urine YELLOW YELLOW   APPearance HAZY (A) CLEAR   Specific Gravity, Urine 1.016 1.005 - 1.030   pH 6.0 5.0 - 8.0   Glucose, UA NEGATIVE NEGATIVE mg/dL   Hgb urine dipstick SMALL (A) NEGATIVE   Bilirubin Urine NEGATIVE NEGATIVE   Ketones, ur NEGATIVE NEGATIVE mg/dL   Protein, ur NEGATIVE NEGATIVE mg/dL   Nitrite NEGATIVE NEGATIVE   Leukocytes,Ua LARGE (A) NEGATIVE   RBC / HPF 6-10 0 - 5 RBC/hpf   WBC, UA 11-20 0 - 5 WBC/hpf   Bacteria, UA RARE (A) NONE SEEN   Squamous Epithelial / LPF 0-5 0 - 5   Mucus PRESENT    Trichomonas, UA PRESENT (A) NONE SEEN    Comment: Performed at [facility], [place] [address], [place], [place] [address]  CBC     Status: Abnormal   Collection Time: 09/20/18  3:02 PM  Result Value Ref Range    WBC 14.3 (H) 4.0 - 10.5 K/uL   RBC 4.00 3.87 - 5.11 MIL/uL   Hemoglobin 11.5 (L) 12.0 - 15.0 g/dL   HCT 34.7 (L) 36.0 - 46.0 %   MCV 86.8 80.0 - 100.0 fL   MCH 28.8 26.0 - 34.0 pg   MCHC 33.1 30.0 - 36.0 g/dL   RDW 13.1 11.5 - 15.5 %   Platelets 269 150 - 400 K/uL   nRBC 0.0 0.0 - 0.2 %    Comment: Performed at [facility], [place] [address] [place], [place] [address]  Comprehensive metabolic panel     Status: Abnormal   Collection Time: 09/20/18  3:02 PM  Result Value Ref Range   Sodium 135 135 - 145 mmol/L   Potassium 3.7 3.5 - 5.1 mmol/L   Chloride 104 98 - 111 mmol/L   CO2 21 (L) 22 - 32 mmol/L   Glucose, Bld 88 70 - 99 mg/dL   BUN 6 6 - 20 mg/dL   Creatinine, Ser 0.58 0.44 - 1.00 mg/dL   Calcium 9.2 8.9 - 10.3 mg/dL   Total Protein 6.9 6.5 - 8.1 g/dL   Albumin 3.1 (L) 3.5 - 5.0 g/dL   AST 18 15 - 41 U/L   ALT 20 0 - 44 U/L   Alkaline Phosphatase 34 (L) 38 - 126 U/L   Total Bilirubin 0.7 0.3 - 1.2 mg/dL   GFR calc non Af Amer >60 >60 mL/min   GFR calc Af Amer >60 >60 mL/min   Anion gap 10 5 - 15    Comment: Performed at [place] [address] [place], [place] [address]  Lipase, blood     Status: None   Collection Time: 09/20/18  3:02 PM  Result Value Ref Range   Lipase 21 11 - 51 U/L    Comment: Performed at [facility], [place] [address] [place], [place] [address]   US Renal  Result Date: 09/20/2018 CLINICAL DATA:  Left upper quadrant abdomen pain and hematuria EXAM: RENAL / URINARY TRACT ULTRASOUND COMPLETE COMPARISON:  None. FINDINGS: Right Kidney: Renal measurements: 12.7 x 4.6 x 5.4 cm = volume: 165.5 mL . Echogenicity within normal limits. No mass or hydronephrosis visualized. Left Kidney: Renal measurements: 11.4 x 6.7 x 5.5 cm = volume: 220 mL. Echogenicity within normal limits. No mass or hydronephrosis visualized. Bladder: Decompressed limiting evaluation. IMPRESSION: Normal  renal ultrasound. Electronically Signed   By: Sherian ReinWei-Chen  Lin M.D.    On: 09/20/2018 18:00   Review of Systems  Constitutional: Negative for fever.  Gastrointestinal: Positive for abdominal pain. Negative for nausea and vomiting.   Physical Exam   Blood pressure 120/74, pulse 94, temperature 98.7 F (37.1 C), resp. rate 16, last menstrual period 04/30/2018.  Physical Exam  Constitutional: She is oriented to person, place, and time. She appears well-developed and well-nourished. No distress.  HENT:  Head: Normocephalic.  GI: Soft. Normal appearance. There is abdominal tenderness in the epigastric area. There is no rigidity, no guarding, no tenderness at McBurney's point and negative Murphy's sign.  Musculoskeletal: Normal range of motion.  Neurological: She is alert and oriented to person, place, and time.  Skin: Skin is warm. She is not diaphoretic.  Psychiatric: Her behavior is normal.    MAU Course  Procedures   None  MDM  Urine culture sent US Renal negative Flagyl given 2 grams today in MAU.    Expedited Partner Therapy:  Information Sheet for Patients and Partners               You have been offered expedited partner therapy (EPT). This information sheet contains important information and warnings you need to be aware of, so please read it carefully.   Expedited Partner Therapy (EPT) is the clinical practice of treating the sexual partners of persons who receive chlamydia, gonorrhea, or trichomoniasis diagnoses by providing medications or prescriptions to the patient. Patients then provide partners with these therapies without the health-care provider having examined the partner. In other words, EPT is a convenient, fast and private way for patients to help their sexual partners get treated.   Chlamydia and gonorrhea are bacterial infections you get from having sex with a person who is already infected. Trichomoniasis (or "trich") is a very common sexually transmitted infection (STI) that is caused by infection with a protozoan parasite  called Trichomonas vaginalis.  Many people with these infections don't know it because they feel fine, but without treatment these infections can cause serious health problems, such as pelvic inflammatory disease, ectopic pregnancy, infertility and increased risk of HIV.   It is important to get treated as soon as possible to protect your health, to avoid spreading these infections to others, and to prevent yourself from becoming re-infected. The good news is these infections can be easily cured with proper antibiotic medicine. The best way to take care of your self is to see a doctor or go to your local health department. If you are not able to see a doctor or other medical provider, you should take EPT.    Recommended Medication: EPT for Chlamydia:  Azithromycin (Zithromax) 1 gram orally in a single dose EPT for Gonorrhea:  Cefixime (Suprax) 400 milligrams orally in a single dose PLUS azithromycin (Zithromax) 1 gram orally in a single dose EPT for Trichomoniasis:  Metronidazole (Flagyl) 2 grams orally in a single dose   These medicines are very safe. However, you should not take them if you have ever had an allergic reaction (like a rash) to any of these medicines: azithromycin (Zithromax), erythromycin, clarithromycin (Biaxin), metronidazole (Flagyl), tinidazole (Tindimax). If you are uncertain about whether you have an allergy, call your medical provider or pharmacist before taking this medicine. If you have a serious, long-term illness like kidney, liver or heart disease, colitis or stomach problems, or you are currently taking other prescription medication, talk to your provider before taking this  medication.   Women: If you have lower belly pain, pain during sex, vomiting, or a fever, do not take this medicine. Instead, you should see a medical provider to be certain you do not have pelvic inflammatory disease (PID). PID can be serious and lead to infertility, pregnancy problems or chronic pelvic  pain.   Pregnant Women: It is very important for you to see a doctor to get pregnancy services and pre-natal care. These antibiotics for EPT are safe for pregnant women, but you still need to see a medical provider as soon as possible. It is also important to note that Doxycycline is an alternative therapy for chlamydia, but it should not be taken by someone who is pregnant.   Men: If you have pain or swelling in the testicles or a fever, do not take this medicine and see a medical provider.     Men who have sex with men (MSM): MSM in [place] continue to experience high rates of syphilis and HIV. Many MSM with gonorrhea or chlamydia could also have syphilis and/or HIV and not know it. If you are a man who has sex with other men, it is very important that you see a medical provider and are tested for HIV and syphilis. EPT is not recommended for gonorrhea for MSM.  Recommended treatment for gonorrhea for MSM is Rocephin (shot) AND azithromycin due to decreased cure rate.  Please see your medical provider if this is the case.    Along with this information sheet is a prescription for the medicine. If you receive a prescription it will be in your name and will indicate your date of birth, or it will be in the name of "Expedited Partner Therapy".   In either case, you can have the prescription filled at a pharmacy. You will be responsible for the cost of the medicine, unless you have prescription drug coverage. In that case, you could provide your name so the pharmacy could bill your health plan.   Take the medication as directed. Some people will have a mild, upset stomach, which does not last long. AVOID alcohol 24 hours after taking metronidazole (Flagyl) to reduce the possibility of a disulfiram-like reaction (severe vomiting and abdominal pain).  After taking the medicine, do not have sex for 7 days. Do not share this medicine or give it to anyone else. It is important to tell everyone you have had  sex with in the last 60 days that they need to go and get tested for sexually transmitted infections.   Ways to prevent these and other sexually transmitted infections (STIs):   Marland Kitchen. Abstain from sex. This is the only sure way to avoid getting an STI.  Marland Kitchen. Use barrier methods, such as condoms, consistently and correctly.  . Limit the number of sexual partners.  . Have regular physical exams, including testing for STIs.   For more information about EPT or other issues pertaining to an STI, please contact your medical provider or the [facility] at [phone] or [email or web address]   Assessment and Plan   A:  1. Gastroesophageal reflux disease, esophagitis presence not specified   2. LUQ abdominal pain   3. Hematuria   4. Trichomonas infection     P:  Discharge home in stable condition Expedited partner therapy given.  Rx: Pepcid, take 1 dose of ibuprofen when you get home.  F/u with OB as scheduled Avoid fried foods and spicy foods  Return to MAU if symptoms  worsen   Venia CarbonJennifer Syvanna Ciolino 09/20/2018, 4:35 PM

## 2018-09-20 NOTE — Discharge Instructions (Signed)

## 2018-09-20 NOTE — MAU Note (Signed)
.   Colleen Wells is a 33 y.o. at [redacted]w[redacted]d here in MAU reporting: pain in her upper stomach radiating into her back since 3 this morning.Denies any vaginal bleeding  Onset of complaint: 0300am Pain score: 6 Vitals:   09/20/18 1417  BP: 123/78  Pulse: 87  Resp: 16  Temp: 98.7 F (37.1 C)     FHT:147 Lab orders placed from triage: UA

## 2018-09-21 LAB — CULTURE, OB URINE
Culture: <10000 — AB
Special Requests: NORMAL

## 2018-10-17 DIAGNOSIS — Z3482 Encounter for supervision of other normal pregnancy, second trimester: Secondary | ICD-10-CM | POA: Diagnosis not present

## 2018-10-24 DIAGNOSIS — O358XX0 Maternal care for other (suspected) fetal abnormality and damage, not applicable or unspecified: Secondary | ICD-10-CM | POA: Diagnosis not present

## 2018-10-24 DIAGNOSIS — O283 Abnormal ultrasonic finding on antenatal screening of mother: Secondary | ICD-10-CM | POA: Diagnosis not present

## 2018-11-14 DIAGNOSIS — Z7722 Contact with and (suspected) exposure to environmental tobacco smoke (acute) (chronic): Secondary | ICD-10-CM | POA: Diagnosis not present

## 2018-11-14 DIAGNOSIS — Z3009 Encounter for other general counseling and advice on contraception: Secondary | ICD-10-CM | POA: Diagnosis not present

## 2018-11-14 DIAGNOSIS — Z8759 Personal history of other complications of pregnancy, childbirth and the puerperium: Secondary | ICD-10-CM | POA: Diagnosis not present

## 2018-11-14 DIAGNOSIS — Z3483 Encounter for supervision of other normal pregnancy, third trimester: Secondary | ICD-10-CM | POA: Diagnosis not present

## 2018-11-14 DIAGNOSIS — O09519 Supervision of elderly primigravida, unspecified trimester: Secondary | ICD-10-CM | POA: Diagnosis not present

## 2018-11-14 DIAGNOSIS — Z1388 Encounter for screening for disorder due to exposure to contaminants: Secondary | ICD-10-CM | POA: Diagnosis not present

## 2018-11-14 DIAGNOSIS — Z23 Encounter for immunization: Secondary | ICD-10-CM | POA: Diagnosis not present

## 2018-11-14 DIAGNOSIS — O34211 Maternal care for low transverse scar from previous cesarean delivery: Secondary | ICD-10-CM | POA: Diagnosis not present

## 2018-11-14 DIAGNOSIS — Z0389 Encounter for observation for other suspected diseases and conditions ruled out: Secondary | ICD-10-CM | POA: Diagnosis not present

## 2018-11-14 DIAGNOSIS — E739 Lactose intolerance, unspecified: Secondary | ICD-10-CM | POA: Diagnosis not present

## 2018-11-14 DIAGNOSIS — E663 Overweight: Secondary | ICD-10-CM | POA: Diagnosis not present

## 2018-11-14 LAB — OB RESULTS CONSOLE HIV ANTIBODY (ROUTINE TESTING): HIV: NONREACTIVE

## 2018-11-14 LAB — OB RESULTS CONSOLE HGB/HCT, BLOOD
HCT: 35 (ref 29–41)
Hemoglobin: 10.9

## 2018-11-14 LAB — OB RESULTS CONSOLE RPR: RPR: NONREACTIVE

## 2018-11-14 LAB — GLUCOSE TOLERANCE, 1 HOUR: GTT, 1 hr: 164

## 2018-11-16 DIAGNOSIS — O34211 Maternal care for low transverse scar from previous cesarean delivery: Secondary | ICD-10-CM | POA: Diagnosis not present

## 2018-11-16 DIAGNOSIS — O99810 Abnormal glucose complicating pregnancy: Secondary | ICD-10-CM | POA: Diagnosis not present

## 2018-11-16 DIAGNOSIS — Z3483 Encounter for supervision of other normal pregnancy, third trimester: Secondary | ICD-10-CM | POA: Diagnosis not present

## 2018-11-16 DIAGNOSIS — O24419 Gestational diabetes mellitus in pregnancy, unspecified control: Secondary | ICD-10-CM | POA: Diagnosis not present

## 2018-11-16 DIAGNOSIS — Z8759 Personal history of other complications of pregnancy, childbirth and the puerperium: Secondary | ICD-10-CM | POA: Diagnosis not present

## 2018-11-16 DIAGNOSIS — Z23 Encounter for immunization: Secondary | ICD-10-CM | POA: Diagnosis not present

## 2018-11-16 DIAGNOSIS — E663 Overweight: Secondary | ICD-10-CM | POA: Diagnosis not present

## 2018-11-16 DIAGNOSIS — E739 Lactose intolerance, unspecified: Secondary | ICD-10-CM | POA: Diagnosis not present

## 2018-11-16 DIAGNOSIS — O09519 Supervision of elderly primigravida, unspecified trimester: Secondary | ICD-10-CM | POA: Diagnosis not present

## 2018-11-16 DIAGNOSIS — O99013 Anemia complicating pregnancy, third trimester: Secondary | ICD-10-CM | POA: Diagnosis not present

## 2018-11-16 DIAGNOSIS — Z7722 Contact with and (suspected) exposure to environmental tobacco smoke (acute) (chronic): Secondary | ICD-10-CM | POA: Diagnosis not present

## 2018-11-16 LAB — GLUCOSE TOLERANCE, 3 HOURS
GTT, 1 hr: 190
Glucose 2 Hour: 144
Glucose 3 Hour: 97
Glucose Tolerance, Fasting: 100 — AB (ref 70–99)

## 2018-11-28 ENCOUNTER — Encounter: Payer: Self-pay | Admitting: *Deleted

## 2018-11-29 ENCOUNTER — Encounter: Payer: Self-pay | Admitting: *Deleted

## 2018-11-29 ENCOUNTER — Other Ambulatory Visit: Payer: Self-pay | Admitting: *Deleted

## 2018-11-29 ENCOUNTER — Other Ambulatory Visit: Payer: Self-pay

## 2018-11-29 ENCOUNTER — Other Ambulatory Visit (HOSPITAL_COMMUNITY)
Admission: RE | Admit: 2018-11-29 | Discharge: 2018-11-29 | Disposition: A | Discharge status: Home / Self Care | Payer: Medicaid Other | Source: Ambulatory Visit | Attending: Family Medicine | Admitting: Family Medicine

## 2018-11-29 ENCOUNTER — Encounter: Payer: Self-pay | Admitting: Family Medicine

## 2018-11-29 ENCOUNTER — Ambulatory Visit (INDEPENDENT_AMBULATORY_CARE_PROVIDER_SITE_OTHER): Payer: Medicaid Other | Admitting: Family Medicine

## 2018-11-29 VITALS — BP 112/70 | HR 112 | Temp 98.4°F | Wt 218.0 lb

## 2018-11-29 DIAGNOSIS — Z98891 History of uterine scar from previous surgery: Secondary | ICD-10-CM

## 2018-11-29 DIAGNOSIS — O0990 Supervision of high risk pregnancy, unspecified, unspecified trimester: Secondary | ICD-10-CM

## 2018-11-29 DIAGNOSIS — O23592 Infection of other part of genital tract in pregnancy, second trimester: Secondary | ICD-10-CM | POA: Diagnosis not present

## 2018-11-29 DIAGNOSIS — O24419 Gestational diabetes mellitus in pregnancy, unspecified control: Secondary | ICD-10-CM

## 2018-11-29 DIAGNOSIS — Z3A30 30 weeks gestation of pregnancy: Secondary | ICD-10-CM

## 2018-11-29 DIAGNOSIS — A5901 Trichomonal vulvovaginitis: Secondary | ICD-10-CM

## 2018-11-29 DIAGNOSIS — O23593 Infection of other part of genital tract in pregnancy, third trimester: Secondary | ICD-10-CM

## 2018-11-29 DIAGNOSIS — O23599 Infection of other part of genital tract in pregnancy, unspecified trimester: Secondary | ICD-10-CM | POA: Insufficient documentation

## 2018-11-29 DIAGNOSIS — Z3480 Encounter for supervision of other normal pregnancy, unspecified trimester: Secondary | ICD-10-CM

## 2018-11-29 HISTORY — DX: History of uterine scar from previous surgery: Z98.891

## 2018-11-29 NOTE — Progress Notes (Signed)
Subjective:  Colleen Wells is a 33 y.o. [id number] at 110w3d being seen today for ongoing prenatal care.  She is currently monitored for the following issues for this high-risk pregnancy and has Supervision of high risk pregnancy, antepartum; History of C-section; Gestational diabetes mellitus (GDM) affecting pregnancy, antepartum; and Trichomonal vaginitis during pregnancy on their problem list.  Patient reports no complaints.  Contractions: Irregular. Vag. Bleeding: None.  Movement: Present. Denies leaking of fluid.   The following portions of the patient's history were reviewed and updated as appropriate: allergies, current medications, past family history, past medical history, past social history, past surgical history and problem list. Problem list updated.  Objective:   Vitals:   11/29/18 1348  BP: 112/70  Pulse: (!) 112  Temp: 98.4 F (36.9 C)  Weight: 218 lb (98.9 kg)    Fetal Status:     Movement: Present     General:  Alert, oriented and cooperative. Patient is in no acute distress.  Skin: Skin is warm and dry. No rash noted.   Cardiovascular: Normal heart rate noted  Respiratory: Normal respiratory effort, no problems with respiration noted  Abdomen: Soft, gravid, appropriate for gestational age. Pain/Pressure: Absent     Pelvic: Vag. Bleeding: None     Cervical exam deferred        Extremities: Normal range of motion.  Edema: None  Mental Status: Normal mood and affect. Normal behavior. Normal judgment and thought content.    Assessment and Plan:  Pregnancy: [id number] at [redacted]w[redacted]d  1. Supervision of other normal pregnancy, antepartum  2. History of C-section -hx CS x4 -repeat CS at 39 weeks  3. Gestational diabetes mellitus (GDM) affecting pregnancy, antepartum -referral to Diabetes Education  4. Trichomonal vaginitis during pregnancy in second trimester -Treated in July, TOC done today  Preterm labor symptoms and general obstetric precautions including but not  limited to vaginal bleeding, contractions, leaking of fluid and fetal movement were reviewed in detail with the patient. Please refer to After Visit Summary for other counseling recommendations.  Return in about 2 weeks (around 12/13/2018) for 1 week Bev (diabetes educator), 2 weeks HOB.   Leasha Goldberger L, DO

## 2018-11-29 NOTE — Patient Instructions (Signed)
Gestational Diabetes Mellitus, Diagnosis Gestational diabetes (gestational diabetes mellitus) is a short-term (temporary) form of diabetes that can happen during pregnancy. It goes away after you give birth. It may be caused by one or both of these problems:  Your pancreas does not make enough of a hormone called insulin.  Your body does not respond in a normal way to insulin that it makes. Insulin lets sugars (glucose) go into cells in the body. This gives you energy. If you have diabetes, sugars cannot get into cells. This causes high blood sugar (hyperglycemia). If you get gestational diabetes, you are:  More likely to get it if you get pregnant again.  More likely to develop type 2 diabetes in the future. If gestational diabetes is treated, it may not hurt you or your baby. Your doctor will set treatment goals for you. In general, you should have these blood sugar levels:  After not eating for a long time (fasting): 95 mg/dL (5.3 mmol/L).  After meals (postprandial): ? One hour after a meal: at or below 140 mg/dL (7.8 mmol/L). ? Two hours after a meal: at or below 120 mg/dL (6.7 mmol/L).  A1c (hemoglobin A1c) level: 6-6.5%. Follow these instructions at home: Questions to ask your doctor   You may want to ask these questions: ? Do I need to meet with a diabetes educator? ? What equipment will I need to care for myself at home? ? What medicines do I need? When should I take them? ? How often do I need to check my blood sugar? ? What number can I call if I have questions? ? When is my next doctor's visit? General instructions  Take over-the-counter and prescription medicines only as told by your doctor.  Stay at a healthy weight during pregnancy.  Keep all follow-up visits as told by your doctor. This is important. Contact a doctor if:  Your blood sugar is at or above 240 mg/dL (13.3 mmol/L).  Your blood sugar is at or above 200 mg/dL (11.1 mmol/L) and you have ketones in  your pee (urine).  You have been sick or have had a fever for 2 days or more and you are not getting better.  You have any of these problems for more than 6 hours: ? You cannot eat or drink. ? You feel sick to your stomach (nauseous). ? You throw up (vomit). ? You have watery poop (diarrhea). Get help right away if:  Your blood sugar is lower than 54 mg/dL (3 mmol/L).  You get confused.  You have trouble: ? Thinking clearly. ? Breathing.  Your baby moves less than normal.  You have any of these: ? Moderate or large ketone levels in your pee. ? Blood coming from your vagina. ? Unusual fluid coming from your vagina. ? Early contractions. These may feel like tightness in your belly. Summary  Gestational diabetes is a short-term form of diabetes. It can happen while you are pregnant. It goes away after you give birth.  If gestational diabetes is treated, it may not hurt you or your baby. Your doctor will set treatment goals for you.  Keep all follow-up visits as told by your doctor. This is important. This information is not intended to replace advice given to you by your health care provider. Make sure you discuss any questions you have with your health care provider. Document Released: 06/03/2015 Document Revised: 03/18/2017 Document Reviewed: 03/15/2015 Elsevier Patient Education  2020 Elsevier Inc.  

## 2018-12-01 ENCOUNTER — Encounter: Payer: Self-pay | Admitting: *Deleted

## 2018-12-08 ENCOUNTER — Ambulatory Visit: Payer: Medicaid Other | Admitting: *Deleted

## 2018-12-08 ENCOUNTER — Other Ambulatory Visit: Payer: Self-pay | Admitting: Lactation Services

## 2018-12-08 ENCOUNTER — Encounter: Payer: Medicaid Other | Attending: Obstetrics and Gynecology | Admitting: *Deleted

## 2018-12-08 ENCOUNTER — Other Ambulatory Visit: Payer: Self-pay

## 2018-12-08 DIAGNOSIS — O24419 Gestational diabetes mellitus in pregnancy, unspecified control: Secondary | ICD-10-CM | POA: Insufficient documentation

## 2018-12-08 DIAGNOSIS — Z3A00 Weeks of gestation of pregnancy not specified: Secondary | ICD-10-CM | POA: Insufficient documentation

## 2018-12-08 LAB — CERVICOVAGINAL ANCILLARY ONLY
Bacterial Vaginitis (gardnerella): NEGATIVE
Candida Glabrata: NEGATIVE
Candida Vaginitis: NEGATIVE
Chlamydia: NEGATIVE
Comment: NEGATIVE
Comment: NEGATIVE
Comment: NEGATIVE
Comment: NEGATIVE
Comment: NEGATIVE
Comment: NORMAL
Neisseria Gonorrhea: NEGATIVE
Trichomonas: NEGATIVE

## 2018-12-08 MED ORDER — ACCU-CHEK GUIDE VI STRP: ORAL_STRIP | 12 refills | Status: DC

## 2018-12-08 MED ORDER — ACCU-CHEK GUIDE W/DEVICE KIT: 1.0000 | PACK | Freq: Once | 0 refills | Status: AC

## 2018-12-08 MED ORDER — ACCU-CHEK FASTCLIX LANCETS MISC: 1.0000 | Freq: Four times a day (QID) | 12 refills | Status: DC

## 2018-12-08 NOTE — Progress Notes (Signed)
  Patient was seen on 12/08/2018 for Gestational Diabetes self-management. EDD 02/04/2019 but states C-section is planned for 01/29/2019. Patient states history of GDM with last pregnancy 6 years ago. Diet history obtained. Patient eats good variety of all food groups. Beverages include water and diet soda.  The following learning objectives were met by the patient :   States the definition of Gestational Diabetes  States why dietary management is important in controlling blood glucose  Describes the effects of carbohydrates on blood glucose levels  Demonstrates ability to create a balanced meal plan  Demonstrates carbohydrate counting   States when to check blood glucose levels  Demonstrates proper blood glucose monitoring techniques  States the effect of stress and exercise on blood glucose levels  States the importance of limiting caffeine and abstaining from alcohol and smoking  Plan:  Aim for 3 Carb Choices per meal (45 grams) +/- 1 either way  Aim for 1-2 Carbs per snack Begin reading food labels for Total Carbohydrate of foods If OK with your MD, consider  increasing your activity level by walking, Arm Chair Exercises or other activity daily as tolerated Begin checking BG before breakfast and 2 hours after first bite of breakfast, lunch and dinner as directed by MD  Bring Log Book/Sheet and meter to every medical appointment OR use Baby Scripts (see below) Baby Scripts:  Patient was introduced to [employer] and plans to use as record of BG electronically  Take medication if directed by MD  Blood glucose monitor Rx called into pharmacy: Accu Check Guide with Fast Clix drums Patient instructed to test pre breakfast and 2 hours each meal as directed by MD  Patient instructed to monitor glucose levels: FBS: 60 - 95 mg/dl 2 hour: <120 mg/dl  Patient received the following handouts:  Nutrition Diabetes and Pregnancy  Carbohydrate Counting List  Patient will be seen for  follow-up as needed.

## 2018-12-08 NOTE — Progress Notes (Signed)
Blood Glucose monitoring supplies ordered per Bev Paddock, Diabetes Educator.

## 2018-12-14 ENCOUNTER — Other Ambulatory Visit: Payer: Self-pay

## 2018-12-14 ENCOUNTER — Ambulatory Visit (INDEPENDENT_AMBULATORY_CARE_PROVIDER_SITE_OTHER): Payer: Medicaid Other | Admitting: Family Medicine

## 2018-12-14 VITALS — BP 123/69 | HR 96 | Wt 219.0 lb

## 2018-12-14 DIAGNOSIS — O0993 Supervision of high risk pregnancy, unspecified, third trimester: Secondary | ICD-10-CM

## 2018-12-14 DIAGNOSIS — O24419 Gestational diabetes mellitus in pregnancy, unspecified control: Secondary | ICD-10-CM

## 2018-12-14 DIAGNOSIS — Z3480 Encounter for supervision of other normal pregnancy, unspecified trimester: Secondary | ICD-10-CM

## 2018-12-14 DIAGNOSIS — Z3A32 32 weeks gestation of pregnancy: Secondary | ICD-10-CM

## 2018-12-14 DIAGNOSIS — O0990 Supervision of high risk pregnancy, unspecified, unspecified trimester: Secondary | ICD-10-CM

## 2018-12-14 MED ORDER — PREPLUS 27-1 MG PO TABS: 1.0000 | ORAL_TABLET | Freq: Every day | ORAL | 2 refills | Status: DC

## 2018-12-14 NOTE — Progress Notes (Signed)
    PRENATAL VISIT NOTE  Subjective:  Colleen Wells is a 33 y.o. [id number] at [redacted]w[redacted]d being seen today for ongoing prenatal care.  She is currently monitored for the following issues for this high-risk pregnancy and has Supervision of high risk pregnancy, antepartum; History of C-section; Gestational diabetes mellitus (GDM) affecting pregnancy, antepartum; and Trichomonal vaginitis during pregnancy on their problem list.  Patient reports no complaints.  Contractions: Irritability. Vag. Bleeding: None.  Movement: Present. Denies leaking of fluid.   The following portions of the patient's history were reviewed and updated as appropriate: allergies, current medications, past family history, past medical history, past social history, past surgical history and problem list.   Objective:   Vitals:   12/14/18 1531  BP: 123/69  Pulse: 96  Weight: 219 lb (99.3 kg)    Fetal Status: Fetal Heart Rate (bpm): 136 Fundal Height: 34 cm Movement: Present     General:  Alert, oriented and cooperative. Patient is in no acute distress.  Skin: Skin is warm and dry. No rash noted.   Cardiovascular: Normal heart rate noted  Respiratory: Normal respiratory effort, no problems with respiration noted  Abdomen: Soft, gravid, appropriate for gestational age.  Pain/Pressure: Present     Pelvic: Cervical exam deferred        Extremities: Normal range of motion.  Edema: Trace  Mental Status: Normal mood and affect. Normal behavior. Normal judgment and thought content.   Assessment and Plan:  Pregnancy: [id number] at [redacted]w[redacted]d 1. Supervision of other normal pregnancy, antepartum Continue prenatal care.  - Prenatal Vit-Fe Fumarate-FA (PREPLUS) 27-1 MG TABS; Take 1 tablet by mouth daily.  Dispense: 30 tablet; Refill: 2  2. Gestational diabetes mellitus (GDM) affecting pregnancy, antepartum See babyScripts, add 30 minutes exercise daily, diet management--may need meds if no imrpovement. - [place] MFM OB DETAIL +14 WK;  Future  3. Supervision of high risk pregnancy, antepartum   Preterm labor symptoms and general obstetric precautions including but not limited to vaginal bleeding, contractions, leaking of fluid and fetal movement were reviewed in detail with the patient. Please refer to After Visit Summary for other counseling recommendations.   Return in 2 weeks (on 12/28/2018) for [facility], needs U/S MFM scheduled.  No future appointments.  Donnamae Jude, MD

## 2018-12-14 NOTE — Patient Instructions (Signed)

## 2018-12-29 ENCOUNTER — Ambulatory Visit (HOSPITAL_COMMUNITY)
Admission: RE | Admit: 2018-12-29 | Discharge: 2018-12-29 | Disposition: A | Discharge status: Home / Self Care | Payer: Medicaid Other | Source: Ambulatory Visit | Attending: Obstetrics and Gynecology | Admitting: Obstetrics and Gynecology

## 2018-12-29 ENCOUNTER — Other Ambulatory Visit: Payer: Self-pay

## 2018-12-29 ENCOUNTER — Encounter (HOSPITAL_COMMUNITY): Payer: Self-pay

## 2018-12-29 ENCOUNTER — Ambulatory Visit (INDEPENDENT_AMBULATORY_CARE_PROVIDER_SITE_OTHER): Payer: Medicaid Other | Admitting: Obstetrics and Gynecology

## 2018-12-29 ENCOUNTER — Ambulatory Visit (HOSPITAL_COMMUNITY): Payer: Medicaid Other | Admitting: *Deleted

## 2018-12-29 ENCOUNTER — Encounter: Payer: Self-pay | Admitting: Obstetrics and Gynecology

## 2018-12-29 VITALS — BP 128/78 | HR 108 | Temp 98.5°F | Wt 221.3 lb

## 2018-12-29 DIAGNOSIS — Z3A34 34 weeks gestation of pregnancy: Secondary | ICD-10-CM

## 2018-12-29 DIAGNOSIS — O0990 Supervision of high risk pregnancy, unspecified, unspecified trimester: Secondary | ICD-10-CM | POA: Diagnosis not present

## 2018-12-29 DIAGNOSIS — O24410 Gestational diabetes mellitus in pregnancy, diet controlled: Secondary | ICD-10-CM | POA: Diagnosis not present

## 2018-12-29 DIAGNOSIS — O09523 Supervision of elderly multigravida, third trimester: Secondary | ICD-10-CM | POA: Diagnosis not present

## 2018-12-29 DIAGNOSIS — Z98891 History of uterine scar from previous surgery: Secondary | ICD-10-CM

## 2018-12-29 DIAGNOSIS — O0993 Supervision of high risk pregnancy, unspecified, third trimester: Secondary | ICD-10-CM

## 2018-12-29 DIAGNOSIS — O23593 Infection of other part of genital tract in pregnancy, third trimester: Secondary | ICD-10-CM

## 2018-12-29 DIAGNOSIS — O24419 Gestational diabetes mellitus in pregnancy, unspecified control: Secondary | ICD-10-CM

## 2018-12-29 DIAGNOSIS — O34219 Maternal care for unspecified type scar from previous cesarean delivery: Secondary | ICD-10-CM | POA: Diagnosis not present

## 2018-12-29 DIAGNOSIS — A5901 Trichomonal vulvovaginitis: Secondary | ICD-10-CM

## 2018-12-29 NOTE — Progress Notes (Signed)
   PRENATAL VISIT NOTE  Subjective:  Colleen Wells is a 33 y.o. [id number] at [redacted]w[redacted]d being seen today for ongoing prenatal care.  She is currently monitored for the following issues for this high-risk pregnancy and has Supervision of high risk pregnancy, antepartum; History of C-section; Gestational diabetes mellitus (GDM) affecting pregnancy, antepartum; and Trichomonal vaginitis during pregnancy on their problem list.  Patient reports no complaints.  Contractions: Irritability. Vag. Bleeding: None.  Movement: Present. Denies leaking of fluid.   The following portions of the patient's history were reviewed and updated as appropriate: allergies, current medications, past family history, past medical history, past social history, past surgical history and problem list.   Objective:   Vitals:   12/29/18 1127  BP: 128/78  Pulse: (!) 108  Temp: 98.5 F (36.9 C)  Weight: 221 lb 4.8 oz (100.4 kg)    Fetal Status: Fetal Heart Rate (bpm): 151   Movement: Present     General:  Alert, oriented and cooperative. Patient is in no acute distress.  Skin: Skin is warm and dry. No rash noted.   Cardiovascular: Normal heart rate noted  Respiratory: Normal respiratory effort, no problems with respiration noted  Abdomen: Soft, gravid, appropriate for gestational age.  Pain/Pressure: Present     Pelvic: Cervical exam deferred        Extremities: Normal range of motion.  Edema: Trace  Mental Status: Normal mood and affect. Normal behavior. Normal judgment and thought content.   Assessment and Plan:  Pregnancy: [id number] at [redacted]w[redacted]d  1. Supervision of high risk pregnancy, antepartum Has [place] today at 1 pm  2. History of C-section Has scheduled CS 01/29/19  3. Trichomonal vaginitis during pregnancy in third trimester Neg TOC 11/29/18  4. Gestational diabetes mellitus (GDM) affecting pregnancy, antepartum All within range Cont diet control   Preterm labor symptoms and general obstetric precautions  including but not limited to vaginal bleeding, contractions, leaking of fluid and fetal movement were reviewed in detail with the patient. Please refer to After Visit Summary for other counseling recommendations.   Return in about 2 weeks (around 01/12/2019) for high OB, in person.  Future Appointments  Date Time Provider [place]  12/29/2018 12:50 PM [place] NURSE [place] [facility]  12/29/2018  1:00 PM [place] 3 [facility]    Sloan Leiter, MD

## 2018-12-29 NOTE — Progress Notes (Signed)
BRx Gluco Readings:

## 2019-01-13 ENCOUNTER — Encounter (HOSPITAL_COMMUNITY): Payer: Self-pay

## 2019-01-13 NOTE — Patient Instructions (Signed)
Colleen Wells  01/13/2019   Your procedure is scheduled on:  01/29/2019  Arrive at [place] at Entrance C on [address] at [facility]  and [address]. You are invited to use the FREE valet parking or use the Visitor's parking deck.  Pick up the phone at the desk and dial [phone].  Call this number if you have problems the morning of surgery: [phone]  Remember:   Do not eat food:(After Midnight) Desps de medianoche.  Do not drink clear liquids: (After Midnight) Desps de medianoche.  Take these medicines the morning of surgery with A SIP OF WATER:  none   Do not wear jewelry, make-up or nail polish.  Do not wear lotions, powders, or perfumes. Do not wear deodorant.  Do not shave 48 hours prior to surgery.  Do not bring valuables to the hospital.  [facility] is not   responsible for any belongings or valuables brought to the hospital.  Contacts, dentures or bridgework may not be worn into surgery.  Leave suitcase in the car. After surgery it may be brought to your room.  For patients admitted to the hospital, checkout time is 11:00 AM the day of              discharge.      Please read over the following fact sheets that you were given:     Preparing for Surgery

## 2019-01-16 ENCOUNTER — Other Ambulatory Visit (HOSPITAL_COMMUNITY)
Admission: RE | Admit: 2019-01-16 | Discharge: 2019-01-16 | Disposition: A | Discharge status: Home / Self Care | Payer: Medicaid Other | Source: Ambulatory Visit | Attending: Family Medicine | Admitting: Family Medicine

## 2019-01-16 ENCOUNTER — Ambulatory Visit (INDEPENDENT_AMBULATORY_CARE_PROVIDER_SITE_OTHER): Payer: Medicaid Other | Admitting: Family Medicine

## 2019-01-16 ENCOUNTER — Other Ambulatory Visit: Payer: Self-pay

## 2019-01-16 VITALS — BP 123/68 | HR 87 | Wt 222.0 lb

## 2019-01-16 DIAGNOSIS — A5901 Trichomonal vulvovaginitis: Secondary | ICD-10-CM

## 2019-01-16 DIAGNOSIS — O23593 Infection of other part of genital tract in pregnancy, third trimester: Secondary | ICD-10-CM

## 2019-01-16 DIAGNOSIS — O24419 Gestational diabetes mellitus in pregnancy, unspecified control: Secondary | ICD-10-CM

## 2019-01-16 DIAGNOSIS — O0990 Supervision of high risk pregnancy, unspecified, unspecified trimester: Secondary | ICD-10-CM | POA: Diagnosis not present

## 2019-01-16 DIAGNOSIS — Z3A37 37 weeks gestation of pregnancy: Secondary | ICD-10-CM

## 2019-01-16 DIAGNOSIS — Z148 Genetic carrier of other disease: Secondary | ICD-10-CM | POA: Insufficient documentation

## 2019-01-16 DIAGNOSIS — Z98891 History of uterine scar from previous surgery: Secondary | ICD-10-CM

## 2019-01-16 DIAGNOSIS — O0993 Supervision of high risk pregnancy, unspecified, third trimester: Secondary | ICD-10-CM

## 2019-01-16 HISTORY — DX: Genetic carrier of other disease: Z14.8

## 2019-01-16 NOTE — Progress Notes (Signed)
Subjective:  Colleen Wells is a 33 y.o. [id number] at [redacted]w[redacted]d being seen today for ongoing prenatal care.  She is currently monitored for the following issues for this high-risk pregnancy and has Supervision of high risk pregnancy, antepartum; History of C-section; Gestational diabetes mellitus (GDM) affecting pregnancy, antepartum; Trichomonal vaginitis during pregnancy; and Genetic carrier on their problem list.  GDM: Patient diet controlled.  Fasting: controlled 2hr PP: a few borderline values, mostly controlled  Patient reports no complaints.  Contractions: Irritability. Vag. Bleeding: None.  Movement: Present. Denies leaking of fluid.   The following portions of the patient's history were reviewed and updated as appropriate: allergies, current medications, past family history, past medical history, past social history, past surgical history and problem list. Problem list updated.  Objective:   Vitals:   01/16/19 1343  BP: 123/68  Pulse: 87  Weight: 222 lb (100.7 kg)    Fetal Status: Fetal Heart Rate (bpm): 148   Movement: Present     General:  Alert, oriented and cooperative. Patient is in no acute distress.  Skin: Skin is warm and dry. No rash noted.   Cardiovascular: Normal heart rate noted  Respiratory: Normal respiratory effort, no problems with respiration noted  Abdomen: Soft, gravid, appropriate for gestational age. Pain/Pressure: Present     Pelvic: Vag. Bleeding: None     Cervical exam deferred        Extremities: Normal range of motion.  Edema: Trace  Mental Status: Normal mood and affect. Normal behavior. Normal judgment and thought content.   Urinalysis:      Assessment and Plan:  Pregnancy: [id number] at [redacted]w[redacted]d  1. Supervision of high risk pregnancy, antepartum FHT and FH normal - Culture, beta strep (group b only) - Cervicovaginal ancillary only( [place])  2. Genetic carrier SMA reduced carrier risk. FOB testing done  3. History of C-section Plans repeat  12/6  4. Gestational diabetes mellitus (GDM) affecting pregnancy, antepartum Controlled  5. Trichomonal vaginitis during pregnancy in third trimester TOC neg  Term labor symptoms and general obstetric precautions including but not limited to vaginal bleeding, contractions, leaking of fluid and fetal movement were reviewed in detail with the patient. Please refer to After Visit Summary for other counseling recommendations.  No follow-ups on file.   Truett Mainland, DO

## 2019-01-17 LAB — CERVICOVAGINAL ANCILLARY ONLY
Chlamydia: NEGATIVE
Comment: NEGATIVE
Comment: NORMAL
Neisseria Gonorrhea: NEGATIVE

## 2019-01-18 ENCOUNTER — Encounter (HOSPITAL_COMMUNITY): Payer: Self-pay

## 2019-01-18 ENCOUNTER — Inpatient Hospital Stay (HOSPITAL_COMMUNITY)
Admission: AD | Admit: 2019-01-18 | Discharge: 2019-01-18 | Disposition: A | Discharge status: Home / Self Care | Payer: Medicaid Other | Attending: Obstetrics and Gynecology | Admitting: Obstetrics and Gynecology

## 2019-01-18 ENCOUNTER — Other Ambulatory Visit: Payer: Self-pay

## 2019-01-18 DIAGNOSIS — O26893 Other specified pregnancy related conditions, third trimester: Secondary | ICD-10-CM | POA: Insufficient documentation

## 2019-01-18 DIAGNOSIS — O0993 Supervision of high risk pregnancy, unspecified, third trimester: Secondary | ICD-10-CM | POA: Diagnosis not present

## 2019-01-18 DIAGNOSIS — O0990 Supervision of high risk pregnancy, unspecified, unspecified trimester: Secondary | ICD-10-CM

## 2019-01-18 DIAGNOSIS — O24419 Gestational diabetes mellitus in pregnancy, unspecified control: Secondary | ICD-10-CM | POA: Diagnosis not present

## 2019-01-18 DIAGNOSIS — N898 Other specified noninflammatory disorders of vagina: Secondary | ICD-10-CM | POA: Diagnosis not present

## 2019-01-18 DIAGNOSIS — Z3689 Encounter for other specified antenatal screening: Secondary | ICD-10-CM

## 2019-01-18 DIAGNOSIS — Z3A37 37 weeks gestation of pregnancy: Secondary | ICD-10-CM | POA: Insufficient documentation

## 2019-01-18 DIAGNOSIS — Z98891 History of uterine scar from previous surgery: Secondary | ICD-10-CM

## 2019-01-18 LAB — POCT FERN TEST: POCT Fern Test: NEGATIVE

## 2019-01-18 NOTE — MAU Provider Note (Signed)
History   [id number]   Chief Complaint  Patient presents with  . Rupture of Membranes    HPI Colleen Wells is a 33 y.o. female  [id number] @37 .4 wks here with report of leaking clear fluid around 11am.  Leaking of fluid has not continued. Pt reports no contractions. She denies vaginal bleeding. Last intercourse was not recent. She reports good fetal movement. All other systems negative.    Patient's last menstrual period was 04/30/2018 (exact date).  OB History  Gravida Para Term Preterm AB Living  6 4 4   1 4   SAB TAB Ectopic Multiple Live Births  1       1    # Outcome Date GA Lbr Len/2nd Weight Sex Delivery Anes PTL Lv  6 Current           5 Term 2014     CS-Unspec     4 Term 2012     CS-Unspec     3 SAB 2008          2 Term 2008     CS-Unspec     1 Term 2006     CS-Unspec   LIV    Past Medical History:  Diagnosis Date  . Arthrofibrosis of knee joint    right  . Gestational diabetes   . Medical history non-contributory   . Meniscus tear     History reviewed. No pertinent family history.  Social History   Socioeconomic History  . Marital status: Single    Spouse name: Not on file  . Number of children: 6  . Years of education: Not on file  . Highest education level: High school graduate  Occupational History  . Not on file  Social Needs  . Financial resource strain: Not hard at all  . Food insecurity    Worry: Never true    Inability: Never true  . Transportation needs    Medical: No    Non-medical: No  Tobacco Use  . Smoking status: Never Smoker  . Smokeless tobacco: Never Used  Substance and Sexual Activity  . Alcohol use: No  . Drug use: No  . Sexual activity: Yes  Lifestyle  . Physical activity    Days per week: Not on file    Minutes per session: Not on file  . Stress: Not at all  Relationships  . Social 2009 on phone: Not on file    Gets together: Not on file    Attends religious service: Not on file    Active  member of club or organization: Not on file    Attends meetings of clubs or organizations: Not on file    Relationship status: Never married  Other Topics Concern  . Not on file  Social History Narrative  . Not on file    No Known Allergies  No current facility-administered medications on file prior to encounter.    Current Outpatient Medications on File Prior to Encounter  Medication Sig Dispense Refill  . Prenatal Vit-Fe Fumarate-FA (PREPLUS) 27-1 MG TABS Take 1 tablet by mouth daily. 30 tablet 2  . Accu-Chek FastClix Lancets MISC 1 Device by Percutaneous route 4 (four) times daily. 100 each 12  . acetaminophen (TYLENOL) 500 MG tablet Take 1,000 mg by mouth every 6 (six) hours as needed for moderate pain or headache.    . glucose blood (ACCU-CHEK GUIDE) test strip Use as instructed 100 each 12     Review of Systems  Gastrointestinal: Negative for abdominal pain.  Genitourinary: Positive for vaginal discharge. Negative for vaginal bleeding.     Physical Exam   Vitals:   01/18/19 1255 01/18/19 1310 01/18/19 1312  BP: 122/69  128/77  Pulse: 87  87  Resp: 17    Temp: 98.3 F (36.8 C)    TempSrc: Oral    SpO2: 100% 100%   Weight: 100.1 kg    Height: 5\' 8"  (1.727 m)     Physical Exam  Constitutional: She is oriented to person, place, and time. She appears well-developed and well-nourished. No distress.  HENT:  Head: Normocephalic and atraumatic.  Neck: Normal range of motion.  Cardiovascular: Normal rate.  Respiratory: Effort normal. No respiratory distress.  Genitourinary:    Genitourinary Comments: SSE: no pool, fern neg   Musculoskeletal: Normal range of motion.  Neurological: She is alert and oriented to person, place, and time.  Skin: Skin is warm and dry.  Psychiatric: She has a normal mood and affect.  EFM: 145 bpm, mod variability, + accels, no decels Toco: rare  Results for orders placed or performed during the hospital encounter of 01/18/19 (from the  past 24 hour(s))  Fern Test     Status: Normal   Collection Time: 01/18/19  2:23 PM  Result Value Ref Range   POCT Fern Test Negative = intact amniotic membranes    MAU Course  Procedures  MDM Labs orderd and reviewed. No evidence of SROM. Stable for discharge home.   Assessment and Plan  [redacted] weeks gestation Reactive NST Vaginal discharge in pregnancy Discharge home Follow up at [facility] as scheduled Labor precautions  Allergies as of 01/18/2019   No Known Allergies     Medication List    TAKE these medications   Accu-Chek FastClix Lancets Misc 1 Device by Percutaneous route 4 (four) times daily.   Accu-Chek Guide test strip Generic drug: glucose blood Use as instructed   acetaminophen 500 MG tablet Commonly known as: TYLENOL Take 1,000 mg by mouth every 6 (six) hours as needed for moderate pain or headache.   PrePLUS 27-1 MG Tabs Take 1 tablet by mouth daily.      Julianne Handler, CNM 01/18/2019 2:03 PM

## 2019-01-18 NOTE — MAU Note (Signed)
Thinks she is leaking fluids, first noted at 1100. Clear and watery, not currently coming. No bleeding. No pain.

## 2019-01-18 NOTE — Discharge Instructions (Signed)
Braxton Hicks Contractions Contractions of the uterus can occur throughout pregnancy, but they are not always a sign that you are in labor. You may have practice contractions called Braxton Hicks contractions. These false labor contractions are sometimes confused with true labor. What are Braxton Hicks contractions? Braxton Hicks contractions are tightening movements that occur in the muscles of the uterus before labor. Unlike true labor contractions, these contractions do not result in opening (dilation) and thinning of the cervix. Toward the end of pregnancy (32-34 weeks), Braxton Hicks contractions can happen more often and may become stronger. These contractions are sometimes difficult to tell apart from true labor because they can be very uncomfortable. You should not feel embarrassed if you go to the hospital with false labor. Sometimes, the only way to tell if you are in true labor is for your health care provider to look for changes in the cervix. The health care provider will do a physical exam and may monitor your contractions. If you are not in true labor, the exam should show that your cervix is not dilating and your water has not broken. If there are no other health problems associated with your pregnancy, it is completely safe for you to be sent home with false labor. You may continue to have Braxton Hicks contractions until you go into true labor. How to tell the difference between true labor and false labor True labor  Contractions last 30-70 seconds.  Contractions become very regular.  Discomfort is usually felt in the top of the uterus, and it spreads to the lower abdomen and low back.  Contractions do not go away with walking.  Contractions usually become more intense and increase in frequency.  The cervix dilates and gets thinner. False labor  Contractions are usually shorter and not as strong as true labor contractions.  Contractions are usually irregular.  Contractions  are often felt in the front of the lower abdomen and in the groin.  Contractions may go away when you walk around or change positions while lying down.  Contractions get weaker and are shorter-lasting as time goes on.  The cervix usually does not dilate or become thin. Follow these instructions at home:   Take over-the-counter and prescription medicines only as told by your health care provider.  Keep up with your usual exercises and follow other instructions from your health care provider.  Eat and drink lightly if you think you are going into labor.  If Braxton Hicks contractions are making you uncomfortable: ? Change your position from lying down or resting to walking, or change from walking to resting. ? Sit and rest in a tub of warm water. ? Drink enough fluid to keep your urine pale yellow. Dehydration may cause these contractions. ? Do slow and deep breathing several times an hour.  Keep all follow-up prenatal visits as told by your health care provider. This is important. Contact a health care provider if:  You have a fever.  You have continuous pain in your abdomen. Get help right away if:  Your contractions become stronger, more regular, and closer together.  You have fluid leaking or gushing from your vagina.  You pass blood-tinged mucus (bloody show).  You have bleeding from your vagina.  You have low back pain that you never had before.  You feel your baby's head pushing down and causing pelvic pressure.  Your baby is not moving inside you as much as it used to. Summary  Contractions that occur before labor are   called Braxton Hicks contractions, false labor, or practice contractions.  Braxton Hicks contractions are usually shorter, weaker, farther apart, and less regular than true labor contractions. True labor contractions usually become progressively stronger and regular, and they become more frequent.  Manage discomfort from Braxton Hicks contractions  by changing position, resting in a warm bath, drinking plenty of water, or practicing deep breathing. This information is not intended to replace advice given to you by your health care provider. Make sure you discuss any questions you have with your health care provider. Document Released: 06/25/2016 Document Revised: 01/22/2017 Document Reviewed: 06/25/2016 Elsevier Patient Education  2020 [employer]  

## 2019-01-19 LAB — CULTURE, BETA STREP (GROUP B ONLY): Strep Gp B Culture: POSITIVE — AB

## 2019-01-25 ENCOUNTER — Ambulatory Visit (INDEPENDENT_AMBULATORY_CARE_PROVIDER_SITE_OTHER): Payer: Medicaid Other | Admitting: Obstetrics & Gynecology

## 2019-01-25 ENCOUNTER — Encounter: Payer: Self-pay | Admitting: Obstetrics & Gynecology

## 2019-01-25 ENCOUNTER — Other Ambulatory Visit: Payer: Self-pay

## 2019-01-25 VITALS — BP 131/71 | HR 98 | Wt 224.0 lb

## 2019-01-25 DIAGNOSIS — O24419 Gestational diabetes mellitus in pregnancy, unspecified control: Secondary | ICD-10-CM

## 2019-01-25 DIAGNOSIS — Z98891 History of uterine scar from previous surgery: Secondary | ICD-10-CM

## 2019-01-25 DIAGNOSIS — Z3A38 38 weeks gestation of pregnancy: Secondary | ICD-10-CM | POA: Diagnosis not present

## 2019-01-25 DIAGNOSIS — O0993 Supervision of high risk pregnancy, unspecified, third trimester: Secondary | ICD-10-CM

## 2019-01-25 DIAGNOSIS — O0990 Supervision of high risk pregnancy, unspecified, unspecified trimester: Secondary | ICD-10-CM

## 2019-01-25 NOTE — Progress Notes (Signed)
   PRENATAL VISIT NOTE  Subjective:  Colleen Wells is a 33 y.o. [id number] at [redacted]w[redacted]d being seen today for ongoing prenatal care.  She is currently monitored for the following issues for this high-risk pregnancy and has Supervision of high risk pregnancy, antepartum; History of C-section; Gestational diabetes mellitus (GDM) affecting pregnancy, antepartum; Trichomonal vaginitis during pregnancy; and Genetic carrier on their problem list.  Patient reports no complaints.  Contractions: Irritability. Vag. Bleeding: None.  Movement: Present. Denies leaking of fluid.   The following portions of the patient's history were reviewed and updated as appropriate: allergies, current medications, past family history, past medical history, past social history, past surgical history and problem list.   Objective:   Vitals:   01/25/19 1543  BP: 131/71  Pulse: 98  Weight: 224 lb (101.6 kg)    Fetal Status: Fetal Heart Rate (bpm): 145   Movement: Present     General:  Alert, oriented and cooperative. Patient is in no acute distress.  Skin: Skin is warm and dry. No rash noted.   Cardiovascular: Normal heart rate noted  Respiratory: Normal respiratory effort, no problems with respiration noted  Abdomen: Soft, gravid, appropriate for gestational age.  Pain/Pressure: Present     Pelvic: Cervical exam deferred        Extremities: Normal range of motion.  Edema: Trace  Mental Status: Normal mood and affect. Normal behavior. Normal judgment and thought content.   Assessment and Plan:  Pregnancy: A1P3790 at [redacted]w[redacted]d 1. Supervision of high risk pregnancy, antepartum   2. Gestational diabetes mellitus (GDM) affecting pregnancy, antepartum CBG reviewed in BS and all in range  3. History of C-section RCS 39 weeks  Term labor symptoms and general obstetric precautions including but not limited to vaginal bleeding, contractions, leaking of fluid and fetal movement were reviewed in detail with the patient.  Please refer to After Visit Summary for other counseling recommendations.   Return if symptoms worsen or fail to improve, for postpartum.  Future Appointments  Date Time Provider [place]  01/27/2019  8:30 AM [facility] 1 [facility] None    Emeterio Reeve, MD

## 2019-01-25 NOTE — Patient Instructions (Signed)
Cesarean Delivery Cesarean birth, or cesarean delivery, is the surgical delivery of a baby through an incision in the abdomen and the uterus. This may be referred to as a C-section. This procedure may be scheduled ahead of time, or it may be done in an emergency situation. Tell a health care provider about:  Any allergies you have.  All medicines you are taking, including vitamins, herbs, eye drops, creams, and over-the-counter medicines.  Any problems you or family members have had with anesthetic medicines.  Any blood disorders you have.  Any surgeries you have had.  Any medical conditions you have.  Whether you or any members of your family have a history of deep vein thrombosis (DVT) or pulmonary embolism (PE). What are the risks? Generally, this is a safe procedure. However, problems may occur, including:  Infection.  Bleeding.  Allergic reactions to medicines.  Damage to other structures or organs.  Blood clots.  Injury to your baby. What happens before the procedure? General instructions  Follow instructions from your health care provider about eating or drinking restrictions.  If you know that you are going to have a cesarean delivery, do not shave your pubic area. Shaving before the procedure may increase your risk of infection.  Plan to have someone take you home from the hospital.  Ask your health care provider what steps will be taken to prevent infection. These may include: ? Removing hair at the surgery site. ? Washing skin with a germ-killing soap. ? Taking antibiotic medicine.  Depending on the reason for your cesarean delivery, you may have a physical exam or additional testing, such as an ultrasound.  You may have your blood or urine tested. Questions for your health care provider  Ask your health care provider about: ? Changing or stopping your regular medicines. This is especially important if you are taking diabetes medicines or blood thinners.  ? Your pain management plan. This is especially important if you plan to breastfeed your baby. ? How long you will be in the hospital after the procedure. ? Any concerns you may have about receiving blood products, if you need them during the procedure. ? Cord blood banking, if you plan to collect your baby's umbilical cord blood.  You may also want to ask your health care provider: ? Whether you will be able to hold or breastfeed your baby while you are still in the operating room. ? Whether your baby can stay with you immediately after the procedure and during your recovery. ? Whether a family member or a person of your choice can go with you into the operating room and stay with you during the procedure, immediately after the procedure, and during your recovery. What happens during the procedure?   An IV will be inserted into one of your veins.  Fluid and medicines, such as antibiotics, will be given before the surgery.  Fetal monitors will be placed on your abdomen to check your baby's heart rate.  You may be given a special warming gown to wear to keep your temperature stable.  A catheter may be inserted into your bladder through your urethra. This drains your urine during the procedure.  You may be given one or more of the following: ? A medicine to numb the area (local anesthetic). ? A medicine to make you fall asleep (general anesthetic). ? A medicine (regional anesthetic) that is injected into your back or through a small thin tube placed in your back (spinal anesthetic or epidural anesthetic).   This numbs everything below the injection site and allows you to stay awake during your procedure. If this makes you feel nauseous, tell your health care provider. Medicines will be available to help reduce any nausea you may feel.  An incision will be made in your abdomen, and then in your uterus.  If you are awake during your procedure, you may feel tugging and pulling in your abdomen,  but you should not feel pain. If you feel pain, tell your health care provider immediately.  Your baby will be removed from your uterus. You may feel more pressure or pushing while this happens.  Immediately after birth, your baby will be dried and kept warm. You may be able to hold and breastfeed your baby.  The umbilical cord may be clamped and cut during this time. This usually occurs after waiting a period of 1-2 minutes after delivery.  Your placenta will be removed from your uterus.  Your incisions will be closed with stitches (sutures). Staples, skin glue, or adhesive strips may also be applied to the incision in your abdomen.  Bandages (dressings) may be placed over the incision in your abdomen. The procedure may vary among health care providers and hospitals. What happens after the procedure?  Your blood pressure, heart rate, breathing rate, and blood oxygen level will be monitored until you are discharged from the hospital.  You may continue to receive fluids and medicines through an IV.  You will have some pain. Medicines will be available to help control your pain.  To help prevent blood clots: ? You may be given medicines. ? You may have to wear compression stockings or devices. ? You will be encouraged to walk around when you are able.  Hospital staff will encourage and support bonding with your baby. Your hospital may have you and your baby to stay in the same room (rooming in) during your hospital stay to encourage successful bonding and breastfeeding.  You may be encouraged to cough and breathe deeply often. This helps to prevent lung problems.  If you have a catheter draining your urine, it will be removed as soon as possible after your procedure. Summary  Cesarean birth, or cesarean delivery, is the surgical delivery of a baby through an incision in the abdomen and the uterus.  Follow instructions from your health care provider about eating or drinking  restrictions before the procedure.  You will have some pain after the procedure. Medicines will be available to help control your pain.  Hospital staff will encourage and support bonding with your baby after the procedure. Your hospital may have you and your baby to stay in the same room (rooming in) during your hospital stay to encourage successful bonding and breastfeeding. This information is not intended to replace advice given to you by your health care provider. Make sure you discuss any questions you have with your health care provider. Document Released: 02/09/2005 Document Revised: 08/16/2017 Document Reviewed: 08/16/2017 Elsevier Patient Education  2020 [employer]  

## 2019-01-27 ENCOUNTER — Other Ambulatory Visit (HOSPITAL_COMMUNITY)
Admission: RE | Admit: 2019-01-27 | Discharge: 2019-01-27 | Disposition: A | Discharge status: Home / Self Care | Payer: Medicaid Other | Source: Ambulatory Visit | Attending: Obstetrics & Gynecology | Admitting: Obstetrics & Gynecology

## 2019-01-27 ENCOUNTER — Other Ambulatory Visit: Payer: Self-pay

## 2019-01-27 DIAGNOSIS — Z01812 Encounter for preprocedural laboratory examination: Secondary | ICD-10-CM | POA: Diagnosis not present

## 2019-01-27 DIAGNOSIS — U071 COVID-19: Secondary | ICD-10-CM | POA: Diagnosis not present

## 2019-01-27 LAB — CBC
HCT: 37 % (ref 36.0–46.0)
Hemoglobin: 11.9 g/dL — ABNORMAL LOW (ref 12.0–15.0)
MCH: 27.5 pg (ref 26.0–34.0)
MCHC: 32.2 g/dL (ref 30.0–36.0)
MCV: 85.5 fL (ref 80.0–100.0)
Platelets: 177 10*3/uL (ref 150–400)
RBC: 4.33 MIL/uL (ref 3.87–5.11)
RDW: 16.9 % — ABNORMAL HIGH (ref 11.5–15.5)
WBC: 7.6 10*3/uL (ref 4.0–10.5)
nRBC: 0 % (ref 0.0–0.2)

## 2019-01-27 LAB — SARS CORONAVIRUS 2 (TAT 6-24 HRS): SARS Coronavirus 2: POSITIVE — AB

## 2019-01-27 LAB — RPR: RPR Ser Ql: NONREACTIVE

## 2019-01-27 LAB — TYPE AND SCREEN
ABO/RH(D): O POS
Antibody Screen: NEGATIVE

## 2019-01-27 LAB — ABO/RH: ABO/RH(D): O POS

## 2019-01-27 NOTE — MAU Note (Signed)
covid swab collected.Pt tolerated well. Asymptomatic. Lab in to draw CBC/RPR/Type and screen

## 2019-01-29 ENCOUNTER — Encounter (HOSPITAL_COMMUNITY)
Admission: AD | Disposition: A | Discharge status: Home / Self Care | Payer: Self-pay | Source: Home / Self Care | Attending: Obstetrics & Gynecology

## 2019-01-29 ENCOUNTER — Inpatient Hospital Stay (HOSPITAL_COMMUNITY)
Admission: RE | Admit: 2019-01-29 | Payer: Medicaid Other | Source: Home / Self Care | Admitting: Obstetrics & Gynecology

## 2019-01-29 ENCOUNTER — Inpatient Hospital Stay (HOSPITAL_COMMUNITY)
Admission: AD | Admit: 2019-01-29 | Discharge: 2019-01-31 | DRG: 786 | Disposition: A | Discharge status: Home / Self Care | Payer: Medicaid Other | Attending: Obstetrics & Gynecology | Admitting: Obstetrics & Gynecology

## 2019-01-29 ENCOUNTER — Encounter (HOSPITAL_COMMUNITY): Payer: Self-pay

## 2019-01-29 ENCOUNTER — Inpatient Hospital Stay (HOSPITAL_COMMUNITY): Payer: Medicaid Other | Admitting: Certified Registered Nurse Anesthetist

## 2019-01-29 DIAGNOSIS — O0990 Supervision of high risk pregnancy, unspecified, unspecified trimester: Secondary | ICD-10-CM

## 2019-01-29 DIAGNOSIS — O34219 Maternal care for unspecified type scar from previous cesarean delivery: Secondary | ICD-10-CM | POA: Diagnosis not present

## 2019-01-29 DIAGNOSIS — O9852 Other viral diseases complicating childbirth: Secondary | ICD-10-CM | POA: Diagnosis not present

## 2019-01-29 DIAGNOSIS — O24420 Gestational diabetes mellitus in childbirth, diet controlled: Secondary | ICD-10-CM | POA: Diagnosis present

## 2019-01-29 DIAGNOSIS — Z3A00 Weeks of gestation of pregnancy not specified: Secondary | ICD-10-CM | POA: Diagnosis not present

## 2019-01-29 DIAGNOSIS — O99824 Streptococcus B carrier state complicating childbirth: Secondary | ICD-10-CM | POA: Diagnosis not present

## 2019-01-29 DIAGNOSIS — Z9889 Other specified postprocedural states: Secondary | ICD-10-CM

## 2019-01-29 DIAGNOSIS — O98513 Other viral diseases complicating pregnancy, third trimester: Secondary | ICD-10-CM | POA: Diagnosis present

## 2019-01-29 DIAGNOSIS — Z98891 History of uterine scar from previous surgery: Secondary | ICD-10-CM

## 2019-01-29 DIAGNOSIS — O24419 Gestational diabetes mellitus in pregnancy, unspecified control: Secondary | ICD-10-CM | POA: Diagnosis present

## 2019-01-29 DIAGNOSIS — O134 Gestational [pregnancy-induced] hypertension without significant proteinuria, complicating childbirth: Secondary | ICD-10-CM | POA: Diagnosis not present

## 2019-01-29 DIAGNOSIS — U071 COVID-19: Secondary | ICD-10-CM | POA: Diagnosis not present

## 2019-01-29 DIAGNOSIS — Z3A39 39 weeks gestation of pregnancy: Secondary | ICD-10-CM | POA: Diagnosis not present

## 2019-01-29 DIAGNOSIS — O34211 Maternal care for low transverse scar from previous cesarean delivery: Secondary | ICD-10-CM | POA: Diagnosis not present

## 2019-01-29 LAB — GLUCOSE, CAPILLARY: Glucose-Capillary: 96 mg/dL (ref 70–99)

## 2019-01-29 SURGERY — Surgical Case
Anesthesia: Spinal | Site: Abdomen | Wound class: Clean Contaminated

## 2019-01-29 MED ORDER — MEPERIDINE HCL 25 MG/ML IJ SOLN: 6.2500 mg | INTRAMUSCULAR | Status: DC | PRN

## 2019-01-29 MED ORDER — SODIUM CHLORIDE 0.9 % IV SOLN
INTRAVENOUS | Status: DC | PRN
  Administered 2019-01-29: 08:00:00 via INTRAVENOUS

## 2019-01-29 MED ORDER — FENTANYL CITRATE (PF) 100 MCG/2ML IJ SOLN
INTRAMUSCULAR | Status: DC | PRN
  Administered 2019-01-29: 15 ug via INTRATHECAL

## 2019-01-29 MED ORDER — STERILE WATER FOR IRRIGATION IR SOLN
Status: DC | PRN
  Administered 2019-01-29: 1000 mL

## 2019-01-29 MED ORDER — BUPIVACAINE IN DEXTROSE 0.75-8.25 % IT SOLN
INTRATHECAL | Status: DC | PRN
  Administered 2019-01-29: 1.6 mL via INTRATHECAL

## 2019-01-29 MED ORDER — ACETAMINOPHEN 500 MG PO TABS
1000.0000 mg | ORAL_TABLET | Freq: Four times a day (QID) | ORAL | Status: AC
  Administered 2019-01-29 (×3): 1000 mg via ORAL
  Filled 2019-01-29 (×3): qty 2

## 2019-01-29 MED ORDER — SIMETHICONE 80 MG PO CHEW
80.0000 mg | CHEWABLE_TABLET | Freq: Three times a day (TID) | ORAL | Status: DC
  Administered 2019-01-30 – 2019-01-31 (×3): 80 mg via ORAL
  Filled 2019-01-29 (×3): qty 1

## 2019-01-29 MED ORDER — BUPIVACAINE HCL (PF) 0.5 % IJ SOLN
INTRAMUSCULAR | Status: DC | PRN
  Administered 2019-01-29: 30 mL

## 2019-01-29 MED ORDER — SIMETHICONE 80 MG PO CHEW
80.0000 mg | CHEWABLE_TABLET | ORAL | Status: DC
  Administered 2019-01-30: 80 mg via ORAL
  Filled 2019-01-29 (×2): qty 1

## 2019-01-29 MED ORDER — SODIUM CHLORIDE 0.9% FLUSH: 3.0000 mL | INTRAVENOUS | Status: DC | PRN

## 2019-01-29 MED ORDER — OXYTOCIN 40 UNITS IN NORMAL SALINE INFUSION - SIMPLE MED
INTRAVENOUS | Status: DC | PRN
  Administered 2019-01-29: 40 [IU] via INTRAVENOUS

## 2019-01-29 MED ORDER — SENNOSIDES-DOCUSATE SODIUM 8.6-50 MG PO TABS
2.0000 | ORAL_TABLET | ORAL | Status: DC
  Administered 2019-01-29 – 2019-01-30 (×2): 2 via ORAL
  Filled 2019-01-29 (×2): qty 2

## 2019-01-29 MED ORDER — BUPIVACAINE HCL (PF) 0.5 % IJ SOLN
INTRAMUSCULAR | Status: AC
  Filled 2019-01-29: qty 30

## 2019-01-29 MED ORDER — TETANUS-DIPHTH-ACELL PERTUSSIS 5-2.5-18.5 LF-MCG/0.5 IM SUSP: 0.5000 mL | Freq: Once | INTRAMUSCULAR | Status: DC

## 2019-01-29 MED ORDER — KETOROLAC TROMETHAMINE 30 MG/ML IJ SOLN: 30.0000 mg | Freq: Once | INTRAMUSCULAR | Status: DC | PRN

## 2019-01-29 MED ORDER — NALBUPHINE HCL 10 MG/ML IJ SOLN: 5.0000 mg | INTRAMUSCULAR | Status: DC | PRN

## 2019-01-29 MED ORDER — COCONUT OIL OIL: 1.0000 "application " | TOPICAL_OIL | Status: DC | PRN

## 2019-01-29 MED ORDER — CEFAZOLIN SODIUM-DEXTROSE 2-4 GM/100ML-% IV SOLN
2.0000 g | INTRAVENOUS | Status: AC
  Administered 2019-01-29: 2 g via INTRAVENOUS

## 2019-01-29 MED ORDER — DIBUCAINE (PERIANAL) 1 % EX OINT: 1.0000 "application " | TOPICAL_OINTMENT | CUTANEOUS | Status: DC | PRN

## 2019-01-29 MED ORDER — ZOLPIDEM TARTRATE 5 MG PO TABS: 5.0000 mg | ORAL_TABLET | Freq: Every evening | ORAL | Status: DC | PRN

## 2019-01-29 MED ORDER — PRENATAL MULTIVITAMIN CH
1.0000 | ORAL_TABLET | Freq: Every day | ORAL | Status: DC
  Administered 2019-01-30: 1 via ORAL
  Filled 2019-01-29 (×2): qty 1

## 2019-01-29 MED ORDER — MORPHINE SULFATE (PF) 0.5 MG/ML IJ SOLN
INTRAMUSCULAR | Status: DC | PRN
  Administered 2019-01-29: .15 mg via INTRATHECAL

## 2019-01-29 MED ORDER — DIPHENHYDRAMINE HCL 25 MG PO CAPS: 25.0000 mg | ORAL_CAPSULE | Freq: Four times a day (QID) | ORAL | Status: DC | PRN

## 2019-01-29 MED ORDER — NALBUPHINE HCL 10 MG/ML IJ SOLN: 5.0000 mg | Freq: Once | INTRAMUSCULAR | Status: DC | PRN

## 2019-01-29 MED ORDER — OXYTOCIN 40 UNITS IN NORMAL SALINE INFUSION - SIMPLE MED: 2.5000 [IU]/h | INTRAVENOUS | Status: AC

## 2019-01-29 MED ORDER — OXYCODONE-ACETAMINOPHEN 5-325 MG PO TABS
1.0000 | ORAL_TABLET | ORAL | Status: DC | PRN
  Administered 2019-01-30: 2 via ORAL
  Administered 2019-01-30: 1 via ORAL
  Filled 2019-01-29 (×2): qty 1
  Filled 2019-01-29: qty 2

## 2019-01-29 MED ORDER — LACTATED RINGERS IV SOLN
INTRAVENOUS | Status: DC
  Administered 2019-01-29: 17:00:00 via INTRAVENOUS

## 2019-01-29 MED ORDER — HYDROMORPHONE HCL 1 MG/ML IJ SOLN: 0.2500 mg | INTRAMUSCULAR | Status: DC | PRN

## 2019-01-29 MED ORDER — LACTATED RINGERS IV SOLN
INTRAVENOUS | Status: DC
  Administered 2019-01-29 (×2): via INTRAVENOUS

## 2019-01-29 MED ORDER — ONDANSETRON HCL 4 MG/2ML IJ SOLN
INTRAMUSCULAR | Status: DC | PRN
  Administered 2019-01-29: 4 mg via INTRAVENOUS

## 2019-01-29 MED ORDER — IBUPROFEN 800 MG PO TABS
800.0000 mg | ORAL_TABLET | Freq: Three times a day (TID) | ORAL | Status: DC
  Administered 2019-01-29 – 2019-01-31 (×5): 800 mg via ORAL
  Filled 2019-01-29 (×5): qty 1

## 2019-01-29 MED ORDER — SIMETHICONE 80 MG PO CHEW: 80.0000 mg | CHEWABLE_TABLET | ORAL | Status: DC | PRN

## 2019-01-29 MED ORDER — INFLUENZA VAC SPLIT QUAD 0.5 ML IM SUSY
0.5000 mL | PREFILLED_SYRINGE | INTRAMUSCULAR | Status: DC
  Filled 2019-01-29: qty 0.5

## 2019-01-29 MED ORDER — PROMETHAZINE HCL 25 MG/ML IJ SOLN: 6.2500 mg | INTRAMUSCULAR | Status: DC | PRN

## 2019-01-29 MED ORDER — PHENYLEPHRINE HCL-NACL 20-0.9 MG/250ML-% IV SOLN
INTRAVENOUS | Status: DC | PRN
  Administered 2019-01-29: 60 ug/min via INTRAVENOUS

## 2019-01-29 MED ORDER — SODIUM CHLORIDE 0.9 % IR SOLN
Status: DC | PRN
  Administered 2019-01-29: 1000 mL

## 2019-01-29 MED ORDER — MEDROXYPROGESTERONE ACETATE 150 MG/ML IM SUSP
150.0000 mg | INTRAMUSCULAR | Status: AC | PRN
  Administered 2019-01-31: 150 mg via INTRAMUSCULAR
  Filled 2019-01-29: qty 1

## 2019-01-29 MED ORDER — CEFAZOLIN SODIUM-DEXTROSE 2-4 GM/100ML-% IV SOLN
INTRAVENOUS | Status: AC
  Filled 2019-01-29: qty 100

## 2019-01-29 MED ORDER — LACTATED RINGERS IV SOLN
INTRAVENOUS | Status: DC | PRN
  Administered 2019-01-29: 07:00:00 via INTRAVENOUS

## 2019-01-29 MED ORDER — SCOPOLAMINE 1 MG/3DAYS TD PT72: 1.0000 | MEDICATED_PATCH | Freq: Once | TRANSDERMAL | Status: DC

## 2019-01-29 MED ORDER — DIPHENHYDRAMINE HCL 25 MG PO CAPS: 25.0000 mg | ORAL_CAPSULE | ORAL | Status: DC | PRN

## 2019-01-29 MED ORDER — NALOXONE HCL 0.4 MG/ML IJ SOLN: 0.4000 mg | INTRAMUSCULAR | Status: DC | PRN

## 2019-01-29 MED ORDER — ONDANSETRON HCL 4 MG/2ML IJ SOLN: 4.0000 mg | Freq: Three times a day (TID) | INTRAMUSCULAR | Status: DC | PRN

## 2019-01-29 MED ORDER — MENTHOL 3 MG MT LOZG: 1.0000 | LOZENGE | OROMUCOSAL | Status: DC | PRN

## 2019-01-29 MED ORDER — WITCH HAZEL-GLYCERIN EX PADS: 1.0000 "application " | MEDICATED_PAD | CUTANEOUS | Status: DC | PRN

## 2019-01-29 MED ORDER — KETOROLAC TROMETHAMINE 30 MG/ML IJ SOLN
30.0000 mg | Freq: Four times a day (QID) | INTRAMUSCULAR | Status: AC | PRN
  Administered 2019-01-29: 30 mg via INTRAMUSCULAR

## 2019-01-29 MED ORDER — NALOXONE HCL 4 MG/10ML IJ SOLN
1.0000 ug/kg/h | INTRAVENOUS | Status: DC | PRN
  Filled 2019-01-29: qty 5

## 2019-01-29 MED ORDER — KETOROLAC TROMETHAMINE 30 MG/ML IJ SOLN
30.0000 mg | Freq: Four times a day (QID) | INTRAMUSCULAR | Status: AC | PRN
  Administered 2019-01-29: 30 mg via INTRAVENOUS
  Filled 2019-01-29: qty 1

## 2019-01-29 MED ORDER — DIPHENHYDRAMINE HCL 50 MG/ML IJ SOLN: 12.5000 mg | INTRAMUSCULAR | Status: DC | PRN

## 2019-01-29 SURGICAL SUPPLY — 26 items
CHLORAPREP W/TINT 26ML (MISCELLANEOUS) ×3 IMPLANT
CLAMP CORD UMBIL (MISCELLANEOUS) ×3 IMPLANT
DRSG OPSITE POSTOP 4X10 (GAUZE/BANDAGES/DRESSINGS) ×3 IMPLANT
ELECT REM PT RETURN 9FT ADLT (ELECTROSURGICAL) ×3
ELECTRODE REM PT RTRN 9FT ADLT (ELECTROSURGICAL) ×1 IMPLANT
GLOVE BIO SURGEON STRL SZ 6.5 (GLOVE) ×2 IMPLANT
GLOVE BIO SURGEONS STRL SZ 6.5 (GLOVE) ×1
GLOVE BIOGEL PI IND STRL 7.0 (GLOVE) ×1 IMPLANT
GLOVE BIOGEL PI INDICATOR 7.0 (GLOVE) ×2
GOWN STRL REUS W/ TWL LRG LVL3 (GOWN DISPOSABLE) ×2 IMPLANT
GOWN STRL REUS W/TWL LRG LVL3 (GOWN DISPOSABLE) ×4
NEEDLE SPNL 18GX3.5 QUINCKE PK (NEEDLE) ×3 IMPLANT
NS IRRIG 1000ML POUR BTL (IV SOLUTION) ×3 IMPLANT
PACK C SECTION WH (CUSTOM PROCEDURE TRAY) ×3 IMPLANT
PAD OB MATERNITY 4.3X12.25 (PERSONAL CARE ITEMS) ×3 IMPLANT
PENCIL SMOKE EVAC W/HOLSTER (ELECTROSURGICAL) ×3 IMPLANT
SPONGE LAP 18X18 RF (DISPOSABLE) ×9 IMPLANT
SUT PDS AB 0 CTX 60 (SUTURE) ×3 IMPLANT
SUT VIC AB 2-0 CT1 27 (SUTURE) ×2
SUT VIC AB 2-0 CT1 TAPERPNT 27 (SUTURE) ×1 IMPLANT
SUT VIC AB 2-0 CTX 36 (SUTURE) ×6 IMPLANT
SUT VIC AB 3-0 CT1 27 (SUTURE) ×2
SUT VIC AB 3-0 CT1 TAPERPNT 27 (SUTURE) ×1 IMPLANT
SYR 30ML LL (SYRINGE) ×3 IMPLANT
TOWEL OR 17X24 6PK STRL BLUE (TOWEL DISPOSABLE) ×3 IMPLANT
TRAY FOLEY BAG SILVER LF 14FR (SET/KITS/TRAYS/PACK) ×3 IMPLANT

## 2019-01-29 NOTE — Anesthesia Postprocedure Evaluation (Signed)
Anesthesia Post Note  Patient: Colleen Wells  Procedure(s) Performed: CESAREAN SECTION (N/A Abdomen)     Patient location during evaluation: PACU Anesthesia Type: Spinal Level of consciousness: awake Pain management: pain level controlled Vital Signs Assessment: post-procedure vital signs reviewed and stable Respiratory status: spontaneous breathing Cardiovascular status: stable Postop Assessment: no headache, no backache, spinal receding, patient able to bend at knees and no apparent nausea or vomiting Anesthetic complications: no    Last Vitals:  Vitals:   01/29/19 1000 01/29/19 1001  BP: 112/81 112/81  Pulse: 72 67  Resp: 17 17  Temp:    SpO2: 99% 100%    Last Pain:  Vitals:   01/29/19 0931  TempSrc: Oral  PainSc: 0-No pain   Pain Goal:                Epidural/Spinal Function Cutaneous sensation: Tingles (01/29/19 1000), Patient able to flex knees: Yes (01/29/19 1000), Patient able to lift hips off bed: No (01/29/19 1000), Back pain beyond tenderness at insertion site: No (01/29/19 1000), Progressively worsening motor and/or sensory loss: No (01/29/19 1000)  Huston Foley

## 2019-01-29 NOTE — Anesthesia Procedure Notes (Signed)
Spinal  Patient location during procedure: OR Start time: 01/29/2019 7:31 AM End time: 01/29/2019 7:34 AM Staffing Anesthesiologist: Lyn Hollingshead, MD Performed: anesthesiologist  Preanesthetic Checklist Completed: patient identified, site marked, surgical consent, pre-op evaluation, timeout performed, IV checked, risks and benefits discussed and monitors and equipment checked Spinal Block Patient position: sitting Prep: site prepped and draped and DuraPrep Patient monitoring: continuous pulse ox and blood pressure Approach: midline Location: L3-4 Injection technique: single-shot Needle Needle type: Pencan  Needle gauge: 24 G Needle length: 10 cm Needle insertion depth: 5 cm Assessment Sensory level: T4

## 2019-01-29 NOTE — Transfer of Care (Signed)
Immediate Anesthesia Transfer of Care Note  Patient: Colleen Wells  Procedure(s) Performed: CESAREAN SECTION (N/A Abdomen)  Patient Location: PACU  Anesthesia Type:Spinal  Level of Consciousness: awake and alert   Airway & Oxygen Therapy: Patient Spontanous Breathing  Post-op Assessment: Report given to RN and Post -op Vital signs reviewed and stable  Post vital signs: Reviewed  Last Vitals:  Vitals Value Taken Time  BP 112/81 01/29/19 1001  Temp 36.4 C 01/29/19 0931  Pulse 74 01/29/19 1003  Resp 19 01/29/19 1004  SpO2 95 % 01/29/19 1003  Vitals shown include unvalidated device data.  Last Pain:  Vitals:   01/29/19 0931  TempSrc: Oral  PainSc: 0-No pain         Complications: No apparent anesthesia complications

## 2019-01-29 NOTE — Discharge Instructions (Signed)
Postpartum Care After Cesarean Delivery °This sheet gives you information about how to care for yourself from the time you deliver your baby to up to 6-12 weeks after delivery (postpartum period). Your health care provider may also give you more specific instructions. If you have problems or questions, contact your health care provider. °Follow these instructions at home: °Medicines °· Take over-the-counter and prescription medicines only as told by your health care provider. °· If you were prescribed an antibiotic medicine, take it as told by your health care provider. Do not stop taking the antibiotic even if you start to feel better. °· Ask your health care provider if the medicine prescribed to you: °? Requires you to avoid driving or using heavy machinery. °? Can cause constipation. You may need to take actions to prevent or treat constipation, such as: °§ Drink enough fluid to keep your urine pale yellow. °§ Take over-the-counter or prescription medicines. °§ Eat foods that are high in fiber, such as beans, whole grains, and fresh fruits and vegetables. °§ Limit foods that are high in fat and processed sugars, such as fried or sweet foods. °Activity °· Gradually return to your normal activities as told by your health care provider. °· Avoid activities that take a lot of effort and energy (are strenuous) until approved by your health care provider. Walking at a slow to moderate pace is usually safe. Ask your health care provider what activities are safe for you. °? Do not lift anything that is heavier than your baby or 10 lb (4.5 kg) as told by your health care provider. °? Do not vacuum, climb stairs, or drive a car for as long as told by your health care provider. °· If possible, have someone help you at home until you are able to do your usual activities yourself. °· Rest as much as possible. Try to rest or take naps while your baby is sleeping. °Vaginal bleeding °· It is normal to have vaginal bleeding  (lochia) after delivery. Wear a sanitary pad to absorb vaginal bleeding and discharge. °? During the first week after delivery, the amount and appearance of lochia is often similar to a menstrual period. °? Over the next few weeks, it will gradually decrease to a dry, yellow-brown discharge. °? For most women, lochia stops completely by 4-6 weeks after delivery. Vaginal bleeding can vary from woman to woman. °· Change your sanitary pads frequently. Watch for any changes in your flow, such as: °? A sudden increase in volume. °? A change in color. °? Large blood clots. °· If you pass a blood clot, save it and call your health care provider to discuss. Do not flush blood clots down the toilet before you get instructions from your health care provider. °· Do not use tampons or douches until your health care provider says this is safe. °· If you are not breastfeeding, your period should return 6-8 weeks after delivery. If you are breastfeeding, your period may return anytime between 8 weeks after delivery and the time that you stop breastfeeding. °Perineal care ° °· If your C-section (Cesarean section) was unplanned, and you were allowed to labor and push before delivery, you may have pain, swelling, and discomfort of the tissue between your vaginal opening and your anus (perineum). You may also have an incision in the tissue (episiotomy) or the tissue may have torn during delivery. Follow these instructions as told by your health care provider: °? Keep your perineum clean and dry as told by   your health care provider. Use medicated pads and pain-relieving sprays and creams as directed. °? If you have an episiotomy or vaginal tear, check the area every day for signs of infection. Check for: °§ Redness, swelling, or pain. °§ Fluid or blood. °§ Warmth. °§ Pus or a bad smell. °? You may be given a squirt bottle to use instead of wiping to clean the perineum area after you go to the bathroom. As you start healing, you may use  the squirt bottle before wiping yourself. Make sure to wipe gently. °? To relieve pain caused by an episiotomy, vaginal tear, or hemorrhoids, try taking a warm sitz bath 2-3 times a day. A sitz bath is a warm water bath that is taken while you are sitting down. The water should only come up to your hips and should cover your buttocks. °Breast care °· Within the first few days after delivery, your breasts may feel heavy, full, and uncomfortable (breast engorgement). You may also have milk leaking from your breasts. Your health care provider can suggest ways to help relieve breast discomfort. Breast engorgement should go away within a few days. °· If you are breastfeeding: °? Wear a bra that supports your breasts and fits you well. °? Keep your nipples clean and dry. Apply creams and ointments as told by your health care provider. °? You may need to use breast pads to absorb milk leakage. °? You may have uterine contractions every time you breastfeed for several weeks after delivery. Uterine contractions help your uterus return to its normal size. °? If you have any problems with breastfeeding, work with your health care provider or a lactation consultant. °· If you are not breastfeeding: °? Avoid touching your breasts as this can make your breasts produce more milk. °? Wear a well-fitting bra and use cold packs to help with swelling. °? Do not squeeze out (express) milk. This causes you to make more milk. °Intimacy and sexuality °· Ask your health care provider when you can engage in sexual activity. This may depend on your: °? Risk of infection. °? Healing rate. °? Comfort and desire to engage in sexual activity. °· You are able to get pregnant after delivery, even if you have not had your period. If desired, talk with your health care provider about methods of family planning or birth control (contraception). °Lifestyle °· Do not use any products that contain nicotine or tobacco, such as cigarettes, e-cigarettes,  and chewing tobacco. If you need help quitting, ask your health care provider. °· Do not drink alcohol, especially if you are breastfeeding. °Eating and drinking ° °· Drink enough fluid to keep your urine pale yellow. °· Eat high-fiber foods every day. These may help prevent or relieve constipation. High-fiber foods include: °? Whole grain cereals and breads. °? Brown rice. °? Beans. °? Fresh fruits and vegetables. °· Take your prenatal vitamins until your postpartum checkup or until your health care provider tells you it is okay to stop. °General instructions °· Keep all follow-up visits for you and your baby as told by your health care provider. Most women visit their health care provider for a postpartum checkup within the first 3-6 weeks after delivery. °Contact a health care provider if you: °· Feel unable to cope with the changes that a new baby brings to your life, and these feelings do not go away. °· Feel unusually sad or worried. °· Have breasts that are painful, hard, or turn red. °· Have a fever. °·   Have trouble holding urine or keeping urine from leaking. °· Have little or no interest in activities you used to enjoy. °· Have not breastfed at all and you have not had a menstrual period for 12 weeks after delivery. °· Have stopped breastfeeding and you have not had a menstrual period for 12 weeks after you stopped breastfeeding. °· Have questions about caring for yourself or your baby. °· Pass a blood clot from your vagina. °Get help right away if you: °· Have chest pain. °· Have difficulty breathing. °· Have sudden, severe leg pain. °· Have severe pain or cramping in your abdomen. °· Bleed from your vagina so much that you fill more than one sanitary pad in one hour. Bleeding should not be heavier than your heaviest period. °· Develop a severe headache. °· Faint. °· Have blurred vision or spots in your vision. °· Have a bad-smelling vaginal discharge. °· Have thoughts about hurting yourself or your  baby. °If you ever feel like you may hurt yourself or others, or have thoughts about taking your own life, get help right away. You can go to your nearest emergency department or call: °· Your local emergency services (911 in the [place] °· A suicide crisis helpline, such as the National Suicide Prevention Lifeline at 1-800-273-8255. This is open 24 hours a day. °Summary °· The period of time from when you deliver your baby to up to 6-12 weeks after delivery is called the postpartum period. °· Gradually return to your normal activities as told by your health care provider. °· Keep all follow-up visits for you and your baby as told by your health care provider. °This information is not intended to replace advice given to you by your health care provider. Make sure you discuss any questions you have with your health care provider. °Document Released: 02/07/2000 Document Revised: 09/29/2017 Document Reviewed: 09/29/2017 °Elsevier Patient Education © 2020 [employer] Inc. ° °

## 2019-01-29 NOTE — Anesthesia Preprocedure Evaluation (Signed)
Anesthesia Evaluation  Patient identified by MRN, date of birth, ID band Patient awake    Reviewed: Allergy & Precautions, H&P , NPO status , Patient's Chart, lab work & pertinent test results  Airway Mallampati: II  TM Distance: >3 FB Neck ROM: full    Dental no notable dental hx. (+) Teeth Intact   Pulmonary neg pulmonary ROS,    Pulmonary exam normal breath sounds clear to auscultation       Cardiovascular negative cardio ROS Normal cardiovascular exam Rhythm:regular Rate:Normal     Neuro/Psych negative neurological ROS  negative psych ROS   GI/Hepatic negative GI ROS, Neg liver ROS,   Endo/Other  negative endocrine ROSdiabetes  Renal/GU negative Renal ROS  negative genitourinary   Musculoskeletal   Abdominal Normal abdominal exam  (+)   Peds  Hematology negative hematology ROS (+)   Anesthesia Other Findings   Reproductive/Obstetrics (+) Pregnancy                             Anesthesia Physical Anesthesia Plan  ASA: II  Anesthesia Plan: Spinal   Post-op Pain Management:    Induction:   PONV Risk Score and Plan: 3 and Ondansetron, Dexamethasone and Scopolamine patch - Pre-op  Airway Management Planned: Natural Airway, Nasal Cannula and Simple Face Mask  Additional Equipment: None  Intra-op Plan:   Post-operative Plan:   Informed Consent: I have reviewed the patients History and Physical, chart, labs and discussed the procedure including the risks, benefits and alternatives for the proposed anesthesia with the patient or authorized representative who has indicated his/her understanding and acceptance.       Plan Discussed with: CRNA  Anesthesia Plan Comments:         Anesthesia Quick Evaluation

## 2019-01-29 NOTE — H&P (Signed)
Colleen Wells is a 33 y.o. female presenting for  Repeat c/s (#5). She declines a BTL. She has GDM and gestational hypertension.  OB History    Gravida  6   Para  4   Term  4   Preterm      AB  1   Living  4     SAB  1   TAB      Ectopic      Multiple      Live Births  1          Past Medical History:  Diagnosis Date  . Arthrofibrosis of knee joint    right  . Gestational diabetes   . Medical history non-contributory   . Meniscus tear    Past Surgical History:  Procedure Laterality Date  . ANTERIOR CRUCIATE LIGAMENT REPAIR Right 09/03/2015   Procedure: REPAIR ANTERIOR CRUCIATE LIGAMENT  REPAIR OF AVULSION;  Surgeon: Meredith Pel, MD;  Location: [facility];  Service: Orthopedics;  Laterality: Right;  . CESAREAN SECTION     x4  . KNEE CLOSED REDUCTION Right 10/08/2015   Procedure: CLOSED MANIPULATION KNEE UNDER ANESTHESIA;  Surgeon: Meredith Pel, MD;  Location: [facility];  Service: Orthopedics;  Laterality: Right;  . MENISCUS REPAIR Right 09/03/2015   Procedure: REPAIR OF MENISCUS LATERAL;  Surgeon: Meredith Pel, MD;  Location: [facility];  Service: Orthopedics;  Laterality: Right;   Family History: family history is not on file. Social History:  reports that she has never smoked. She has never used smokeless tobacco. She reports that she does not drink alcohol or use drugs.     Maternal Diabetes: Yes:  Diabetes Type:  Diet controlled Genetic Screening: Normal Maternal Ultrasounds/Referrals: Normal Fetal Ultrasounds or other Referrals:  none Maternal Substance Abuse:  No Significant Maternal Medications:  None Significant Maternal Lab Results:  None Other Comments:  None  ROS History   Blood pressure 131/77, pulse 89, temperature 98.1 F (36.7 C), temperature source Oral, resp. rate 18, height 5\' 8"  (1.727 m), weight 101.6 kg, last menstrual period 04/30/2018, SpO2 100 %. Exam Physical Exam  Breathing, conversing, and ambulating normally Well  nourished, well hydrated Black female, no apparent distress Heart- rrr Lungs- CTAB Abd- benign, gravid  Prenatal labs: ABO, Rh: --/--/O POS, O POS Performed at [facility], [address] [place], [place] [address]  [phone]) Antibody: NEG (12/04 3154) Rubella: Immune (05/21 0000) RPR: NON REACTIVE (12/04 0086)  HBsAg: Negative (05/21 0000)  HIV: Non-reactive (09/21 0000)  GBS: Positive/-- (11/23 1632)   Assessment/Plan: Repeat c/s x 5  She understands the risks of surgery, including, but not to infection, bleeding, DVTs, damage to bowel, bladder, ureters. She wishes to proceed.   She plans depo provera initiallly, and then perhaps IUD.   [name] 01/29/2019, 6:57 AM

## 2019-01-29 NOTE — Op Note (Signed)
01/29/2019  8:11 AM  PATIENT:  Colleen Wells  33 y.o. female  PRE-OPERATIVE DIAGNOSIS:  Previous Cesarean Section x 4, GDM, HTN, COVID +  POST-OPERATIVE DIAGNOSIS:  same PROCEDURE:  Procedure(s): CESAREAN SECTION (N/A)  SURGEON:  Surgeon(s) and Role:    * Shaianne Nucci, Wilhemina Cash, MD - Primary    * Mathis Dad, MD   ANESTHESIA:   local and spinal  EBL:  Less than 500 cc   BLOOD ADMINISTERED:none  DRAINS: Urinary Catheter (Foley)   LOCAL MEDICATIONS USED:  MARCAINE     SPECIMEN:  Source of Specimen:  cord blood  DISPOSITION OF SPECIMEN:  PATHOLOGY  COUNTS:  YES  TOURNIQUET:  * No tourniquets in log *  DICTATION: .Dragon Dictation  PLAN OF CARE: Admit to inpatient   PATIENT DISPOSITION:  PACU - hemodynamically stable.    The risks, benefits, and alternatives of surgery were explained, understood, accepted. Consents were signed. All questions were answered. In the operating room spinal anesthesia was applied without complication. Her abdomen and vagina were prepped and draped in the usual sterile fashion. A Foley catheter was placed, draining clear urine throughout case. Timeout procedure was done. After adequate anesthesia was assured 30 mL for 0.5% Marcaine was injected into the subcutaneous tissue at the site of her previous cesarean. An incision was made through the previous incision. The incision was carried down through the subcutaneous tissue to the fascia. The fascia was scored the midline and extended bilaterally. The rectus muscles were very attenuated essentially were completely separated. The Bovie was used to make a transverse incision where the rectus muscles should have been. Excellent hemostasis was maintained. The peritoneum was entered with hemostats. Peritoneal incision was extended bilaterally with the Bovie. The bladder blade was placed. A transverse incision was made on the well-developed lower uterine segment. The uterine incision was extended with bandage  scissors on each side. Amniotomy was performed with a hemostat. Clear fluid was noted. The baby was delivered from a vertex presentation.the mouth and nostrils were suctioned prior to delivery of the shoulders.  The baby's cord was clamped and cut and was transferred to the NICU personnel for routine care after a routine 1 minute delay in cord clamping.  The placenta was delivered intact with traction. The uterus was left in situ and the interior was cleaned with a dry lap sponge. The uterine incision was closed with 2-0 Vicryl running locking suture. Excellent hemostasis was noted. By tilting the uterus each side was able to visualize the adnexa, and they were normal. The rectus fascia rectus muscles were noted be hemostatic as well. The fascia was closed with a #1 PDS loop in a running nonlocking fashion. No defects were palpable. The subcutaneous tissue was irrigated, clean, and dried. A subcuticular closure was done with a 3-0 Vicryl suture. Steri-Strips are placed. Excellent cosmetic results were obtained. She was taken to the recovery room in stable condition. She tolerated the procedure well.

## 2019-01-29 NOTE — Discharge Summary (Addendum)
Postpartum Discharge Summary      Patient Name: Colleen Wells DOB: 23-Feb-1986 MRN: [id number]  Date of admission: 01/29/2019 Delivering Provider: [name]   Date of discharge: 01/31/2019  Admitting diagnosis: c-section Intrauterine pregnancy: [redacted]w[redacted]d    Secondary diagnosis:  Active Problems:   Supervision of high risk pregnancy, antepartum   History of C-section   Gestational diabetes mellitus (GDM) affecting pregnancy, antepartum   Post-operative state   COVID-19 affecting pregnancy in third trimester  Additional problems: Repeat Elective Cesarean section, A1GDM     Discharge diagnosis: Term Pregnancy Delivered and GDM A1                                                                          Post partum procedures: None  Augmentation: None  Complications: None  Hospital course:  Sceduled C/S   33y.o. yo [id number] admitted to the hospital 01/29/2019 for scheduled cesarean section with the following indication:Elective Repeat.  Membrane Rupture Time/Date: 7:50 AM ,01/29/2019   Patient delivered a Viable infant.01/29/2019  Details of operation can be found in separate operative note.  Pateint had an uncomplicated postpartum course. Amlodipine 2.5 mg initiated as pressure gradually trending up but still in normal range; patient with gHTN She is ambulating, tolerating a regular diet, passing flatus, and urinating well. Patient is discharged home in stable condition on  01/31/19        Delivery time: 7:52 AM    Magnesium Sulfate received: No BMZ received: No Rhophylac:N/A MMR:N/A Transfusion:No  Physical exam  Vitals:   01/30/19 0750 01/30/19 1709 01/30/19 2053 01/31/19 0522  BP: 118/74 133/86 138/77 133/80  Pulse: 73 79 70 89  Resp: 18 18 18 18   Temp: 98.2 F (36.8 C) 98.9 F (37.2 C) 98 F (36.7 C) 97.8 F (36.6 C)  TempSrc: Oral Oral Oral Oral  SpO2: 100% 100% 99% 99%  Weight:      Height:       General: alert, cooperative and no  distress Lochia: appropriate Uterine Fundus: firm Incision: Dressing is clean, dry, and intact DVT Evaluation: No evidence of DVT seen on physical exam. Negative Homan's sign. No cords or calf tenderness. No significant calf/ankle edema. Labs: Lab Results  Component Value Date   WBC 7.0 01/30/2019   HGB 11.0 (L) 01/30/2019   HCT 34.1 (L) 01/30/2019   MCV 85.7 01/30/2019   PLT 158 01/30/2019   CMP Latest Ref Rng & Units 11/16/2018  Glucose 70 - 99 100(A)  BUN 6 - 20 mg/dL -  Creatinine 0.44 - 1.00 mg/dL -  Sodium 135 - 145 mmol/L -  Potassium 3.5 - 5.1 mmol/L -  Chloride 98 - 111 mmol/L -  CO2 22 - 32 mmol/L -  Calcium 8.9 - 10.3 mg/dL -  Total Protein 6.5 - 8.1 g/dL -  Total Bilirubin 0.3 - 1.2 mg/dL -  Alkaline Phos 38 - 126 U/L -  AST 15 - 41 U/L -  ALT 0 - 44 U/L -    Discharge instruction: per After Visit Summary and "Baby and Me Booklet".  After visit meds:  Allergies as of 01/31/2019   No Known Allergies     Medication List  TAKE these medications   Accu-Chek FastClix Lancets Misc 1 Device by Percutaneous route 4 (four) times daily.   Accu-Chek Guide test strip Generic drug: glucose blood Use as instructed   amLODipine 2.5 MG tablet Commonly known as: NORVASC Take 1 tablet (2.5 mg total) by mouth daily.   ibuprofen 800 MG tablet Commonly known as: ADVIL Take 1 tablet (800 mg total) by mouth every 8 (eight) hours.   oxyCODONE-acetaminophen 5-325 MG tablet Commonly known as: PERCOCET/ROXICET Take 1-2 tablets by mouth every 4 (four) hours as needed for moderate pain.   PrePLUS 27-1 MG Tabs Take 1 tablet by mouth daily.       Diet: routine diet  Activity: Advance as tolerated. Pelvic rest for 6 weeks.   Outpatient follow up:4 weeks Follow up Appt: Future Appointments  Date Time Provider [place]  02/14/2019 10:00 AM [place]  02/27/2019  3:35 PM Burleson, Rona Ravens, NP [facility]   Follow up Visit: Please  schedule this patient for Postpartum visit in: 4 weeks with the following provider: Any provider For C/S patients schedule nurse incision check in weeks 2 weeks: yes High risk pregnancy complicated by: [email or web address] Delivery mode:  CS Anticipated Birth Control:  Depo PP Procedures needed: Incision check , BP check Schedule Integrated BH visit: no  Newborn Data: Live born female  Birth Weight: 3830 g APGAR: 8,9   Newborn Delivery   Birth date/time: 01/29/2019 07:52:00 Delivery type: C-Section, Low Transverse Trial of labor: No C-section categorization: Repeat      Baby Feeding: Bottle Disposition:home with mother   01/31/2019 Chauncey Mann, MD

## 2019-01-30 ENCOUNTER — Encounter (HOSPITAL_COMMUNITY): Payer: Self-pay | Admitting: *Deleted

## 2019-01-30 DIAGNOSIS — O98513 Other viral diseases complicating pregnancy, third trimester: Secondary | ICD-10-CM | POA: Diagnosis present

## 2019-01-30 DIAGNOSIS — U071 COVID-19: Secondary | ICD-10-CM | POA: Diagnosis present

## 2019-01-30 LAB — CBC
HCT: 34.1 % — ABNORMAL LOW (ref 36.0–46.0)
Hemoglobin: 11 g/dL — ABNORMAL LOW (ref 12.0–15.0)
MCH: 27.6 pg (ref 26.0–34.0)
MCHC: 32.3 g/dL (ref 30.0–36.0)
MCV: 85.7 fL (ref 80.0–100.0)
Platelets: 158 10*3/uL (ref 150–400)
RBC: 3.98 MIL/uL (ref 3.87–5.11)
RDW: 16.8 % — ABNORMAL HIGH (ref 11.5–15.5)
WBC: 7 10*3/uL (ref 4.0–10.5)
nRBC: 0 % (ref 0.0–0.2)

## 2019-01-30 LAB — GLUCOSE, CAPILLARY: Glucose-Capillary: 104 mg/dL — ABNORMAL HIGH (ref 70–99)

## 2019-01-30 NOTE — Progress Notes (Signed)
POSTPARTUM PROGRESS NOTE  Subjective: Colleen Wells is a 33 y.o. [id number] s/p 5th rLTCS at [redacted]w[redacted]d.  She reports she doing well. No acute events overnight. She denies any problems with ambulating, voiding or po intake. Denies nausea or vomiting. She has passed flatus. Pain is well controlled.  Lochia is appropriate.  Objective: Blood pressure 100/68, pulse 72, temperature 98.2 F (36.8 C), temperature source Oral, resp. rate 18, height 5\' 8"  (1.727 m), weight 101.6 kg, last menstrual period 04/30/2018, SpO2 100 %, unknown if currently breastfeeding.  Physical Exam:  General: alert, cooperative and no distress Chest: no respiratory distress Abdomen: soft, non-tender  Uterine Fundus: firm, appropriately tender Extremities: No calf swelling or tenderness  no edema  Recent Labs    01/27/19 0822  HGB 11.9*  HCT 37.0    Assessment/Plan: Colleen Wells is a 33 y.o. [id number] s/p 5th rLTCS at [redacted]w[redacted]d.  Routine Postpartum Care: Doing well, pain well-controlled.  -- Continue routine care -- Contraception: Depo -- Feeding: bottle  Dispo: Plan for discharge tomorrow.  Merilyn Baba, DO OB/GYN Fellow, [facility]

## 2019-01-31 MED ORDER — IBUPROFEN 800 MG PO TABS: 800.0000 mg | ORAL_TABLET | Freq: Three times a day (TID) | ORAL | 0 refills | Status: DC

## 2019-01-31 MED ORDER — OXYCODONE-ACETAMINOPHEN 5-325 MG PO TABS: 1.0000 | ORAL_TABLET | ORAL | 0 refills | Status: DC | PRN

## 2019-01-31 MED ORDER — AMLODIPINE BESYLATE 5 MG PO TABS
2.5000 mg | ORAL_TABLET | Freq: Every day | ORAL | Status: DC
  Administered 2019-01-31: 10:00:00 2.5 mg via ORAL
  Filled 2019-01-31: qty 1

## 2019-01-31 MED ORDER — AMLODIPINE BESYLATE 2.5 MG PO TABS: 2.5000 mg | ORAL_TABLET | Freq: Every day | ORAL | 0 refills | Status: DC

## 2019-01-31 MED FILL — AMLODIPINE BESYLATE 2.5 MG: 2.5 | 60 days supply | Qty: 60 | Fill #0

## 2019-01-31 MED FILL — OXYCODONE-ACETAMINOPHEN 5-3: 5-325 | 4 days supply | Qty: 30 | Fill #0

## 2019-01-31 MED FILL — IBUPROFEN 800 MG TAB: 800 | 10 days supply | Qty: 30 | Fill #0

## 2019-01-31 NOTE — Discharge Instr - Supplementary Instructions (Signed)
Please schedule for BP recheck in 1 week with your OB office

## 2019-01-31 NOTE — Progress Notes (Signed)
Mom aware to call for a BP recheck in 1 week.

## 2019-02-01 ENCOUNTER — Telehealth: Payer: Self-pay | Admitting: Family Medicine

## 2019-02-01 ENCOUNTER — Telehealth (INDEPENDENT_AMBULATORY_CARE_PROVIDER_SITE_OTHER): Payer: Medicaid Other | Admitting: Family Medicine

## 2019-02-01 DIAGNOSIS — O0990 Supervision of high risk pregnancy, unspecified, unspecified trimester: Secondary | ICD-10-CM | POA: Diagnosis not present

## 2019-02-01 MED ORDER — BLOOD PRESSURE KIT DEVI: 1.0000 | Freq: Once | 0 refills | Status: AC

## 2019-02-01 NOTE — Telephone Encounter (Signed)
Called patient to ask how she has been feeling. Patient states she just got home yesterday from the hospital and was given a prescription for blood pressure pills. She states when she woke up this morning she noticed her vision was blurry which reminded her to take her blood pressure medicine. She states the blurry vision has since gone away but she wanted to call and let us know. Asked if she has a blood pressure cuff at home and she states no. Discussed ordering a cuff for her through [facility] and encouraged patient to call them for follow up if she doesn't hear from them by later on today. Discussed how to check her blood pressure at home, when to check and when to call and notify us. Also reviewed concerning signs & symptoms. Patient verbalized understanding to all and had no questions.

## 2019-02-01 NOTE — Telephone Encounter (Signed)
Patient is requesting a call to discuss how she is feeling.

## 2019-02-01 NOTE — Addendum Note (Signed)
Addended by: Shelly Coss on: 02/01/2019 10:29 AM   Modules accepted: Orders

## 2019-02-01 NOTE — Telephone Encounter (Signed)
Called patient and she states the pharmacy told her insurance doesn't cover it and she would have to get one off the shelf. [facility] and Rx is ready for patient and can be mailed. Called patient back and informed her. Patient verbalized understanding and will have someone pick it up for her. Patient had no questions.

## 2019-02-01 NOTE — Telephone Encounter (Signed)
Need to speak with [facility] about a blood preassure cuff.

## 2019-02-04 ENCOUNTER — Telehealth: Payer: Medicaid Other | Admitting: Nurse Practitioner

## 2019-02-04 NOTE — Progress Notes (Signed)
Had to call patient because she did mot answer some of her questions. She said that sh ehad c sectio several days ago and wanted to know if she could change her dressing. Sh =e actually reached someone on labor and delivery and spoke wit them prior to my phone call.

## 2019-02-07 ENCOUNTER — Telehealth: Payer: Self-pay | Admitting: Lactation Services

## 2019-02-07 ENCOUNTER — Inpatient Hospital Stay (HOSPITAL_COMMUNITY)
Admission: AD | Admit: 2019-02-07 | Discharge: 2019-02-07 | Disposition: A | Discharge status: Home / Self Care | Payer: Medicaid Other | Attending: Obstetrics & Gynecology | Admitting: Obstetrics & Gynecology

## 2019-02-07 ENCOUNTER — Telehealth: Payer: Self-pay | Admitting: *Deleted

## 2019-02-07 ENCOUNTER — Other Ambulatory Visit: Payer: Self-pay

## 2019-02-07 ENCOUNTER — Encounter (HOSPITAL_COMMUNITY): Payer: Self-pay | Admitting: Obstetrics & Gynecology

## 2019-02-07 ENCOUNTER — Encounter: Payer: Self-pay | Admitting: Lactation Services

## 2019-02-07 DIAGNOSIS — R519 Headache, unspecified: Secondary | ICD-10-CM | POA: Diagnosis not present

## 2019-02-07 DIAGNOSIS — O99893 Other specified diseases and conditions complicating puerperium: Secondary | ICD-10-CM | POA: Insufficient documentation

## 2019-02-07 DIAGNOSIS — O135 Gestational [pregnancy-induced] hypertension without significant proteinuria, complicating the puerperium: Secondary | ICD-10-CM | POA: Diagnosis not present

## 2019-02-07 DIAGNOSIS — R600 Localized edema: Secondary | ICD-10-CM | POA: Diagnosis not present

## 2019-02-07 DIAGNOSIS — O165 Unspecified maternal hypertension, complicating the puerperium: Secondary | ICD-10-CM

## 2019-02-07 HISTORY — DX: Gestational (pregnancy-induced) hypertension without significant proteinuria, unspecified trimester: O13.9

## 2019-02-07 LAB — CBC
HCT: 35.5 % — ABNORMAL LOW (ref 36.0–46.0)
Hemoglobin: 11.8 g/dL — ABNORMAL LOW (ref 12.0–15.0)
MCH: 27.7 pg (ref 26.0–34.0)
MCHC: 33.2 g/dL (ref 30.0–36.0)
MCV: 83.3 fL (ref 80.0–100.0)
Platelets: 282 10*3/uL (ref 150–400)
RBC: 4.26 MIL/uL (ref 3.87–5.11)
RDW: 15.5 % (ref 11.5–15.5)
WBC: 7.8 10*3/uL (ref 4.0–10.5)
nRBC: 0 % (ref 0.0–0.2)

## 2019-02-07 LAB — COMPREHENSIVE METABOLIC PANEL
ALT: 31 U/L (ref 0–44)
AST: 16 U/L (ref 15–41)
Albumin: 2.9 g/dL — ABNORMAL LOW (ref 3.5–5.0)
Alkaline Phosphatase: 60 U/L (ref 38–126)
Anion gap: 8 (ref 5–15)
BUN: 9 mg/dL (ref 6–20)
CO2: 23 mmol/L (ref 22–32)
Calcium: 8.9 mg/dL (ref 8.9–10.3)
Chloride: 109 mmol/L (ref 98–111)
Creatinine, Ser: 0.57 mg/dL (ref 0.44–1.00)
GFR calc Af Amer: >60 mL/min (ref >60)
GFR calc non Af Amer: >60 mL/min (ref >60)
Glucose, Bld: 87 mg/dL (ref 70–99)
Potassium: 3.2 mmol/L — ABNORMAL LOW (ref 3.5–5.1)
Sodium: 140 mmol/L (ref 135–145)
Total Bilirubin: 0.7 mg/dL (ref 0.3–1.2)
Total Protein: 6.2 g/dL — ABNORMAL LOW (ref 6.5–8.1)

## 2019-02-07 MED ORDER — CYCLOBENZAPRINE HCL 5 MG PO TABS: 5.0000 mg | ORAL_TABLET | Freq: Three times a day (TID) | ORAL | 0 refills | Status: DC | PRN

## 2019-02-07 MED ORDER — ACETAMINOPHEN 500 MG PO TABS
1000.0000 mg | ORAL_TABLET | Freq: Once | ORAL | Status: AC
  Administered 2019-02-07: 18:00:00 1000 mg via ORAL
  Filled 2019-02-07: qty 2

## 2019-02-07 MED ORDER — CYCLOBENZAPRINE HCL 10 MG PO TABS
10.0000 mg | ORAL_TABLET | Freq: Once | ORAL | Status: AC
  Administered 2019-02-07: 10 mg via ORAL
  Filled 2019-02-07: qty 1

## 2019-02-07 MED ORDER — AMLODIPINE BESYLATE 2.5 MG PO TABS: 5.0000 mg | ORAL_TABLET | Freq: Every day | ORAL | 0 refills | Status: DC

## 2019-02-07 NOTE — MAU Note (Signed)
C/s on 12/6. Pt tested + for Covid on 12/04, with pre- procedural testing.  Only symptom she had was a runny nose, never had fever. Nobody else at home was sick.  Called dr about what she could take for HA.  Was told to come in and be re- evaluated.  Feet had gotten really swollen after delivery, better now, denies visual changes, denies epigastric pain.

## 2019-02-07 NOTE — Discharge Instructions (Signed)
Postpartum Hypertension °Postpartum hypertension is high blood pressure that remains higher than normal after childbirth. You may not realize that you have postpartum hypertension if your blood pressure is not being checked regularly. In most cases, postpartum hypertension will go away on its own, usually within a week of delivery. However, for some women, medical treatment is required to prevent serious complications, such as seizures or stroke. °What are the causes? °This condition may be caused by one or more of the following: °· Hypertension that existed before pregnancy (chronic hypertension). °· Hypertension that comes on as a result of pregnancy (gestational hypertension). °· Hypertensive disorders during pregnancy (preeclampsia) or seizures in women who have high blood pressure during pregnancy (eclampsia). °· A condition in which the liver, platelets, and red blood cells are damaged during pregnancy (HELLP syndrome). °· A condition in which the thyroid produces too much hormones (hyperthyroidism). °· Other rare problems of the nerves (neurological disorders) or blood disorders. °In some cases, the cause may not be known. °What increases the risk? °The following factors may make you more likely to develop this condition: °· Chronic hypertension. In some cases, this may not have been diagnosed before pregnancy. °· Obesity. °· Type 2 diabetes. °· Kidney disease. °· History of preeclampsia or eclampsia. °· Other medical conditions that change the level of hormones in the body (hormonal imbalance). °What are the signs or symptoms? °As with all types of hypertension, postpartum hypertension may not have any symptoms. Depending on how high your blood pressure is, you may experience: °· Headaches. These may be mild, moderate, or severe. They may also be steady, constant, or sudden in onset (thunderclap headache). °· Changes in your ability to see (visual changes). °· Dizziness. °· Shortness of breath. °· Swelling  of your hands, feet, lower legs, or face. In some cases, you may have swelling in more than one of these locations. °· Heart palpitations or a racing heartbeat. °· Difficulty breathing while lying down. °· Decrease in the amount of urine that you pass. °Other rare signs and symptoms may include: °· Sweating more than usual. This lasts longer than a few days after delivery. °· Chest pain. °· Sudden dizziness when you get up from sitting or lying down. °· Seizures. °· Nausea or vomiting. °· Abdominal pain. °How is this diagnosed? °This condition may be diagnosed based on the results of a physical exam, blood pressure measurements, and blood and urine tests. °You may also have other tests, such as a CT scan or an MRI, to check for other problems of postpartum hypertension. °How is this treated? °If blood pressure is high enough to require treatment, your options may include: °· Medicines to reduce blood pressure (antihypertensives). Tell your health care provider if you are breastfeeding or if you plan to breastfeed. There are many antihypertensive medicines that are safe to take while breastfeeding. °· Stopping medicines that may be causing hypertension. °· Treating medical conditions that are causing hypertension. °· Treating the complications of hypertension, such as seizures, stroke, or kidney problems. °Your health care provider will also continue to monitor your blood pressure closely until it is within a safe range for you. °Follow these instructions at home: °· Take over-the-counter and prescription medicines only as told by your health care provider. °· Return to your normal activities as told by your health care provider. Ask your health care provider what activities are safe for you. °· Do not use any products that contain nicotine or tobacco, such as cigarettes and e-cigarettes. If   you need help quitting, ask your health care provider. °· Keep all follow-up visits as told by your health care provider. This  is important. °Contact a health care provider if: °· Your symptoms get worse. °· You have new symptoms, such as: °? A headache that does not get better. °? Dizziness. °? Visual changes. °Get help right away if: °· You suddenly develop swelling in your hands, ankles, or face. °· You have sudden, rapid weight gain. °· You develop difficulty breathing, chest pain, racing heartbeat, or heart palpitations. °· You develop severe pain in your abdomen. °· You have any symptoms of a stroke. "BE FAST" is an easy way to remember the main warning signs of a stroke: °? B - Balance. Signs are dizziness, sudden trouble walking, or loss of balance. °? E - Eyes. Signs are trouble seeing or a sudden change in vision. °? F - Face. Signs are sudden weakness or numbness of the face, or the face or eyelid drooping on one side. °? A - Arms. Signs are weakness or numbness in an arm. This happens suddenly and usually on one side of the body. °? S - Speech. Signs are sudden trouble speaking, slurred speech, or trouble understanding what people say. °? T - Time. Time to call emergency services. Write down what time symptoms started. °· You have other signs of a stroke, such as: °? A sudden, severe headache with no known cause. °? Nausea or vomiting. °? Seizure. °These symptoms may represent a serious problem that is an emergency. Do not wait to see if the symptoms will go away. Get medical help right away. Call your local emergency services (911 in the [place] Do not drive yourself to the hospital. °Summary °· Postpartum hypertension is high blood pressure that remains higher than normal after childbirth. °· In most cases, postpartum hypertension will go away on its own, usually within a week of delivery. °· For some women, medical treatment is required to prevent serious complications, such as seizures or stroke. °This information is not intended to replace advice given to you by your health care provider. Make sure you discuss any questions  you have with your health care provider. °Document Released: 10/13/2013 Document Revised: 03/18/2018 Document Reviewed: 11/30/2016 °Elsevier Patient Education © 2020 [employer] Inc. ° °

## 2019-02-07 NOTE — Telephone Encounter (Signed)
Called pt to let her know we would like her to go to the MAU for evaluation.

## 2019-02-07 NOTE — MAU Provider Note (Signed)
Chief Complaint  Patient presents with  . Headache     First Provider Initiated Contact with Patient 02/07/19 1742      S: Colleen Wells  is a 33 y.o. y.o. year old [id number] female at 1 week postpartum who presents to MAU with elevated blood pressures. Hx of gestational hypertension with this pregnancy. Current blood pressure medication: norvasc 2.5 mg daily. Last took norvasc this morning.  Reports daily headaches since being discharged. Has been taking ibuprofen & advil without relief of symptoms. No associated symptoms & nothing makes pain better or worse.  Her pre procedural covid swab was positive on 12/4. Currently asymptomatic.   Associated symptoms: endorses Headache, denies vision changes, denies epigastric pain  O:  Patient Vitals for the past 24 hrs:  BP Temp Temp src Pulse Resp SpO2  02/07/19 1954 (!) 148/84 -- -- (!) 52 -- --  02/07/19 1916 (!) 153/88 99 F (37.2 C) Oral (!) 58 16 --  02/07/19 1901 (!) 148/83 -- -- (!) 56 -- --  02/07/19 1846 (!) 156/88 -- -- (!) 55 -- --  02/07/19 1831 (!) 158/87 -- -- (!) 53 -- --  02/07/19 1816 (!) 157/87 -- -- (!) 52 -- --  02/07/19 1801 (!) 146/83 -- -- 62 -- --  02/07/19 1746 140/79 -- -- 62 -- --  02/07/19 1731 (!) 141/74 -- -- 64 -- --  02/07/19 1725 (!) 142/75 -- -- 63 -- --  02/07/19 1651 134/82 99 F (37.2 C) Oral 70 17 100 %   General: NAD Heart: Regular rate Lungs: Normal rate and effort Abd: Soft, NT, Gravid, S=D Extremities: non pitting Pedal edema Neuro: 2+ deep tendon reflexes, No clonus  Results for orders placed or performed during the hospital encounter of 02/07/19 (from the past 24 hour(s))  CBC     Status: Abnormal   Collection Time: 02/07/19  5:59 PM  Result Value Ref Range   WBC 7.8 4.0 - 10.5 K/uL   RBC 4.26 3.87 - 5.11 MIL/uL   Hemoglobin 11.8 (L) 12.0 - 15.0 g/dL   HCT 40.8 (L) 14.4 - 81.8 %   MCV 83.3 80.0 - 100.0 fL   MCH 27.7 26.0 - 34.0 pg   MCHC 33.2 30.0 - 36.0 g/dL   RDW 56.3 14.9 -  70.2 %   Platelets 282 150 - 400 K/uL   nRBC 0.0 0.0 - 0.2 %  Comprehensive metabolic panel     Status: Abnormal   Collection Time: 02/07/19  5:59 PM  Result Value Ref Range   Sodium 140 135 - 145 mmol/L   Potassium 3.2 (L) 3.5 - 5.1 mmol/L   Chloride 109 98 - 111 mmol/L   CO2 23 22 - 32 mmol/L   Glucose, Bld 87 70 - 99 mg/dL   BUN 9 6 - 20 mg/dL   Creatinine, Ser 6.37 0.44 - 1.00 mg/dL   Calcium 8.9 8.9 - 85.8 mg/dL   Total Protein 6.2 (L) 6.5 - 8.1 g/dL   Albumin 2.9 (L) 3.5 - 5.0 g/dL   AST 16 15 - 41 U/L   ALT 31 0 - 44 U/L   Alkaline Phosphatase 60 38 - 126 U/L   Total Bilirubin 0.7 0.3 - 1.2 mg/dL   GFR calc non Af Amer >60 >60 mL/min   GFR calc Af Amer >60 >60 mL/min   Anion gap 8 5 - 15    MDM Elevated BPs. None severe range.  PEC labs reassuring.  Headache relieved with tylenol &  flexeril  Will increase norvasc to 5 mg daily. Will send home rx flexeril to use with tylenol prn. Discussed discontinuing advil as it shouldn't be taken in conjunction with her ibuprofen.   A:  1. Postpartum hypertension    P:  Discharge home in stable condition Rx flexeril Increase norvasc to 5 mg daily Keep f/u appt in office next week Discussed reasons to return to MAU  Jorje Guild, NP 02/07/2019 8:03 PM

## 2019-02-07 NOTE — Telephone Encounter (Signed)
Patient sent MyChart message wanting to know what medication to take for headache . Per chart had repeat c/s 01/29/19. Had + Covid test from 01/27/19. Discharged on 01/31/19 and diagnosed with GHTN and started on Norvasc daily. She reports her swelling was a lot but has gone down a lot. States blood pressure today 143/80 . States headaches started day after discharge and has been taking ibuprofen about every 8 hours or so and aleve. States headaches go away for an hour or two and come Wells and are =6. She denies any visual issues since 01/31/19 . States she is taking her norvasc daily and is bottle feeding. Informed her she should not be taking both aleve and ibuprofen and that I will discuss with provider and call her Wells.   Discussed with [name], NP and [name] ,CNM and reviewed her history, symptoms.I called Colleen Wells and informed her providers recommend she go Wells to [facility] asap for evaluation for possible preeclampsia due to continued headaches, recent dx GHTN, recent delivery, recent dx Covid. She voices understanding and agrees with plan of care.  [name],RN

## 2019-02-09 ENCOUNTER — Other Ambulatory Visit: Payer: Self-pay

## 2019-02-09 ENCOUNTER — Ambulatory Visit (INDEPENDENT_AMBULATORY_CARE_PROVIDER_SITE_OTHER): Payer: Medicaid Other | Admitting: General Practice

## 2019-02-09 VITALS — BP 128/82 | HR 78 | Ht 68.0 in | Wt 209.0 lb

## 2019-02-09 DIAGNOSIS — Z5189 Encounter for other specified aftercare: Secondary | ICD-10-CM

## 2019-02-09 NOTE — Progress Notes (Signed)
Patient presents to office today with concern regarding her incision. Patient had 5th c-section on 12/6. Reports yellow drainage and seeing a "piece of meat" a couple places on her incision since removing the bandage on the 13th. Patient also notes an odor coming from incision.   Incision is moist from drainage/sweat but no foul odor noted. Two areas of granulation tissue noted, one approximately 4cm the other 1cm. Dr Rip Harbour assessed incision and applied silver nitrate. Wound care and signs & symptoms of infection reviewed with patient. Patient verbalized understanding & had no questions. Will follow up at pp visit on 1/4.  Koren Bound RN BSN 02/09/19

## 2019-02-10 NOTE — Progress Notes (Signed)
Agree with A & P. 

## 2019-02-14 ENCOUNTER — Ambulatory Visit: Payer: Medicaid Other

## 2019-02-14 ENCOUNTER — Other Ambulatory Visit: Payer: Self-pay | Admitting: *Deleted

## 2019-02-14 DIAGNOSIS — O24419 Gestational diabetes mellitus in pregnancy, unspecified control: Secondary | ICD-10-CM

## 2019-02-22 ENCOUNTER — Telehealth: Payer: Self-pay | Admitting: Obstetrics and Gynecology

## 2019-02-22 NOTE — Telephone Encounter (Signed)
Received a call from Ms. Erpelding that she has some pus and irritation at her incision.

## 2019-02-23 ENCOUNTER — Other Ambulatory Visit: Payer: Self-pay

## 2019-02-23 ENCOUNTER — Ambulatory Visit (INDEPENDENT_AMBULATORY_CARE_PROVIDER_SITE_OTHER): Payer: Medicaid Other | Admitting: Family Medicine

## 2019-02-23 ENCOUNTER — Encounter: Payer: Self-pay | Admitting: Family Medicine

## 2019-02-23 VITALS — BP 135/73 | HR 72 | Ht 68.0 in | Wt 201.0 lb

## 2019-02-23 DIAGNOSIS — Z9889 Other specified postprocedural states: Secondary | ICD-10-CM

## 2019-02-23 DIAGNOSIS — Z4889 Encounter for other specified surgical aftercare: Secondary | ICD-10-CM

## 2019-02-23 MED ORDER — SULFAMETHOXAZOLE-TRIMETHOPRIM 800-160 MG PO TABS: 1.0000 | ORAL_TABLET | Freq: Two times a day (BID) | ORAL | 1 refills | Status: DC

## 2019-02-23 NOTE — Progress Notes (Signed)
   Subjective:    Patient ID: Colleen Wells, female    DOB: Sep 26, 1985, 33 y.o.   MRN: [id number]  HPI Here for concerns of incision infection. She is 25 days postpartum. It is leaking fluid and "pus".    Review of Systems     Objective:   Physical Exam Constitutional:      Appearance: Normal appearance.  Abdominal:     General: Abdomen is flat.     Palpations: Abdomen is soft.     Comments: Overlapping incision in the middle about 4cm in length. Has some granulation tissue, but some submucosa still exposed. Also has submucosa exposed in left corner. Slight erythema.  Neurological:     Mental Status: She is alert.       Assessment & Plan:  1. Encounter for post surgical wound check Possible beginning of infection - no overt abcess. Silver nitrate used. Will place on antibiotics x7 days. Has f/u PP on Monday.

## 2019-02-27 ENCOUNTER — Other Ambulatory Visit: Payer: Self-pay

## 2019-02-27 ENCOUNTER — Ambulatory Visit (INDEPENDENT_AMBULATORY_CARE_PROVIDER_SITE_OTHER): Payer: Medicaid Other | Admitting: Family Medicine

## 2019-02-27 ENCOUNTER — Encounter: Payer: Self-pay | Admitting: Family Medicine

## 2019-02-27 ENCOUNTER — Telehealth: Payer: Self-pay | Admitting: Clinical

## 2019-02-27 VITALS — BP 132/85 | HR 89 | Wt 200.1 lb

## 2019-02-27 DIAGNOSIS — Z362 Encounter for other antenatal screening follow-up: Secondary | ICD-10-CM | POA: Diagnosis not present

## 2019-02-27 DIAGNOSIS — Z5189 Encounter for other specified aftercare: Secondary | ICD-10-CM

## 2019-02-27 DIAGNOSIS — F53 Postpartum depression: Secondary | ICD-10-CM

## 2019-02-27 DIAGNOSIS — Z1389 Encounter for screening for other disorder: Secondary | ICD-10-CM | POA: Diagnosis not present

## 2019-02-27 DIAGNOSIS — O165 Unspecified maternal hypertension, complicating the puerperium: Secondary | ICD-10-CM

## 2019-02-27 MED ORDER — MEDROXYPROGESTERONE ACETATE 10 MG PO TABS: 20.0000 mg | ORAL_TABLET | Freq: Every day | ORAL | 2 refills | Status: DC

## 2019-02-27 NOTE — Progress Notes (Signed)
Subjective:     Colleen Wells is a 34 y.o. female who presents for a postpartum visit. She is 4 weeks postpartum following a cesarean . I have fully reviewed the prenatal and intrapartum course. The delivery was at 39 gestational weeks. Outcome: repeat cesarean section, low transverse incision. Anesthesia: spinal. Postpartum course has been unremarkable. Baby's course has been unremarkable. Baby is feeding by bottle - gerber . Bleeding clots. Bowel function is normal. Bladder function is normal. Patient is not sexually active. Contraception method is Depo-Provera injections. Postpartum depression screening: positive .  The following portions of the patient's history were reviewed and updated as appropriate: allergies, current medications, past family history, past medical history, past social history, past surgical history and problem list.  Review of Systems Pertinent items are noted in HPI.   Objective:    Wt 200 lb 1.6 oz (90.8 kg)   BMI 30.43 kg/m   General:  alert, cooperative and no distress  Lungs: clear to auscultation bilaterally  Heart:  regular rate and rhythm, S1, S2 normal, no murmur, click, rub or gallop  Abdomen: soft, non-tender; bowel sounds normal; no masses,  no organomegaly. Incision with good granulation tissue on the exposed submucosal tissue. No purulent discharge, no evidence of infection.         Assessment:     postpartum exam. PPD. Postpartum bleeding. HTN  Plan:    1. Contraception: Depo-Provera injections - likely etiology of patient's bleeding. Start provera. If bleeding unchanged, switch to COC next visit 2. Patient told me she's taking the bactrim - continue and finish the prescription 3 Continue Norvasc for HTN 4. Discussed medication for PPD - patient declined at this point. Will arrange appt with integrative behavioral health - referral placed Follow up in: 2 weeks or as needed.

## 2019-02-27 NOTE — Telephone Encounter (Signed)
Attempt to call pt after today's visit in [facility] clinic; phone is busy, unable to leave voicemail; left MyChart message for pt.

## 2019-03-06 NOTE — BH Specialist Note (Signed)
Integrated Behavioral Health via Telemedicine Video Visit  03/06/2019 Colleen Wells [id number]  Number of Almedia visits: 1 Session Start time: 2:22 Session End time: 2:44 Total time: 22  Referring Provider: Loma Boston, DO Type of Visit: Video Patient/Family location: Home [facility] Provider location: [facility] All persons participating in visit: Patient Colleen Wells and [place]    Confirmed patient's address: Yes  Confirmed patient's phone number: Yes  Any changes to demographics: No   Confirmed patient's insurance: Yes  Any changes to patient's insurance: No   Discussed confidentiality: Yes    I connected with Angelica Ran  by a video enabled telemedicine application and verified that I am speaking with the correct person using two identifiers.     I discussed the limitations of evaluation and management by telemedicine and the availability of in person appointments.  I discussed that the purpose of this visit is to provide behavioral health care while limiting exposure to the novel coronavirus.   Discussed there is a possibility of technology failure and discussed alternative modes of communication if that failure occurs.  I discussed that engaging in this video visit, they consent to the provision of behavioral healthcare and the services will be billed under their insurance.  Patient and/or legal guardian expressed understanding and consented to video visit: Yes   PRESENTING CONCERNS: Patient and/or family reports the following symptoms/concerns: Pt states her primary symptoms are mild fatigue and mild irritability, is sleeping up to 5 hours at a time at night, has regained her appetite, and only concern is interest in finding new PCP. Pt attributes increase in symptoms immediate postpartum to adjusting to healing from cesarean.  Duration of problem: Postpartum; Severity of problem: mild  STRENGTHS (Protective Factors/Coping  Skills): Good self-care  GOALS ADDRESSED: Patient will: 1.  Maintain reduction of symptoms of: anxiety and depression  2.  Increase knowledge and/or ability of: healthy habits  3.  Demonstrate ability to: Increase healthy adjustment to current life circumstances and Increase adequate support systems for patient/family  INTERVENTIONS: Interventions utilized:  Functional Assessment of ADLs, Psychoeducation and/or Health Education and Link to [employer] Standardized Assessments completed: GAD-7 and PHQ 9  ASSESSMENT: Patient currently experiencing At risk for postpartum depression.   Patient may benefit from psychoeducation and brief therapeutic interventions regarding healthy adjustment postpartum .  PLAN: 1. Follow up with behavioral health clinician on : As needed 2. Behavioral recommendations:  -Continue taking prenatal vitamin daily until postpartum visit -Consider establishing with PCP, per list sent via MyChart of possible options -Consider registering for and attending at least one new mom online support group at either [email or web address] or [email or web address].net -Consider apps, as discussed, for additional self-care 3. Referral(s): Integrated Orthoptist (In Clinic) and [employer] Resources:  New mom support  I discussed the assessment and treatment plan with the patient and/or parent/guardian. They were provided an opportunity to ask questions and all were answered. They agreed with the plan and demonstrated an understanding of the instructions.   They were advised to call back or seek an in-person evaluation if the symptoms worsen or if the condition fails to improve as anticipated.  Caroleen Hamman Melonee Gerstel  Depression screen [facility] 2/9 03/08/2019 02/23/2019 01/25/2019 11/29/2018  Decreased Interest 0 3 0 2  Down, Depressed, Hopeless 0 3 0 0  PHQ - 2 Score 0 6 0 2  Altered sleeping 0 1 1 0  Tired, decreased energy 1 1 0 2  Change in appetite 0 0 0 0  Feeling bad  or failure about yourself  0 0 0 0  Trouble concentrating 0 0 0 0  Moving slowly or fidgety/restless 0 0 0 0  Suicidal thoughts 0 0 0 0  PHQ-9 Score 1 8 1 4   Difficult doing work/chores - - - Not difficult at all   GAD 7 : Generalized Anxiety Score 03/08/2019 02/23/2019 01/25/2019 11/29/2018  Nervous, Anxious, on Edge 0 0 1 0  Control/stop worrying 0 0 0 0  Worry too much - different things 0 0 0 0  Trouble relaxing 0 0 1 0  Restless 0 0 0 0  Easily annoyed or irritable 1 3 1  0  Afraid - awful might happen 0 3 1 0  Total GAD 7 Score 1 6 4  0

## 2019-03-07 DIAGNOSIS — L929 Granulomatous disorder of the skin and subcutaneous tissue, unspecified: Secondary | ICD-10-CM | POA: Diagnosis not present

## 2019-03-08 ENCOUNTER — Other Ambulatory Visit: Payer: Self-pay

## 2019-03-08 ENCOUNTER — Ambulatory Visit (INDEPENDENT_AMBULATORY_CARE_PROVIDER_SITE_OTHER): Payer: Medicaid Other | Admitting: Clinical

## 2019-03-08 DIAGNOSIS — O99345 Other mental disorders complicating the puerperium: Secondary | ICD-10-CM

## 2019-03-08 DIAGNOSIS — Z9189 Other specified personal risk factors, not elsewhere classified: Secondary | ICD-10-CM

## 2019-03-08 DIAGNOSIS — F53 Postpartum depression: Secondary | ICD-10-CM

## 2019-03-08 NOTE — Patient Instructions (Signed)
Hello Colleen Wells, Here are some PCP's who may be taking new patients, and also a list of apps we discussed. Take care, [facility] at [facility] [address] [place],  [place]  [address] [phone]  [facility] [address] [place], [place] [address] [phone]  [facility] [address] [place],  [place]  [address] [phone]  /Emotional Wellbeing Apps and Websites Here are a few free apps meant to help you to help yourself.  To find, try searching on the internet to see if the app is offered on Apple/Android devices. If your first choice doesn't come up on your device, the good news is that there are many choices! Play around with different apps to see which ones are helpful to you.    Calm This is an app meant to help increase calm feelings. Includes info, strategies, and tools for tracking your feelings.      Calm Harm  This app is meant to help with self-harm. Provides many 5-minute or 15-min coping strategies for doing instead of hurting yourself.       Healthy Minds Health Minds is a problem-solving tool to help deal with emotions and cope with stress you encounter wherever you are.      MindShift This app can help people cope with anxiety. Rather than trying to avoid anxiety, you can make an important shift and face it.      MY3  MY3 features a support system, safety plan and resources with the goal of offering a tool to use in a time of need.       My Life My Voice  This mood journal offers a simple solution for tracking your thoughts, feelings and moods. Animated emoticons can help identify your mood.       Relax Melodies Designed to help with sleep, on this app you can mix sounds and meditations for relaxation.      Smiling Mind Smiling Mind is meditation made easy: it's a simple tool that helps put a smile on your mind.        Stop,  Breathe & Think  A friendly, simple guide for people through meditations for mindfulness and compassion.  Stop, Breathe and Think Kids Enter your current feelings and choose a "mission" to help you cope. Offers videos for certain moods instead of just sound recordings.       Team Orange The goal of this tool is to help teens change how they think, act, and react. This app helps you focus on your own good feelings and experiences.      The [employer] Box The [employer] Box (VHB) contains simple tools to help patients with coping, relaxation, distraction, and positive thinking.

## 2019-03-13 ENCOUNTER — Other Ambulatory Visit: Payer: Self-pay

## 2019-03-13 ENCOUNTER — Ambulatory Visit (INDEPENDENT_AMBULATORY_CARE_PROVIDER_SITE_OTHER): Payer: Medicaid Other | Admitting: Family Medicine

## 2019-03-13 ENCOUNTER — Encounter: Payer: Self-pay | Admitting: Family Medicine

## 2019-03-13 VITALS — BP 137/89 | HR 80 | Wt 199.0 lb

## 2019-03-13 DIAGNOSIS — N939 Abnormal uterine and vaginal bleeding, unspecified: Secondary | ICD-10-CM | POA: Diagnosis not present

## 2019-03-13 DIAGNOSIS — Z4889 Encounter for other specified surgical aftercare: Secondary | ICD-10-CM

## 2019-03-13 DIAGNOSIS — O99345 Other mental disorders complicating the puerperium: Secondary | ICD-10-CM | POA: Diagnosis not present

## 2019-03-13 DIAGNOSIS — Z9889 Other specified postprocedural states: Secondary | ICD-10-CM

## 2019-03-13 DIAGNOSIS — F53 Postpartum depression: Secondary | ICD-10-CM

## 2019-03-13 DIAGNOSIS — O165 Unspecified maternal hypertension, complicating the puerperium: Secondary | ICD-10-CM

## 2019-03-13 MED ORDER — SERTRALINE HCL 50 MG PO TABS: 50.0000 mg | ORAL_TABLET | Freq: Every day | ORAL | 3 refills | Status: DC

## 2019-03-13 MED ORDER — NORGESTIMATE-ETH ESTRADIOL 0.25-35 MG-MCG PO TABS: 1.0000 | ORAL_TABLET | Freq: Every day | ORAL | 11 refills | Status: DC

## 2019-03-13 NOTE — BH Specialist Note (Signed)
Pt did not arrive to video visit and did not answer the phone ; Left HIPPA-compliant message to call back Colleen Wells from [facility] at [phone].  ; left MyChart message for patient.    Integrated Behavioral Health via Telemedicine Video Visit  03/13/2019 Colleen Wells [id number]  [name]  Depression screen [facility] 2/9 03/13/2019 03/08/2019 02/23/2019 01/25/2019 11/29/2018  Decreased Interest 0 0 3 0 2  Down, Depressed, Hopeless 1 0 3 0 0  PHQ - 2 Score 1 0 6 0 2  Altered sleeping 0 0 1 1 0  Tired, decreased energy 0 1 1 0 2  Change in appetite 0 0 0 0 0  Feeling bad or failure about yourself  0 0 0 0 0  Trouble concentrating 0 0 0 0 0  Moving slowly or fidgety/restless 0 0 0 0 0  Suicidal thoughts 0 0 0 0 0  PHQ-9 Score 1 1 8 1 4   Difficult doing work/chores - - - - Not difficult at all   GAD 7 : Generalized Anxiety Score 03/13/2019 03/08/2019 02/23/2019 01/25/2019  Nervous, Anxious, on Edge 0 0 0 1  Control/stop worrying 2 0 0 0  Worry too much - different things 2 0 0 0  Trouble relaxing 0 0 0 1  Restless 0 0 0 0  Easily annoyed or irritable 0 1 3 1   Afraid - awful might happen 2 0 3 1  Total GAD 7 Score 6 1 6  4

## 2019-03-13 NOTE — Progress Notes (Signed)
Wants to follow up with jamie [facility] for her PPD.

## 2019-03-13 NOTE — Progress Notes (Signed)
   Subjective:    Patient ID: Colleen Wells, female    DOB: 30-Jun-1985, 34 y.o.   MRN: [id number]  HPI Seen for f/u of PPD, bleeding, and incision. Her incision is healing well. Feels like her PPD is getting worse. Has a lot of anxiety with her baby.   Bleeding not improved on the Provera. Had Depo in the hospital.   Review of Systems     Objective:   Physical Exam Vitals reviewed.  Constitutional:      Appearance: Normal appearance.  Cardiovascular:     Rate and Rhythm: Normal rate and regular rhythm.     Pulses: Normal pulses.  Pulmonary:     Effort: Pulmonary effort is normal.     Breath sounds: Normal breath sounds.  Abdominal:     Comments: Incision healing well. Small 84mm defect in middle - good granulation tissue.  Skin:    Capillary Refill: Capillary refill takes less than 2 seconds.  Neurological:     General: No focal deficit present.     Mental Status: She is alert.     Cranial Nerves: No cranial nerve deficit.  Psychiatric:        Mood and Affect: Mood normal.        Thought Content: Thought content normal.        Judgment: Judgment normal.       Assessment & Plan:  1. Postpartum depression Start zoloft. Will schedule her a f/u appt with Colleen Wells.  2. Postpartum hypertension Continue antihypertensive  3. Encounter for post surgical wound check Well healed.  4. Abnormal uterine bleeding (AUB) Had depo in hospital.  On Provera 20mg  x2 weeks wihtout improvement. Will stop provera and start Sprintec.

## 2019-03-16 ENCOUNTER — Ambulatory Visit: Payer: Medicaid Other | Admitting: Clinical

## 2019-03-16 ENCOUNTER — Other Ambulatory Visit: Payer: Self-pay

## 2019-03-16 DIAGNOSIS — Z5329 Procedure and treatment not carried out because of patient's decision for other reasons: Secondary | ICD-10-CM

## 2019-03-16 DIAGNOSIS — Z91199 Patient's noncompliance with other medical treatment and regimen due to unspecified reason: Secondary | ICD-10-CM

## 2019-04-10 ENCOUNTER — Ambulatory Visit (INDEPENDENT_AMBULATORY_CARE_PROVIDER_SITE_OTHER): Payer: Medicaid Other | Admitting: Family Medicine

## 2019-04-10 ENCOUNTER — Other Ambulatory Visit: Payer: Self-pay

## 2019-04-10 VITALS — BP 142/89 | HR 94 | Wt 205.0 lb

## 2019-04-10 DIAGNOSIS — F53 Postpartum depression: Secondary | ICD-10-CM

## 2019-04-10 DIAGNOSIS — O99345 Other mental disorders complicating the puerperium: Secondary | ICD-10-CM

## 2019-04-10 DIAGNOSIS — N939 Abnormal uterine and vaginal bleeding, unspecified: Secondary | ICD-10-CM | POA: Diagnosis not present

## 2019-04-10 NOTE — Progress Notes (Signed)
   Subjective:    Patient ID: Colleen Wells, female    DOB: 11/11/1985, 34 y.o.   MRN: [id number]  HPI  Patient seen for follow up of PPD and bleeding. Her bleeding has stopped, her incision is healed, and her depression is much improved. She did not start the zoloft.  Review of Systems     Objective:   Physical Exam Vitals reviewed.  Constitutional:      Appearance: Normal appearance.  Cardiovascular:     Rate and Rhythm: Normal rate.     Pulses: Normal pulses.  Pulmonary:     Effort: Pulmonary effort is normal.  Skin:    Capillary Refill: Capillary refill takes less than 2 seconds.  Neurological:     General: No focal deficit present.     Mental Status: She is alert. Mental status is at baseline.        Assessment & Plan:  1. Postpartum depression Improved  2. Abnormal uterine bleeding (AUB) Resolved. Return for depo.

## 2019-06-21 ENCOUNTER — Other Ambulatory Visit: Payer: Self-pay | Admitting: Student

## 2019-07-13 ENCOUNTER — Other Ambulatory Visit: Payer: Self-pay | Admitting: Student

## 2019-07-29 DIAGNOSIS — B373 Candidiasis of vulva and vagina: Secondary | ICD-10-CM | POA: Diagnosis not present

## 2019-07-29 DIAGNOSIS — Z113 Encounter for screening for infections with a predominantly sexual mode of transmission: Secondary | ICD-10-CM | POA: Diagnosis not present

## 2019-07-29 DIAGNOSIS — N760 Acute vaginitis: Secondary | ICD-10-CM | POA: Diagnosis not present

## 2019-08-14 ENCOUNTER — Other Ambulatory Visit: Payer: Self-pay | Admitting: Student

## 2020-01-15 DIAGNOSIS — Z1159 Encounter for screening for other viral diseases: Secondary | ICD-10-CM | POA: Diagnosis not present

## 2020-01-15 DIAGNOSIS — Z131 Encounter for screening for diabetes mellitus: Secondary | ICD-10-CM | POA: Diagnosis not present

## 2020-01-15 DIAGNOSIS — Z0000 Encounter for general adult medical examination without abnormal findings: Secondary | ICD-10-CM | POA: Diagnosis not present

## 2020-01-15 DIAGNOSIS — Z114 Encounter for screening for human immunodeficiency virus [HIV]: Secondary | ICD-10-CM | POA: Diagnosis not present

## 2020-01-15 DIAGNOSIS — Z13220 Encounter for screening for lipoid disorders: Secondary | ICD-10-CM | POA: Diagnosis not present

## 2020-01-29 DIAGNOSIS — R319 Hematuria, unspecified: Secondary | ICD-10-CM | POA: Diagnosis not present

## 2020-01-29 DIAGNOSIS — R7303 Prediabetes: Secondary | ICD-10-CM | POA: Diagnosis not present

## 2020-01-29 DIAGNOSIS — E785 Hyperlipidemia, unspecified: Secondary | ICD-10-CM | POA: Diagnosis not present

## 2020-04-25 DIAGNOSIS — Z1152 Encounter for screening for COVID-19: Secondary | ICD-10-CM | POA: Diagnosis not present

## 2020-04-28 DIAGNOSIS — H539 Unspecified visual disturbance: Secondary | ICD-10-CM | POA: Diagnosis not present

## 2020-04-28 DIAGNOSIS — E785 Hyperlipidemia, unspecified: Secondary | ICD-10-CM | POA: Diagnosis not present

## 2020-04-28 DIAGNOSIS — R319 Hematuria, unspecified: Secondary | ICD-10-CM | POA: Diagnosis not present

## 2020-04-28 DIAGNOSIS — R7303 Prediabetes: Secondary | ICD-10-CM | POA: Diagnosis not present

## 2020-05-09 DIAGNOSIS — Z1152 Encounter for screening for COVID-19: Secondary | ICD-10-CM | POA: Diagnosis not present

## 2020-08-05 DIAGNOSIS — Z1152 Encounter for screening for COVID-19: Secondary | ICD-10-CM | POA: Diagnosis not present

## 2020-08-09 ENCOUNTER — Ambulatory Visit
Admission: EM | Admit: 2020-08-09 | Discharge: 2020-08-09 | Disposition: A | Discharge status: Home / Self Care | Payer: Medicaid Other | Attending: Student | Admitting: Student

## 2020-08-09 ENCOUNTER — Other Ambulatory Visit: Payer: Self-pay

## 2020-08-09 DIAGNOSIS — N760 Acute vaginitis: Secondary | ICD-10-CM | POA: Diagnosis not present

## 2020-08-09 DIAGNOSIS — R21 Rash and other nonspecific skin eruption: Secondary | ICD-10-CM | POA: Diagnosis not present

## 2020-08-09 DIAGNOSIS — B9689 Other specified bacterial agents as the cause of diseases classified elsewhere: Secondary | ICD-10-CM

## 2020-08-09 LAB — POCT URINE PREGNANCY: Preg Test, Ur: NEGATIVE

## 2020-08-09 MED ORDER — METRONIDAZOLE 500 MG PO TABS: 500.0000 mg | ORAL_TABLET | Freq: Two times a day (BID) | ORAL | 0 refills | Status: DC

## 2020-08-09 MED ORDER — HYDROCORTISONE 0.5 % EX OINT: 1.0000 "application " | TOPICAL_OINTMENT | Freq: Two times a day (BID) | CUTANEOUS | 0 refills | Status: AC

## 2020-08-09 MED ORDER — FLUCONAZOLE 150 MG PO TABS: 150.0000 mg | ORAL_TABLET | Freq: Every day | ORAL | 0 refills | Status: AC

## 2020-08-09 NOTE — ED Triage Notes (Signed)
Pt states using nair yesterday to perineum area and got some on her c-section incision (12/20) and its still burning and now has draining.   Pt requesting to be check for BV. States having a vaginal discharge and slight odor.

## 2020-08-09 NOTE — Discharge Instructions (Addendum)
-  For your yeast infection, start the Diflucan (fluconazole)- Take one pill today (day 1). If you're still having symptoms in 3 days, take the second pill.  -For your BV, Flagyl twice daily for 7 days.  Make sure to take this with food if you have a sensitive stomach.  Avoid alcohol while taking this medication as it could cause nausea and vomiting. -Apply the hydrocortisone ointment to your rash twice daily for up to 7 days.  Wash the area with gentle soap and water twice daily. -Seek additional medical attention if symptoms worsen or persist.

## 2020-08-09 NOTE — ED Provider Notes (Signed)
[facility]    CSN: [id number] Arrival date & time: 08/09/20  1842      History   Chief Complaint Chief Complaint  Patient presents with   Wound Check   Vaginal Discharge    HPI Colleen Wells is a 35 y.o. female presenting with vaginal discharge and for wound check. C-section performed 01/2019.  States she used Darene Lamer on her lower abdomen overlying the C-section scar 1 day ago, following this she developed significant irritation in the area with some serous discharge from the C-section area.  States she is concerned her C-section scar is opening again.  Also notes about 3 weeks of thin watery vaginal discharge with external vaginal itching and smell.  Denies new partners.  States she has a long history of BV. Denies hematuria, dysuria, frequency, urgency, back pain, n/v/d/abd pain, fevers/chills, abdnormal vaginal lesions. States she is not pregnant.  Triage note states rash to perineum; this is not true. Pt denies rashes or lesions in the genital area.   HPI  Past Medical History:  Diagnosis Date   Arthrofibrosis of knee joint    right   Gestational diabetes    Meniscus tear    Pregnancy induced hypertension     Patient Active Problem List   Diagnosis Date Noted   COVID-19 affecting pregnancy in third trimester 01/30/2019   Post-operative state 01/29/2019   Genetic carrier 01/16/2019   History of C-section 11/29/2018    Past Surgical History:  Procedure Laterality Date   ANTERIOR CRUCIATE LIGAMENT REPAIR Right 09/03/2015   Procedure: REPAIR ANTERIOR CRUCIATE LIGAMENT  REPAIR OF AVULSION;  Surgeon: Cammy Copa, MD;  Location: [facility] OR;  Service: Orthopedics;  Laterality: Right;   CESAREAN SECTION     x4   CESAREAN SECTION N/A 01/29/2019   Procedure: CESAREAN SECTION;  Surgeon: Allie Bossier, MD;  Location: [facility] LD ORS;  Service: Obstetrics;  Laterality: N/A;   KNEE CLOSED REDUCTION Right 10/08/2015   Procedure: CLOSED MANIPULATION KNEE UNDER  ANESTHESIA;  Surgeon: Cammy Copa, MD;  Location: [facility] OR;  Service: Orthopedics;  Laterality: Right;   MENISCUS REPAIR Right 09/03/2015   Procedure: REPAIR OF MENISCUS LATERAL;  Surgeon: Cammy Copa, MD;  Location: [facility] OR;  Service: Orthopedics;  Laterality: Right;    OB History     Gravida  6   Para  5   Term  5   Preterm      AB  1   Living  5      SAB  1   IAB      Ectopic      Multiple  0   Live Births  5            Home Medications    Prior to Admission medications   Medication Sig Start Date End Date Taking? Authorizing Provider  fluconazole (DIFLUCAN) 150 MG tablet Take 1 tablet (150 mg total) by mouth daily for 2 doses. -For your yeast infection, start the Diflucan (fluconazole)- Take one pill today (day 1). If you're still having symptoms in 3 days, take the second pill. 08/09/20 08/11/20 Yes Rhys Martini, PA-C  hydrocortisone ointment 0.5 % Apply 1 application topically 2 (two) times daily for 7 days. Use twice daily while symptoms persist, up to 7 days 08/09/20 08/16/20 Yes Rhys Martini, PA-C  metroNIDAZOLE (FLAGYL) 500 MG tablet Take 1 tablet (500 mg total) by mouth 2 (two) times daily. 08/09/20  Yes Rhys Martini, PA-C  Family History Family History  Problem Relation Age of Onset   Healthy Mother    Healthy Father     Social History Social History   Tobacco Use   Smoking status: Never   Smokeless tobacco: Never  Vaping Use   Vaping Use: Never used  Substance Use Topics   Alcohol use: No   Drug use: No     Allergies   Patient has no known allergies.   Review of Systems Review of Systems  Constitutional:  Negative for appetite change, chills, diaphoresis and fever.  Respiratory:  Negative for shortness of breath.   Cardiovascular:  Negative for chest pain.  Gastrointestinal:  Negative for abdominal pain, blood in stool, constipation, diarrhea, nausea and vomiting.  Genitourinary:  Positive for vaginal discharge.  Negative for decreased urine volume, difficulty urinating, dysuria, flank pain, frequency, genital sores, hematuria, menstrual problem, pelvic pain, urgency, vaginal bleeding and vaginal pain.  Musculoskeletal:  Negative for back pain.  Skin:  Positive for rash.  Neurological:  Negative for dizziness, weakness and light-headedness.  All other systems reviewed and are negative.   Physical Exam Triage Vital Signs ED Triage Vitals [08/09/20 1953]  Enc Vitals Group     BP 128/81     Pulse Rate 81     Resp 18     Temp 98.8 F (37.1 C)     Temp Source Oral     SpO2 98 %     Weight      Height      Head Circumference      Peak Flow      Pain Score 0     Pain Loc      Pain Edu?      Excl. in GC?    No data found.  Updated Vital Signs BP 128/81 (BP Location: Left Arm)   Pulse 81   Temp 98.8 F (37.1 C) (Oral)   Resp 18   LMP 08/02/2020   SpO2 98%   Breastfeeding No   Visual Acuity Right Eye Distance:   Left Eye Distance:   Bilateral Distance:    Right Eye Near:   Left Eye Near:    Bilateral Near:     Physical Exam Vitals reviewed.  Constitutional:      General: She is not in acute distress.    Appearance: Normal appearance. She is not ill-appearing.  HENT:     Head: Normocephalic and atraumatic.     Mouth/Throat:     Mouth: Mucous membranes are moist.     Comments: Moist mucous membranes Eyes:     Extraocular Movements: Extraocular movements intact.     Pupils: Pupils are equal, round, and reactive to light.  Cardiovascular:     Rate and Rhythm: Normal rate and regular rhythm.     Heart sounds: Normal heart sounds.  Pulmonary:     Effort: Pulmonary effort is normal.     Breath sounds: Normal breath sounds. No wheezing, rhonchi or rales.  Abdominal:     General: Bowel sounds are normal. There is no distension.     Palpations: Abdomen is soft. There is no mass.     Tenderness: There is no abdominal tenderness. There is no right CVA tenderness, left CVA  tenderness, guarding or rebound.     Comments: Pannus with erythema and few beefy red plaques with scant serous discharge. C-section scar is not opening.   No abd pain  Genitourinary:    Comments: Deferred  Skin:  General: Skin is warm.     Capillary Refill: Capillary refill takes less than 2 seconds.     Comments: Good skin turgor  Neurological:     General: No focal deficit present.     Mental Status: She is alert and oriented to person, place, and time.  Psychiatric:        Mood and Affect: Mood normal.        Behavior: Behavior normal.     UC Treatments / Results  Labs (all labs ordered are listed, but only abnormal results are displayed) Labs Reviewed  POCT URINE PREGNANCY  CERVICOVAGINAL ANCILLARY ONLY    EKG   Radiology No results found.  Procedures Procedures (including critical care time)  Medications Ordered in UC Medications - No data to display  Initial Impression / Assessment and Plan / UC Course  I have reviewed the triage vital signs and the nursing notes.  Pertinent labs & imaging results that were available during my care of the patient were reviewed by me and considered in my medical decision making (see chart for details).     This patient is a 35 year old female presenting with bacterial vaginosis and skin irritation following using Darene Lamer.  Afebrile, nontachycardic, no abdominal pain or CVAT.  Denies STI risk.  Self swab sent for BV, yeast, gonorrhea, chlamydia, trichomonas.  We will treat empirically with Flagyl and Diflucan as below, also sent hydrocortisone ointment.  States she is not pregnant.  ED return precautions discussed. Patient verbalizes understanding and agreement.    Final Clinical Impressions(s) / UC Diagnoses   Final diagnoses:  Bacterial vaginosis  Rash and nonspecific skin eruption     Discharge Instructions      -For your yeast infection, start the Diflucan (fluconazole)- Take one pill today (day 1). If  you're still having symptoms in 3 days, take the second pill.  -For your BV, Flagyl twice daily for 7 days.  Make sure to take this with food if you have a sensitive stomach.  Avoid alcohol while taking this medication as it could cause nausea and vomiting. -Apply the hydrocortisone ointment to your rash twice daily for up to 7 days.  Wash the area with gentle soap and water twice daily. -Seek additional medical attention if symptoms worsen or persist.     ED Prescriptions     Medication Sig Dispense Auth. Provider   hydrocortisone ointment 0.5 % Apply 1 application topically 2 (two) times daily for 7 days. Use twice daily while symptoms persist, up to 7 days 30 g Rhys Martini, PA-C   fluconazole (DIFLUCAN) 150 MG tablet Take 1 tablet (150 mg total) by mouth daily for 2 doses. -For your yeast infection, start the Diflucan (fluconazole)- Take one pill today (day 1). If you're still having symptoms in 3 days, take the second pill. 2 tablet Rhys Martini, PA-C   metroNIDAZOLE (FLAGYL) 500 MG tablet Take 1 tablet (500 mg total) by mouth 2 (two) times daily. 14 tablet Rhys Martini, PA-C      PDMP not reviewed this encounter.   Rhys Martini, PA-C 08/09/20 2021

## 2020-08-12 DIAGNOSIS — Z1152 Encounter for screening for COVID-19: Secondary | ICD-10-CM | POA: Diagnosis not present

## 2020-08-12 LAB — CERVICOVAGINAL ANCILLARY ONLY
Bacterial Vaginitis (gardnerella): POSITIVE — AB
Candida Glabrata: NEGATIVE
Candida Vaginitis: NEGATIVE
Chlamydia: NEGATIVE
Comment: NEGATIVE
Comment: NEGATIVE
Comment: NEGATIVE
Comment: NEGATIVE
Comment: NEGATIVE
Comment: NORMAL
Neisseria Gonorrhea: NEGATIVE
Trichomonas: NEGATIVE

## 2020-08-19 DIAGNOSIS — Z1152 Encounter for screening for COVID-19: Secondary | ICD-10-CM | POA: Diagnosis not present

## 2020-08-29 DIAGNOSIS — Z1152 Encounter for screening for COVID-19: Secondary | ICD-10-CM | POA: Diagnosis not present

## 2020-09-14 DIAGNOSIS — Z1152 Encounter for screening for COVID-19: Secondary | ICD-10-CM | POA: Diagnosis not present

## 2020-09-19 DIAGNOSIS — Z1152 Encounter for screening for COVID-19: Secondary | ICD-10-CM | POA: Diagnosis not present

## 2020-09-26 DIAGNOSIS — Z1152 Encounter for screening for COVID-19: Secondary | ICD-10-CM | POA: Diagnosis not present

## 2020-10-02 DIAGNOSIS — Z1152 Encounter for screening for COVID-19: Secondary | ICD-10-CM | POA: Diagnosis not present

## 2020-10-21 DIAGNOSIS — Z1152 Encounter for screening for COVID-19: Secondary | ICD-10-CM | POA: Diagnosis not present

## 2020-11-04 DIAGNOSIS — Z1152 Encounter for screening for COVID-19: Secondary | ICD-10-CM | POA: Diagnosis not present

## 2020-11-10 DIAGNOSIS — Z1152 Encounter for screening for COVID-19: Secondary | ICD-10-CM | POA: Diagnosis not present

## 2020-11-26 DIAGNOSIS — Z1152 Encounter for screening for COVID-19: Secondary | ICD-10-CM | POA: Diagnosis not present

## 2020-12-03 DIAGNOSIS — Z1152 Encounter for screening for COVID-19: Secondary | ICD-10-CM | POA: Diagnosis not present

## 2020-12-18 DIAGNOSIS — J069 Acute upper respiratory infection, unspecified: Secondary | ICD-10-CM | POA: Diagnosis not present

## 2020-12-18 DIAGNOSIS — Z20822 Contact with and (suspected) exposure to covid-19: Secondary | ICD-10-CM | POA: Diagnosis not present

## 2020-12-26 DIAGNOSIS — B379 Candidiasis, unspecified: Secondary | ICD-10-CM | POA: Diagnosis not present

## 2020-12-26 DIAGNOSIS — R7303 Prediabetes: Secondary | ICD-10-CM | POA: Diagnosis not present

## 2020-12-26 DIAGNOSIS — E785 Hyperlipidemia, unspecified: Secondary | ICD-10-CM | POA: Diagnosis not present

## 2020-12-26 DIAGNOSIS — Z7251 High risk heterosexual behavior: Secondary | ICD-10-CM | POA: Diagnosis not present

## 2020-12-26 DIAGNOSIS — J029 Acute pharyngitis, unspecified: Secondary | ICD-10-CM | POA: Diagnosis not present

## 2020-12-26 HISTORY — DX: Prediabetes: R73.03

## 2021-01-08 DIAGNOSIS — Z1152 Encounter for screening for COVID-19: Secondary | ICD-10-CM | POA: Diagnosis not present

## 2021-01-24 DIAGNOSIS — R7303 Prediabetes: Secondary | ICD-10-CM | POA: Diagnosis not present

## 2021-01-24 DIAGNOSIS — Z789 Other specified health status: Secondary | ICD-10-CM | POA: Diagnosis not present

## 2021-01-24 DIAGNOSIS — R319 Hematuria, unspecified: Secondary | ICD-10-CM | POA: Diagnosis not present

## 2021-01-27 DIAGNOSIS — Z1152 Encounter for screening for COVID-19: Secondary | ICD-10-CM | POA: Diagnosis not present

## 2021-02-03 DIAGNOSIS — Z1152 Encounter for screening for COVID-19: Secondary | ICD-10-CM | POA: Diagnosis not present

## 2021-02-10 DIAGNOSIS — Z1152 Encounter for screening for COVID-19: Secondary | ICD-10-CM | POA: Diagnosis not present

## 2021-02-18 DIAGNOSIS — Z1152 Encounter for screening for COVID-19: Secondary | ICD-10-CM | POA: Diagnosis not present

## 2021-02-26 DIAGNOSIS — Z1152 Encounter for screening for COVID-19: Secondary | ICD-10-CM | POA: Diagnosis not present

## 2021-03-05 DIAGNOSIS — Z1152 Encounter for screening for COVID-19: Secondary | ICD-10-CM | POA: Diagnosis not present

## 2021-03-12 DIAGNOSIS — Z1152 Encounter for screening for COVID-19: Secondary | ICD-10-CM | POA: Diagnosis not present

## 2021-04-18 DIAGNOSIS — Z1152 Encounter for screening for COVID-19: Secondary | ICD-10-CM | POA: Diagnosis not present

## 2021-04-27 DIAGNOSIS — Z1152 Encounter for screening for COVID-19: Secondary | ICD-10-CM | POA: Diagnosis not present

## 2021-05-02 DIAGNOSIS — Z1152 Encounter for screening for COVID-19: Secondary | ICD-10-CM | POA: Diagnosis not present

## 2021-05-10 DIAGNOSIS — Z1152 Encounter for screening for COVID-19: Secondary | ICD-10-CM | POA: Diagnosis not present

## 2021-05-17 DIAGNOSIS — Z1152 Encounter for screening for COVID-19: Secondary | ICD-10-CM | POA: Diagnosis not present

## 2021-05-21 DIAGNOSIS — Z3201 Encounter for pregnancy test, result positive: Secondary | ICD-10-CM | POA: Diagnosis not present

## 2021-05-21 DIAGNOSIS — R030 Elevated blood-pressure reading, without diagnosis of hypertension: Secondary | ICD-10-CM | POA: Diagnosis not present

## 2021-05-21 DIAGNOSIS — Z6831 Body mass index (BMI) 31.0-31.9, adult: Secondary | ICD-10-CM | POA: Diagnosis not present

## 2021-05-21 DIAGNOSIS — Z0130 Encounter for examination of blood pressure without abnormal findings: Secondary | ICD-10-CM | POA: Diagnosis not present

## 2021-05-21 LAB — PREGNANCY, URINE: Preg Test, Ur: POSITIVE

## 2021-06-08 DIAGNOSIS — Z1152 Encounter for screening for COVID-19: Secondary | ICD-10-CM | POA: Diagnosis not present

## 2021-06-09 ENCOUNTER — Encounter: Payer: Self-pay | Admitting: *Deleted

## 2021-06-09 DIAGNOSIS — R7303 Prediabetes: Secondary | ICD-10-CM

## 2021-06-12 DIAGNOSIS — A499 Bacterial infection, unspecified: Secondary | ICD-10-CM | POA: Diagnosis not present

## 2021-06-12 DIAGNOSIS — O26819 Pregnancy related exhaustion and fatigue, unspecified trimester: Secondary | ICD-10-CM | POA: Diagnosis not present

## 2021-06-12 DIAGNOSIS — Z3200 Encounter for pregnancy test, result unknown: Secondary | ICD-10-CM | POA: Diagnosis not present

## 2021-06-12 DIAGNOSIS — Z0130 Encounter for examination of blood pressure without abnormal findings: Secondary | ICD-10-CM | POA: Diagnosis not present

## 2021-06-12 DIAGNOSIS — Z3490 Encounter for supervision of normal pregnancy, unspecified, unspecified trimester: Secondary | ICD-10-CM | POA: Diagnosis not present

## 2021-06-12 DIAGNOSIS — N390 Urinary tract infection, site not specified: Secondary | ICD-10-CM | POA: Diagnosis not present

## 2021-06-12 DIAGNOSIS — R300 Dysuria: Secondary | ICD-10-CM | POA: Diagnosis not present

## 2021-06-12 DIAGNOSIS — Z79899 Other long term (current) drug therapy: Secondary | ICD-10-CM | POA: Diagnosis not present

## 2021-06-12 DIAGNOSIS — D539 Nutritional anemia, unspecified: Secondary | ICD-10-CM | POA: Diagnosis not present

## 2021-06-12 DIAGNOSIS — Z6831 Body mass index (BMI) 31.0-31.9, adult: Secondary | ICD-10-CM | POA: Diagnosis not present

## 2021-06-14 DIAGNOSIS — Z1152 Encounter for screening for COVID-19: Secondary | ICD-10-CM | POA: Diagnosis not present

## 2021-06-20 DIAGNOSIS — Z1152 Encounter for screening for COVID-19: Secondary | ICD-10-CM | POA: Diagnosis not present

## 2021-06-23 DIAGNOSIS — Z31430 Encounter of female for testing for genetic disease carrier status for procreative management: Secondary | ICD-10-CM | POA: Diagnosis not present

## 2021-06-23 DIAGNOSIS — A599 Trichomoniasis, unspecified: Secondary | ICD-10-CM | POA: Diagnosis not present

## 2021-06-23 DIAGNOSIS — R824 Acetonuria: Secondary | ICD-10-CM | POA: Diagnosis not present

## 2021-06-23 DIAGNOSIS — Z9889 Other specified postprocedural states: Secondary | ICD-10-CM | POA: Diagnosis not present

## 2021-06-23 DIAGNOSIS — Z3481 Encounter for supervision of other normal pregnancy, first trimester: Secondary | ICD-10-CM | POA: Diagnosis not present

## 2021-06-23 DIAGNOSIS — Z0389 Encounter for observation for other suspected diseases and conditions ruled out: Secondary | ICD-10-CM | POA: Diagnosis not present

## 2021-06-23 DIAGNOSIS — O24419 Gestational diabetes mellitus in pregnancy, unspecified control: Secondary | ICD-10-CM | POA: Diagnosis not present

## 2021-06-23 DIAGNOSIS — Z8632 Personal history of gestational diabetes: Secondary | ICD-10-CM | POA: Diagnosis not present

## 2021-06-23 DIAGNOSIS — O1211 Gestational proteinuria, first trimester: Secondary | ICD-10-CM | POA: Diagnosis not present

## 2021-06-23 DIAGNOSIS — O99011 Anemia complicating pregnancy, first trimester: Secondary | ICD-10-CM | POA: Diagnosis not present

## 2021-06-23 DIAGNOSIS — O99211 Obesity complicating pregnancy, first trimester: Secondary | ICD-10-CM | POA: Diagnosis not present

## 2021-06-23 DIAGNOSIS — O2340 Unspecified infection of urinary tract in pregnancy, unspecified trimester: Secondary | ICD-10-CM | POA: Diagnosis not present

## 2021-06-23 DIAGNOSIS — O0941 Supervision of pregnancy with grand multiparity, first trimester: Secondary | ICD-10-CM | POA: Diagnosis not present

## 2021-06-23 DIAGNOSIS — Z331 Pregnant state, incidental: Secondary | ICD-10-CM | POA: Diagnosis not present

## 2021-06-23 DIAGNOSIS — R823 Hemoglobinuria: Secondary | ICD-10-CM | POA: Diagnosis not present

## 2021-06-23 LAB — OB RESULTS CONSOLE HGB/HCT, BLOOD
HCT: 33 (ref 29–41)
Hemoglobin: 10.5

## 2021-06-23 LAB — CYTOLOGY - PAP: Pap: NEGATIVE

## 2021-06-23 LAB — OB RESULTS CONSOLE ABO/RH: RH Type: POSITIVE

## 2021-06-23 LAB — OB RESULTS CONSOLE HEPATITIS B SURFACE ANTIGEN: Hepatitis B Surface Ag: NEGATIVE

## 2021-06-23 LAB — OB RESULTS CONSOLE ANTIBODY SCREEN: Antibody Screen: NEGATIVE

## 2021-06-23 LAB — OB RESULTS CONSOLE RUBELLA ANTIBODY, IGM: Rubella: IMMUNE

## 2021-06-23 LAB — OB RESULTS CONSOLE GC/CHLAMYDIA
Chlamydia: NEGATIVE
Gonorrhea: NEGATIVE

## 2021-06-23 LAB — OB RESULTS CONSOLE PLATELET COUNT: Platelets: 335

## 2021-06-23 LAB — GLUCOSE TOLERANCE, 1 HOUR: Glucose, 1 Hour GTT: 194

## 2021-06-24 ENCOUNTER — Telehealth: Payer: Medicaid Other

## 2021-06-25 LAB — OB RESULTS CONSOLE HIV ANTIBODY (ROUTINE TESTING): HIV: NONREACTIVE

## 2021-06-27 DIAGNOSIS — Z1152 Encounter for screening for COVID-19: Secondary | ICD-10-CM | POA: Diagnosis not present

## 2021-07-06 DIAGNOSIS — Z1152 Encounter for screening for COVID-19: Secondary | ICD-10-CM | POA: Diagnosis not present

## 2021-07-07 ENCOUNTER — Encounter: Payer: Self-pay | Admitting: *Deleted

## 2021-07-09 ENCOUNTER — Encounter: Payer: Self-pay | Admitting: *Deleted

## 2021-07-09 ENCOUNTER — Encounter: Payer: Self-pay | Admitting: Certified Nurse Midwife

## 2021-07-09 ENCOUNTER — Ambulatory Visit (INDEPENDENT_AMBULATORY_CARE_PROVIDER_SITE_OTHER): Payer: Medicaid Other | Admitting: Certified Nurse Midwife

## 2021-07-09 ENCOUNTER — Other Ambulatory Visit (HOSPITAL_COMMUNITY)
Admission: RE | Admit: 2021-07-09 | Discharge: 2021-07-09 | Disposition: A | Discharge status: Home / Self Care | Payer: Medicaid Other | Source: Ambulatory Visit | Attending: Certified Nurse Midwife | Admitting: Certified Nurse Midwife

## 2021-07-09 VITALS — BP 130/73 | HR 88 | Wt 201.7 lb

## 2021-07-09 DIAGNOSIS — O0991 Supervision of high risk pregnancy, unspecified, first trimester: Secondary | ICD-10-CM

## 2021-07-09 DIAGNOSIS — O0990 Supervision of high risk pregnancy, unspecified, unspecified trimester: Secondary | ICD-10-CM | POA: Insufficient documentation

## 2021-07-09 DIAGNOSIS — R7303 Prediabetes: Secondary | ICD-10-CM

## 2021-07-09 DIAGNOSIS — O24419 Gestational diabetes mellitus in pregnancy, unspecified control: Secondary | ICD-10-CM

## 2021-07-09 DIAGNOSIS — Z3400 Encounter for supervision of normal first pregnancy, unspecified trimester: Secondary | ICD-10-CM

## 2021-07-09 DIAGNOSIS — Z98891 History of uterine scar from previous surgery: Secondary | ICD-10-CM

## 2021-07-09 MED ORDER — BLOOD PRESSURE KIT DEVI: 1.0000 | 0 refills | Status: DC | PRN

## 2021-07-09 MED ORDER — GOJJI WEIGHT SCALE MISC: 1.0000 | 0 refills | Status: DC | PRN

## 2021-07-09 NOTE — Progress Notes (Addendum)
? ?History:  ? Colleen Wells is a 36 y.o. [id number] at [redacted]w[redacted]d by LMP being seen today for her first obstetrical visit.  Her obstetrical history is significant for advanced maternal age and previous C/S x5. Hx of gestational diabetes with 2 previous pregnancies . Patient does not intend to breast feed. Pregnancy history fully reviewed. ? ?Patient reports nausea without vomiting. She endorses some intermittent breast tenderness. Expecting baby boy. Planning [facility]. Feeling some "flutters." Desires PP BTL.  ?  ? ?  ?HISTORY: ?OB History  ?Gravida Para Term Preterm AB Living  ?8 5 5  0 1 5  ?SAB IAB Ectopic Multiple Live Births  ?1 0 0 0 5  ?  ?# Outcome Date GA Lbr Len/2nd Weight Sex Delivery Anes PTL Lv  ?8 Current           ?7 Term 01/29/19 [redacted]w[redacted]d  8 lb 7.1 oz (3.83 kg) F CS-LTranv Spinal  LIV  ?   Name: LAWRENCIA, DEATS  ?   Apgar1: 8  Apgar5: 9  ?6 Term 2014     CS-Unspec     ?5 Term 2012     CS-Unspec     ?4 SAB 2008          ?3 Term 2008     CS-Unspec     ?2 Term 2006     CS-Unspec   LIV  ?1 Gravida           ?  ?Last pap smear was done 06/23/21 and was abnormal - HPV positive, without cytology.  ? ?Past Medical History:  ?Diagnosis Date  ? Arthrofibrosis of knee joint   ? right  ? Gestational diabetes   ? Meniscus tear   ? Prediabetes 12/26/2020  ? Pregnancy induced hypertension   ? ?Past Surgical History:  ?Procedure Laterality Date  ? ANTERIOR CRUCIATE LIGAMENT REPAIR Right 09/03/2015  ? Procedure: REPAIR ANTERIOR CRUCIATE LIGAMENT  REPAIR OF AVULSION;  Surgeon: Meredith Pel, MD;  Location: [facility];  Service: Orthopedics;  Laterality: Right;  ? CESAREAN SECTION    ? x4  ? CESAREAN SECTION N/A 01/29/2019  ? Procedure: CESAREAN SECTION;  Surgeon: [name], MD;  [facility];  Service: Obstetrics;  Laterality: N/A;  ? KNEE CLOSED REDUCTION Right 10/08/2015  ? Procedure: CLOSED MANIPULATION KNEE UNDER ANESTHESIA;  Surgeon: Meredith Pel, MD;  Location: [facility];  Service: Orthopedics;   Laterality: Right;  ? MENISCUS REPAIR Right 09/03/2015  ? Procedure: REPAIR OF MENISCUS LATERAL;  Surgeon: Meredith Pel, MD;  Location: [facility];  Service: Orthopedics;  Laterality: Right;  ? ?Family History  ?Problem Relation Age of Onset  ? Healthy Mother   ? Healthy Father   ? ?Social History  ? ?Tobacco Use  ? Smoking status: Never  ? Smokeless tobacco: Never  ?Vaping Use  ? Vaping Use: Never used  ?Substance Use Topics  ? Alcohol use: No  ? Drug use: No  ? ?No Known Allergies ?Current Outpatient Medications on File Prior to Visit  ?Medication Sig Dispense Refill  ? metroNIDAZOLE (FLAGYL) 500 MG tablet Take 1 tablet (500 mg total) by mouth 2 (two) times daily. 14 tablet 0  ? ?No current facility-administered medications on file prior to visit.  ? ? ?Review of Systems ?Pertinent items noted in HPI and remainder of comprehensive ROS otherwise negative. ?Physical Exam:  ? ?Vitals:  ? 07/09/21 1423  ?BP: 130/73  ?Pulse: 88  ?Weight: 201 lb 11.2 oz (91.5 kg)  ? ?  Fetal Heart Rate (bpm): 161 via doppler  ? ?Constitutional: Well-developed, well-nourished pregnant female in no acute distress.  ?HEENT: PERRLA ?Skin: normal color and turgor, no rash ?Cardiovascular: normal rate & rhythm, no murmur ?Respiratory: normal effort, lung sounds clear throughout ?GI: Abd soft, non-tender, pos BS x 4, gravid appropriate for gestational age ?MS: Extremities nontender, no edema, normal ROM ?Neurologic: Alert and oriented x 4.  ?GU: no CVA tenderness ?Pelvic: deferred.  ? ? ?Assessment:  ?  ?Pregnancy: [id number] ?Patient Active Problem List  ? Diagnosis Date Noted  ? Supervision of high risk pregnancy, antepartum 07/09/2021  ? Prediabetes 12/26/2020  ? COVID-19 affecting pregnancy in third trimester 01/30/2019  ? Post-operative state 01/29/2019  ? Genetic carrier 01/16/2019  ? History of C-section 11/29/2018  ? ?  ?Plan:  ?  ?1. Supervision of high risk pregnancy in first trimester ?- Feeling flutters.  ?- [place] MFM OB DETAIL +14 WK;  Future ?- Hemoglobin A1c ?- Pap 06/23/21, HPV+ per [facility] repeat testing in 1 year.  ?Cervicovaginal ancillary only( [place]) ? ?2. History of C-section ?- Discussed repeat C/S with patient with 5 previous C/S.  ? ?3. Prediabetes ?- [facility] colleted a  GTT 2 weeks ago: 76 ?- Concern for Diabetes  ?- HbgA1c collected today ?- F/U next visit  ?-- Ambulatory referral to Nutrition and Diabetic Education ? ?4. Initial obstetric visit, antepartum ?- Discussed BTL with patient today for contraception postpartum. ?- Positive for Trich 06/23/21 with tx. TOC collected today    ? ?Continue prenatal vitamins. ?Problem list reviewed and updated. ?Genetic Screening completed at [facility]  ?Ultrasound discussed; fetal anatomic survey: ordered. ?Anticipatory guidance about prenatal visits given including labs, ultrasounds, and testing. ?Encouraged to complete MyChart Registration for her ability to review results, send requests, and have questions addressed.  ?The nature of [place] for [facility] with multiple MDs and Advanced Practice Providers was explained to patient; also emphasized that residents, students are part of our team. ?Routine obstetric precautions reviewed. Encouraged to seek out care at office or emergency room [facility] preferred) for urgent and/or emergent concerns. ? ?Return in about 4 weeks (around 08/06/2021) for [place].  ?  ? ?Arnisha Laffoon Isaias Sakai) Rollene Rotunda, MSN, CNM ?Certified Nurse Midwife  ?[place]  ? ? ?

## 2021-07-10 LAB — HEMOGLOBIN A1C
Est. average glucose Bld gHb Est-mCnc: 123 mg/dL
Hgb A1c MFr Bld: 5.9 % — ABNORMAL HIGH (ref 4.8–5.6)

## 2021-07-11 LAB — CERVICOVAGINAL ANCILLARY ONLY
Bacterial Vaginitis (gardnerella): NEGATIVE
Candida Glabrata: NEGATIVE
Candida Vaginitis: NEGATIVE
Chlamydia: NEGATIVE
Comment: NEGATIVE
Comment: NEGATIVE
Comment: NEGATIVE
Comment: NEGATIVE
Comment: NEGATIVE
Comment: NORMAL
Neisseria Gonorrhea: NEGATIVE
Trichomonas: NEGATIVE

## 2021-07-12 DIAGNOSIS — Z1152 Encounter for screening for COVID-19: Secondary | ICD-10-CM | POA: Diagnosis not present

## 2021-07-15 ENCOUNTER — Telehealth: Payer: Self-pay | Admitting: Family Medicine

## 2021-07-15 NOTE — Telephone Encounter (Signed)
Patient state she was here on 5-17 and her RX was not called in.

## 2021-07-16 MED ORDER — ACCU-CHEK SOFTCLIX LANCETS MISC: 12 refills | Status: DC

## 2021-07-16 MED ORDER — ACCU-CHEK GUIDE VI STRP: ORAL_STRIP | 12 refills | Status: DC

## 2021-07-16 MED ORDER — ACCU-CHEK GUIDE W/DEVICE KIT: 1.0000 | PACK | Freq: Four times a day (QID) | 0 refills | Status: DC

## 2021-07-16 NOTE — Telephone Encounter (Signed)
Returned pt's call and pt informed me that she was suppose to have an Rx for glucometer, lancets, and strips because she had an elevated 1 hr gtt.  Pt advised that her supplies have been sent to [facility] on [address].  Pt verbalized understanding with no further questions.   Leonette Nutting  07/16/21

## 2021-07-17 ENCOUNTER — Telehealth: Payer: Self-pay | Admitting: Lactation Services

## 2021-07-17 NOTE — Telephone Encounter (Signed)
Called and spoke with patient in regards to [employer] Screening showing that SMA carrier status did not result. Recommends redraw. Unable to reach patient, LM for her to check her My Chart and to answer back or call the office at her convenience for results.

## 2021-07-23 ENCOUNTER — Encounter: Payer: Medicaid Other | Attending: Certified Nurse Midwife | Admitting: Registered"

## 2021-08-11 ENCOUNTER — Ambulatory Visit (INDEPENDENT_AMBULATORY_CARE_PROVIDER_SITE_OTHER): Payer: Medicaid Other | Admitting: Obstetrics & Gynecology

## 2021-08-11 VITALS — BP 113/56 | HR 100 | Wt 206.0 lb

## 2021-08-11 DIAGNOSIS — Z98891 History of uterine scar from previous surgery: Secondary | ICD-10-CM | POA: Diagnosis not present

## 2021-08-11 DIAGNOSIS — O24919 Unspecified diabetes mellitus in pregnancy, unspecified trimester: Secondary | ICD-10-CM | POA: Insufficient documentation

## 2021-08-11 DIAGNOSIS — O24819 Other pre-existing diabetes mellitus in pregnancy, unspecified trimester: Secondary | ICD-10-CM | POA: Diagnosis not present

## 2021-08-11 DIAGNOSIS — O0990 Supervision of high risk pregnancy, unspecified, unspecified trimester: Secondary | ICD-10-CM

## 2021-08-11 NOTE — Progress Notes (Signed)
   PRENATAL VISIT NOTE  Subjective:  Colleen Wells is a 36 y.o. [id number] at [redacted]w[redacted]d being seen today for ongoing prenatal care.  She is currently monitored for the following issues for this high-risk pregnancy and has History of C-section; Genetic carrier; COVID-19 affecting pregnancy in third trimester; Prediabetes; Supervision of high risk pregnancy, antepartum; and Diabetes mellitus in pregnancy on their problem list.    Contractions: Not present. Vag. Bleeding: None.  Movement: Absent. Denies leaking of fluid.   The following portions of the patient's history were reviewed and updated as appropriate: allergies, current medications, past family history, past medical history, past social history, past surgical history and problem list.   Objective:   Vitals:   08/11/21 0928  BP: (!) 113/56  Pulse: 100  Weight: 206 lb (93.4 kg)    Fetal Status: Fetal Heart Rate (bpm): 156   Movement: Absent     General:  Alert, oriented and cooperative. Patient is in no acute distress.  Skin: Skin is warm and dry. No rash noted.   Cardiovascular: Normal heart rate noted  Respiratory: Normal respiratory effort, no problems with respiration noted  Abdomen: Soft, gravid, appropriate for gestational age.  Pain/Pressure: Absent     Pelvic: Cervical exam deferred        Extremities: Normal range of motion.  Edema: None  Mental Status: Normal mood and affect. Normal behavior. Normal judgment and thought content.   Assessment and Plan:  Pregnancy: [id number] at [redacted]w[redacted]d 1. Supervision of high risk pregnancy, antepartum screening - AFP, Serum, Open Spina Bifida  2. History of C-section Plans RCS - AFP, Serum, Open Spina Bifida  3. Other pre-existing diabetes mellitus during pregnancy, antepartum ASA 81 mg - AFP, Serum, Open Spina Bifida - US Fetal Echocardiography; Future  Preterm labor symptoms and general obstetric precautions including but not limited to vaginal bleeding, contractions, leaking of  fluid and fetal movement were reviewed in detail with the patient. Please refer to After Visit Summary for other counseling recommendations.   Return in about 4 weeks (around 09/08/2021).  Future Appointments  Date Time Provider Department Center  08/29/2021 11:30 AM [facility] US2 [facility]  09/08/2021  8:15 AM Adam Phenix, MD [facility]  10/07/2021  9:15 AM Marny Lowenstein, PA-C [facility]    Scheryl Darter, MD

## 2021-08-12 DIAGNOSIS — Z1152 Encounter for screening for COVID-19: Secondary | ICD-10-CM | POA: Diagnosis not present

## 2021-08-13 LAB — AFP, SERUM, OPEN SPINA BIFIDA
AFP MoM: 1.67
AFP Value: 55.1 ng/mL
Gest. Age on Collection Date: 16.4 weeks
Maternal Age At EDD: 38.6 yr
OSBR Risk 1 IN: 3514
Test Results:: NEGATIVE
Weight: 206 [lb_av]

## 2021-08-19 ENCOUNTER — Encounter: Payer: Medicaid Other | Attending: Certified Nurse Midwife | Admitting: Registered"

## 2021-08-19 ENCOUNTER — Ambulatory Visit (INDEPENDENT_AMBULATORY_CARE_PROVIDER_SITE_OTHER): Payer: Medicaid Other | Admitting: Registered"

## 2021-08-19 ENCOUNTER — Other Ambulatory Visit: Payer: Self-pay

## 2021-08-19 DIAGNOSIS — O0991 Supervision of high risk pregnancy, unspecified, first trimester: Secondary | ICD-10-CM | POA: Diagnosis not present

## 2021-08-19 DIAGNOSIS — R7303 Prediabetes: Secondary | ICD-10-CM | POA: Insufficient documentation

## 2021-08-19 DIAGNOSIS — Z713 Dietary counseling and surveillance: Secondary | ICD-10-CM | POA: Insufficient documentation

## 2021-08-19 DIAGNOSIS — O24819 Other pre-existing diabetes mellitus in pregnancy, unspecified trimester: Secondary | ICD-10-CM

## 2021-08-19 NOTE — Progress Notes (Signed)
Patient was seen for Gestational Diabetes self-management on 08/19/21  Start time 1525 and End time 1628  Estimated due date: 01/23/22; [redacted]w[redacted]d  Clinical: Medications: reviewed Medical History: pre-diabetes, GDM  Labs: OGTT 1 hr GTT 194 mg/dL, W2N 5.6%   Dietary and Lifestyle History: Pt states she had GDM with last pregnancy and is concerned because it is showing up much earlier this time. Pt is concerned about affects on baby  Pt states she likes rice, early in pregnancy ate a lot of potatoes.   Physical Activity: limited due to heart racing easily and discomfort. Has pregnancy belt that helps Stress: not assessed Sleep: not enough, has a hard time getting comfortable  24 hr Recall: not fully assessed First Meal: Snack: Second meal: Snack: Third meal: Snack: Beverages: water, diet drinks  NUTRITION INTERVENTION  Nutrition education (E-1) on the following topics:   Initial Follow-up  [x]  []  Definition of Gestational Diabetes [x]  []  Why dietary management is important in controlling blood glucose [x]  []  Effects each nutrient has on blood glucose levels [x]  []  Simple carbohydrates vs complex carbohydrates [x]  []  Fluid intake [x]  []  Creating a balanced meal plan [x]  []  Carbohydrate counting  [x]  []  When to check blood glucose levels [x]  []  Proper blood glucose monitoring techniques [x]  []  Effect of stress and stress reduction techniques  [x]  []  Exercise effect on blood glucose levels, appropriate exercise during pregnancy [x]  []  Importance of limiting caffeine and abstaining from alcohol and smoking [x]  []  Medications used for blood sugar control during pregnancy [x]  []  Hypoglycemia and rule of 15 [x]  []  Postpartum self care  Patient already has a meter, is testing pre breakfast and a hours after each meal. Pt reports the following: FBS: 101 mg/dL Postprandial: 1 hr ~213 mg/dL  Patient instructed to monitor glucose levels: FBS: 60 - ? 95 mg/dL (some clinics use 90  for cutoff) 1 hour: ? 140 mg/dL 2 hour: ? 086 mg/dL  Patient received handouts: Nutrition Diabetes and Pregnancy Carbohydrate Counting List  Patient will be seen for follow-up as needed.

## 2021-08-29 ENCOUNTER — Ambulatory Visit: Payer: Medicaid Other | Admitting: *Deleted

## 2021-08-29 ENCOUNTER — Other Ambulatory Visit: Payer: Self-pay | Admitting: *Deleted

## 2021-08-29 ENCOUNTER — Encounter: Payer: Self-pay | Admitting: *Deleted

## 2021-08-29 ENCOUNTER — Ambulatory Visit: Payer: Medicaid Other | Attending: Certified Nurse Midwife

## 2021-08-29 ENCOUNTER — Ambulatory Visit (HOSPITAL_BASED_OUTPATIENT_CLINIC_OR_DEPARTMENT_OTHER): Payer: Medicaid Other | Admitting: Obstetrics

## 2021-08-29 VITALS — BP 137/72 | HR 104

## 2021-08-29 DIAGNOSIS — O0991 Supervision of high risk pregnancy, unspecified, first trimester: Secondary | ICD-10-CM | POA: Diagnosis not present

## 2021-08-29 DIAGNOSIS — O24410 Gestational diabetes mellitus in pregnancy, diet controlled: Secondary | ICD-10-CM | POA: Diagnosis not present

## 2021-08-29 DIAGNOSIS — O09522 Supervision of elderly multigravida, second trimester: Secondary | ICD-10-CM

## 2021-08-29 DIAGNOSIS — Z3A19 19 weeks gestation of pregnancy: Secondary | ICD-10-CM | POA: Insufficient documentation

## 2021-08-29 DIAGNOSIS — O0990 Supervision of high risk pregnancy, unspecified, unspecified trimester: Secondary | ICD-10-CM | POA: Insufficient documentation

## 2021-08-29 DIAGNOSIS — O24419 Gestational diabetes mellitus in pregnancy, unspecified control: Secondary | ICD-10-CM

## 2021-08-29 NOTE — Progress Notes (Signed)
MFM Note  Colleen Wells was seen for a detailed fetal anatomy scan due to advanced maternal age.  Colleen Wells was recently diagnosed with diet-controlled gestational diabetes.  Colleen Wells had a cell free DNA test earlier in her pregnancy which indicated a low risk for trisomy 44, 34, and 13. A female fetus is predicted.   Colleen Wells was informed that the fetal growth and amniotic fluid level were appropriate for her gestational age.   There were no obvious fetal anomalies noted on today's ultrasound exam.  The patient was informed that anomalies may be missed due to technical limitations. If the fetus is in a suboptimal position or maternal habitus is increased, visualization of the fetus in the maternal uterus may be impaired.  The following were discussed during today's consultation:  Gestational diabetes  The implications and management of diabetes in pregnancy was discussed in detail with the patient.    Colleen Wells was advised to continue to monitor her fingersticks 4 times daily (fasting and 2 hours after each meal).    Colleen Wells was advised that our goals for her fingerstick values are fasting values of 90-95 or less and two-hour postprandial values of 120 or less.    Should the majority of her fingerstick results be above these values, Colleen Wells may have to be started on metformin or insulin to help her achieve better glycemic control.   The patient was advised that getting her fingerstick values as close to these goals as possible would provide her with the most optimal obstetrical outcome.  The increased risk of polyhydramnios, fetal macrosomia, and preeclampsia associated with diabetes was also discussed.    The patient was advised that delivery for well-controlled diabetes in pregnancy is usually recommended at around 39 weeks.  Delivery at 37 weeks may be considered should her glycemic control be poor.  Advanced maternal age in pregnancy  The increased risk of fetal aneuploidy due to advanced maternal age was  discussed.   Due to advanced maternal age, the patient was offered and declined an amniocentesis today for definitive diagnosis of fetal aneuploidy.  Colleen Wells reports that Colleen Wells is comfortable with her negative cell free DNA test.  A follow-up growth scan was scheduled in 4 weeks.  The patient stated that all of her questions have been answered to her satisfaction.    A total of 30 minutes was spent counseling and coordinating the care for this patient.  Greater than 50% of the time was spent in direct face-to-face contact.

## 2021-09-02 ENCOUNTER — Encounter (HOSPITAL_COMMUNITY): Payer: Self-pay | Admitting: Obstetrics and Gynecology

## 2021-09-02 ENCOUNTER — Other Ambulatory Visit: Payer: Self-pay

## 2021-09-02 ENCOUNTER — Inpatient Hospital Stay (HOSPITAL_COMMUNITY)
Admission: AD | Admit: 2021-09-02 | Discharge: 2021-09-02 | Disposition: A | Discharge status: Home / Self Care | Payer: Medicaid Other | Attending: Obstetrics and Gynecology | Admitting: Obstetrics and Gynecology

## 2021-09-02 DIAGNOSIS — R0989 Other specified symptoms and signs involving the circulatory and respiratory systems: Secondary | ICD-10-CM | POA: Diagnosis not present

## 2021-09-02 DIAGNOSIS — Z3A19 19 weeks gestation of pregnancy: Secondary | ICD-10-CM | POA: Diagnosis not present

## 2021-09-02 DIAGNOSIS — O26892 Other specified pregnancy related conditions, second trimester: Secondary | ICD-10-CM | POA: Diagnosis not present

## 2021-09-02 DIAGNOSIS — Z3492 Encounter for supervision of normal pregnancy, unspecified, second trimester: Secondary | ICD-10-CM

## 2021-09-02 DIAGNOSIS — O0990 Supervision of high risk pregnancy, unspecified, unspecified trimester: Secondary | ICD-10-CM

## 2021-09-02 NOTE — MAU Provider Note (Signed)
  S Ms. Colleen Wells is a 36 y.o. [phone] pregnant female at [redacted]w[redacted]d who presents to MAU today with complaint of throat tightness that woke her up at 0500 this morning. States she woke up feeling like she was choking and feeling of tightness/closing has remained. No soreness, congestion or feeling like something is stuck in her throat, just that her whole throat is tight. This has happened before in Nov when she took old antibiotics, her MD just gave her new antibiotics. No other physical complaints.   Receives care at [facility]. Prenatal records reviewed. New diagnosis of pre-existing diabetes.  Pertinent items noted in HPI and remainder of comprehensive ROS otherwise negative.   O BP 118/70 (BP Location: Right Arm)   Pulse 90   Temp 98.6 F (37 C) (Oral)   Resp 20   Ht 5\' 8"  (1.727 m)   Wt 210 lb 11.2 oz (95.6 kg)   LMP 04/18/2021   SpO2 100%   BMI 32.04 kg/m  Physical Exam Vitals and nursing note reviewed.  Constitutional:      General: She is not in acute distress.    Appearance: Normal appearance.  HENT:     Head: Normocephalic and atraumatic.  Eyes:     Pupils: Pupils are equal, round, and reactive to light.  Cardiovascular:     Rate and Rhythm: Normal rate and regular rhythm.     Pulses: Normal pulses.  Pulmonary:     Effort: Pulmonary effort is normal.     Breath sounds: Normal breath sounds.  Musculoskeletal:        General: Normal range of motion.  Skin:    General: Skin is warm and dry.     Capillary Refill: Capillary refill takes less than 2 seconds.  Neurological:     Mental Status: She is alert and oriented to person, place, and time.  Psychiatric:        Mood and Affect: Mood normal.        Behavior: Behavior normal.        Thought Content: Thought content normal.        Judgment: Judgment normal.    MDM/MAU Course: Vitals stable, able to move air and swallow, not coughing. FHT: 150 with palpable movement. Advised pt she needs to be evaluated by the  main ED, or one at [facility]. Pt chose [facility], directions given.   A Throat tightness Medical screening exam complete  P Discharge from MAU in stable condition with 2nd trimester precautions Pt to go to [facility] for evaluation of throat tightness   [facility], Bernerd Limbo 09/02/2021 6:02 AM

## 2021-09-02 NOTE — MAU Note (Addendum)
Pt says she feels like her throat is closing - awoke at 0500- with throat feeling swollen- and blocking throat. Throat not sore Has not eaten anything allergic to  Does not feel congested  Happened again in Nov when she took old antibiotics - went Urgent Care - told her tonsils were swollen and gave new Rx for antx-  then all well.   Feels same now  Tonight - only took ASA and PNV

## 2021-09-07 DIAGNOSIS — Z1152 Encounter for screening for COVID-19: Secondary | ICD-10-CM | POA: Diagnosis not present

## 2021-09-08 ENCOUNTER — Other Ambulatory Visit: Payer: Self-pay

## 2021-09-08 ENCOUNTER — Ambulatory Visit (INDEPENDENT_AMBULATORY_CARE_PROVIDER_SITE_OTHER): Payer: Medicaid Other | Admitting: Obstetrics & Gynecology

## 2021-09-08 VITALS — BP 120/79 | HR 92 | Wt 211.0 lb

## 2021-09-08 DIAGNOSIS — Z98891 History of uterine scar from previous surgery: Secondary | ICD-10-CM

## 2021-09-08 DIAGNOSIS — O0990 Supervision of high risk pregnancy, unspecified, unspecified trimester: Secondary | ICD-10-CM

## 2021-09-08 DIAGNOSIS — O24812 Other pre-existing diabetes mellitus in pregnancy, second trimester: Secondary | ICD-10-CM

## 2021-09-08 DIAGNOSIS — Z3A20 20 weeks gestation of pregnancy: Secondary | ICD-10-CM

## 2021-09-08 DIAGNOSIS — O0992 Supervision of high risk pregnancy, unspecified, second trimester: Secondary | ICD-10-CM

## 2021-09-08 MED ORDER — METFORMIN HCL 500 MG PO TABS: 500.0000 mg | ORAL_TABLET | Freq: Every day | ORAL | 5 refills | Status: DC

## 2021-09-08 NOTE — Progress Notes (Unsigned)
Patient stated that she did not ring glucose log do to her "not knowing where it is"

## 2021-09-08 NOTE — Progress Notes (Unsigned)
   PRENATAL VISIT NOTE  Subjective:  Colleen Wells is a 36 y.o. [id number] at [redacted]w[redacted]d being seen today for ongoing prenatal care.  She is currently monitored for the following issues for this {Blank single:19197::"high-risk","low-risk"} pregnancy and has History of C-section; Genetic carrier; COVID-19 affecting pregnancy in third trimester; Supervision of high risk pregnancy, antepartum; and Diabetes mellitus in pregnancy on their problem list.  Patient reports no complaints.  Contractions: Not present. Vag. Bleeding: None.  Movement: Present. Denies leaking of fluid.   The following portions of the patient's history were reviewed and updated as appropriate: allergies, current medications, past family history, past medical history, past social history, past surgical history and problem list.   Objective:   Vitals:   09/08/21 0840  BP: 120/79  Pulse: 92  Weight: 211 lb (95.7 kg)    Fetal Status: Fetal Heart Rate (bpm): 150   Movement: Present     General:  Alert, oriented and cooperative. Patient is in no acute distress.  Skin: Skin is warm and dry. No rash noted.   Cardiovascular: Normal heart rate noted  Respiratory: Normal respiratory effort, no problems with respiration noted  Abdomen: Soft, gravid, appropriate for gestational age.  Pain/Pressure: Absent     Pelvic: Cervical exam deferred        Extremities: Normal range of motion.  Edema: None  Mental Status: Normal mood and affect. Normal behavior. Normal judgment and thought content.   Assessment and Plan:  Pregnancy: [id number] at [redacted]w[redacted]d 1. Supervision of high risk pregnancy, antepartum Needs [place] f/u and fetal echo  2. History of C-section Plans BTL  3. Other pre-existing diabetes mellitus during pregnancy in second trimester FBS up to 134, will add Metformin  Preterm labor symptoms and general obstetric precautions including but not limited to vaginal bleeding, contractions, leaking of fluid and fetal movement were reviewed  in detail with the patient. Please refer to After Visit Summary for other counseling recommendations.   Return in about 3 weeks (around 09/29/2021).  Future Appointments  Date Time Provider Department Center  09/26/2021 11:15 AM [facility] NURSE [facility]  09/26/2021 11:30 AM [facility] US2 [facility]  10/07/2021  9:15 AM Magnus Sinning, Sherryll Burger [facility]    Scheryl Darter, MD

## 2021-09-24 DIAGNOSIS — R7303 Prediabetes: Secondary | ICD-10-CM | POA: Diagnosis not present

## 2021-09-24 DIAGNOSIS — Z1159 Encounter for screening for other viral diseases: Secondary | ICD-10-CM | POA: Diagnosis not present

## 2021-09-24 DIAGNOSIS — Z0130 Encounter for examination of blood pressure without abnormal findings: Secondary | ICD-10-CM | POA: Diagnosis not present

## 2021-09-24 DIAGNOSIS — J029 Acute pharyngitis, unspecified: Secondary | ICD-10-CM | POA: Diagnosis not present

## 2021-09-24 DIAGNOSIS — Z6832 Body mass index (BMI) 32.0-32.9, adult: Secondary | ICD-10-CM | POA: Diagnosis not present

## 2021-09-24 DIAGNOSIS — Z789 Other specified health status: Secondary | ICD-10-CM | POA: Diagnosis not present

## 2021-09-24 DIAGNOSIS — D509 Iron deficiency anemia, unspecified: Secondary | ICD-10-CM | POA: Diagnosis not present

## 2021-09-24 DIAGNOSIS — Z3490 Encounter for supervision of normal pregnancy, unspecified, unspecified trimester: Secondary | ICD-10-CM | POA: Diagnosis not present

## 2021-09-25 DIAGNOSIS — Z1152 Encounter for screening for COVID-19: Secondary | ICD-10-CM | POA: Diagnosis not present

## 2021-09-26 ENCOUNTER — Ambulatory Visit: Payer: Medicaid Other | Attending: Certified Nurse Midwife

## 2021-09-26 ENCOUNTER — Ambulatory Visit: Payer: Medicaid Other | Admitting: *Deleted

## 2021-09-26 ENCOUNTER — Other Ambulatory Visit: Payer: Self-pay | Admitting: *Deleted

## 2021-09-26 VITALS — BP 117/67 | HR 98

## 2021-09-26 DIAGNOSIS — O99212 Obesity complicating pregnancy, second trimester: Secondary | ICD-10-CM

## 2021-09-26 DIAGNOSIS — O34219 Maternal care for unspecified type scar from previous cesarean delivery: Secondary | ICD-10-CM

## 2021-09-26 DIAGNOSIS — O0942 Supervision of pregnancy with grand multiparity, second trimester: Secondary | ICD-10-CM

## 2021-09-26 DIAGNOSIS — O09522 Supervision of elderly multigravida, second trimester: Secondary | ICD-10-CM | POA: Diagnosis not present

## 2021-09-26 DIAGNOSIS — Z1152 Encounter for screening for COVID-19: Secondary | ICD-10-CM | POA: Diagnosis not present

## 2021-09-26 DIAGNOSIS — Z3A23 23 weeks gestation of pregnancy: Secondary | ICD-10-CM

## 2021-09-26 DIAGNOSIS — E669 Obesity, unspecified: Secondary | ICD-10-CM

## 2021-09-26 DIAGNOSIS — O24419 Gestational diabetes mellitus in pregnancy, unspecified control: Secondary | ICD-10-CM | POA: Diagnosis not present

## 2021-09-26 DIAGNOSIS — O0990 Supervision of high risk pregnancy, unspecified, unspecified trimester: Secondary | ICD-10-CM | POA: Diagnosis not present

## 2021-09-26 DIAGNOSIS — O24414 Gestational diabetes mellitus in pregnancy, insulin controlled: Secondary | ICD-10-CM

## 2021-09-26 DIAGNOSIS — Z148 Genetic carrier of other disease: Secondary | ICD-10-CM | POA: Diagnosis not present

## 2021-09-26 DIAGNOSIS — O24415 Gestational diabetes mellitus in pregnancy, controlled by oral hypoglycemic drugs: Secondary | ICD-10-CM | POA: Diagnosis not present

## 2021-09-26 DIAGNOSIS — O285 Abnormal chromosomal and genetic finding on antenatal screening of mother: Secondary | ICD-10-CM | POA: Diagnosis not present

## 2021-10-07 ENCOUNTER — Encounter: Payer: Self-pay | Admitting: Medical

## 2021-10-07 ENCOUNTER — Ambulatory Visit (INDEPENDENT_AMBULATORY_CARE_PROVIDER_SITE_OTHER): Payer: Medicaid Other | Admitting: Medical

## 2021-10-07 ENCOUNTER — Other Ambulatory Visit: Payer: Self-pay

## 2021-10-07 VITALS — BP 124/72 | HR 105 | Wt 215.0 lb

## 2021-10-07 DIAGNOSIS — Z148 Genetic carrier of other disease: Secondary | ICD-10-CM

## 2021-10-07 DIAGNOSIS — Z98891 History of uterine scar from previous surgery: Secondary | ICD-10-CM

## 2021-10-07 DIAGNOSIS — Z3A24 24 weeks gestation of pregnancy: Secondary | ICD-10-CM

## 2021-10-07 DIAGNOSIS — O24812 Other pre-existing diabetes mellitus in pregnancy, second trimester: Secondary | ICD-10-CM

## 2021-10-07 DIAGNOSIS — O0990 Supervision of high risk pregnancy, unspecified, unspecified trimester: Secondary | ICD-10-CM

## 2021-10-07 NOTE — Progress Notes (Unsigned)
Patient stated that ever since she's been taking Metformin Rx, she's been having panic attacks. Last time she took Metformin was July 27.

## 2021-10-08 DIAGNOSIS — J351 Hypertrophy of tonsils: Secondary | ICD-10-CM | POA: Diagnosis not present

## 2021-10-08 DIAGNOSIS — Z789 Other specified health status: Secondary | ICD-10-CM | POA: Diagnosis not present

## 2021-10-08 DIAGNOSIS — R030 Elevated blood-pressure reading, without diagnosis of hypertension: Secondary | ICD-10-CM | POA: Diagnosis not present

## 2021-10-08 DIAGNOSIS — Z3490 Encounter for supervision of normal pregnancy, unspecified, unspecified trimester: Secondary | ICD-10-CM | POA: Diagnosis not present

## 2021-10-08 DIAGNOSIS — Z0130 Encounter for examination of blood pressure without abnormal findings: Secondary | ICD-10-CM | POA: Diagnosis not present

## 2021-10-08 DIAGNOSIS — E611 Iron deficiency: Secondary | ICD-10-CM | POA: Diagnosis not present

## 2021-10-08 DIAGNOSIS — E781 Pure hyperglyceridemia: Secondary | ICD-10-CM | POA: Diagnosis not present

## 2021-10-08 DIAGNOSIS — Z6832 Body mass index (BMI) 32.0-32.9, adult: Secondary | ICD-10-CM | POA: Diagnosis not present

## 2021-10-08 NOTE — Progress Notes (Signed)
   PRENATAL VISIT NOTE  Subjective:  Colleen Wells is a 36 y.o. [id number] at [redacted]w[redacted]d being seen today for ongoing prenatal care.  She is currently monitored for the following issues for this high-risk pregnancy and has History of C-section; Genetic carrier; Supervision of high risk pregnancy, antepartum; and Diabetes mellitus in pregnancy on their problem list.  Patient reports panic attacks.  Contractions: Not present. Vag. Bleeding: None.  Movement: Present. Denies leaking of fluid.   The following portions of the patient's history were reviewed and updated as appropriate: allergies, current medications, past family history, past medical history, past social history, past surgical history and problem list.   Objective:   Vitals:   10/07/21 0922  BP: 124/72  Pulse: (!) 105  Weight: 215 lb (97.5 kg)    Fetal Status: Fetal Heart Rate (bpm): 132 Fundal Height: 25 cm Movement: Present     General:  Alert, oriented and cooperative. Patient is in no acute distress.  Skin: Skin is warm and dry. No rash noted.   Cardiovascular: Normal heart rate noted  Respiratory: Normal respiratory effort, no problems with respiration noted  Abdomen: Soft, gravid, appropriate for gestational age.  Pain/Pressure: Absent     Pelvic: Cervical exam deferred        Extremities: Normal range of motion.  Edema: None  Mental Status: Normal mood and affect. Normal behavior. Normal judgment and thought content.   Assessment and Plan:  Pregnancy: [id number] at [redacted]w[redacted]d 1. Supervision of high risk pregnancy, antepartum  2. Other pre-existing diabetes mellitus during pregnancy in second trimester - Stopped Metformin, thought to be causing panic attacks, however also saw Urgent Care and was diagnosed and treated for a throat infection, symptoms have been resolved for 1 week since that treatment, will continue to monitor  - CBG log reviewed, mostly normal, only occasionally, mildly elevated, no changes today  - [place] 8/4 EFW  91%, FU growth scheduled 9/8 - Fetal echo scheduled   3. Genetic carrier - SMA carrier   4. History of C-section - C/S x 6, will need repeat scheduled   5. [redacted] weeks gestation of pregnancy  Preterm labor symptoms and general obstetric precautions including but not limited to vaginal bleeding, contractions, leaking of fluid and fetal movement were reviewed in detail with the patient. Please refer to After Visit Summary for other counseling recommendations.   Return in about 4 weeks (around 11/04/2021) for [facility] APP, any provider, In-Person, 28 week labs (fasting).  Future Appointments  Date Time Provider Department Center  10/31/2021 11:15 AM [facility] NURSE [facility]  10/31/2021 11:30 AM [facility] US3 [facility]  11/05/2021  1:15 PM Venora Maples, MD [facility]    Vonzella Nipple, PA-C

## 2021-10-20 DIAGNOSIS — Z1152 Encounter for screening for COVID-19: Secondary | ICD-10-CM | POA: Diagnosis not present

## 2021-10-24 DIAGNOSIS — Z1152 Encounter for screening for COVID-19: Secondary | ICD-10-CM | POA: Diagnosis not present

## 2021-10-31 ENCOUNTER — Encounter: Payer: Self-pay | Admitting: *Deleted

## 2021-10-31 ENCOUNTER — Ambulatory Visit: Payer: Medicaid Other | Attending: Obstetrics and Gynecology

## 2021-10-31 ENCOUNTER — Other Ambulatory Visit: Payer: Self-pay | Admitting: *Deleted

## 2021-10-31 ENCOUNTER — Ambulatory Visit (HOSPITAL_BASED_OUTPATIENT_CLINIC_OR_DEPARTMENT_OTHER): Payer: Medicaid Other | Admitting: Maternal & Fetal Medicine

## 2021-10-31 ENCOUNTER — Ambulatory Visit: Payer: Medicaid Other | Admitting: *Deleted

## 2021-10-31 VITALS — BP 109/61 | HR 96

## 2021-10-31 DIAGNOSIS — Z3A28 28 weeks gestation of pregnancy: Secondary | ICD-10-CM

## 2021-10-31 DIAGNOSIS — O09523 Supervision of elderly multigravida, third trimester: Secondary | ICD-10-CM | POA: Diagnosis not present

## 2021-10-31 DIAGNOSIS — O0990 Supervision of high risk pregnancy, unspecified, unspecified trimester: Secondary | ICD-10-CM | POA: Insufficient documentation

## 2021-10-31 DIAGNOSIS — O285 Abnormal chromosomal and genetic finding on antenatal screening of mother: Secondary | ICD-10-CM

## 2021-10-31 DIAGNOSIS — O24419 Gestational diabetes mellitus in pregnancy, unspecified control: Secondary | ICD-10-CM

## 2021-10-31 DIAGNOSIS — E669 Obesity, unspecified: Secondary | ICD-10-CM | POA: Diagnosis not present

## 2021-10-31 DIAGNOSIS — O99213 Obesity complicating pregnancy, third trimester: Secondary | ICD-10-CM | POA: Diagnosis not present

## 2021-10-31 DIAGNOSIS — O24414 Gestational diabetes mellitus in pregnancy, insulin controlled: Secondary | ICD-10-CM | POA: Diagnosis not present

## 2021-10-31 DIAGNOSIS — O34219 Maternal care for unspecified type scar from previous cesarean delivery: Secondary | ICD-10-CM

## 2021-10-31 DIAGNOSIS — Z148 Genetic carrier of other disease: Secondary | ICD-10-CM

## 2021-10-31 NOTE — Progress Notes (Addendum)
MFM Consult Note Patient Name: Colleen Wells  Patient MRN:   [id number]  Referring provider: [facility]  Reason for Consult: preDM now with gDMA2  HPI: Colleen Wells is a 36 y.o. A2Z3086 at [redacted]w[redacted]d dated by LMP consistent with 19w [place] here for ultrasound and consultation.   RE preDM now with gDMA2: The patient had an elevated A1c at 5.9 and then subsequently failed her glucose tolerance test.  She was started on metformin due to persistent hyperglycemia but discontinued because she thought these were causing panic attacks.  She reports that her fasting blood sugars between 98 in the low 100s almost on every read.  She also notes that her postprandial blood sugars are in the 115's to 130s.  We discussed the potential complications on control of diabetes in pregnancy including but not limited to abnormal fetal growth, chronic fetal hypoxia resulting in brain injury or stillbirth, birth injury or birth trauma, need for cesarean delivery, post birth newborn complications such as difficulty regulating blood sugar and body temperature as well as a prolonged NICU stay.  We also discussed the importance of serial ultrasounds assessing fetal growth in addition to antenatal testing starting at 32 weeks.  We discussed delivery may likely be around 37 weeks if she continues to have signs of uncontrolled diabetes such as polyhydramnios or large for gestational age.  We discussed that babies with uncontrolled blood sugar exposure in pregnancy are prone to having heart rates of fetal abnormalities, especially the heart and limbs.  Her physician's office is currently scheduling her for a fetal echo.  I recommended that she recorded a detailed glucose log for the next week and bring this to her next appointment with her doctor.  Also discussed the importance of diet and exercise.  I messaged her doctor and they will discuss starting insulin versus glyburide.  Patient declined insulin.  She will also be referred to diabetic  education.  Review of Systems: A review of systems was performed and was negative except per HPI   Vitals Blood pressure: 114/63, Pulse: 88, Prepregnancy BMI: 29  Genetic testing: Low risk NIPS  Assessment Prediabetes with gestational diabetes, type A2 uncontrolled Plan Record glucose log with four-point blood sugars, reminded patient to bring to her next prenatal visit Recommend starting insulin if over 50% of her blood sugars are poorly controlled.  If she declines insulin glyburide would be acceptable given that she declines taking metformin. Fetal echo to be ordered by her referring physician Growth ultrasounds every month until delivery Antenatal testing to start at 32 weeks, weekly until delivery Delivery timing will depend on the type of glucose control but if she has persistent polyhydramnios, large for gestational age or uncontrolled blood sugar I recommend delivery at 37 weeks.  She will be for repeat C-section given her prior C-sections x5.  I spent 35 minutes reviewing the patients chart, including labs and images as well as counseling the patient about her medical conditions.  Braxton Feathers  MFM, [facility]   10/31/2021  12:23 PM

## 2021-11-04 ENCOUNTER — Encounter: Payer: Medicaid Other | Admitting: Family Medicine

## 2021-11-05 ENCOUNTER — Other Ambulatory Visit: Payer: Self-pay

## 2021-11-05 ENCOUNTER — Encounter: Payer: Self-pay | Admitting: Family Medicine

## 2021-11-05 ENCOUNTER — Ambulatory Visit (INDEPENDENT_AMBULATORY_CARE_PROVIDER_SITE_OTHER): Payer: Medicaid Other | Admitting: Family Medicine

## 2021-11-05 VITALS — BP 118/75 | HR 93 | Wt 210.5 lb

## 2021-11-05 DIAGNOSIS — O0993 Supervision of high risk pregnancy, unspecified, third trimester: Secondary | ICD-10-CM

## 2021-11-05 DIAGNOSIS — O99013 Anemia complicating pregnancy, third trimester: Secondary | ICD-10-CM

## 2021-11-05 DIAGNOSIS — Z3A28 28 weeks gestation of pregnancy: Secondary | ICD-10-CM

## 2021-11-05 DIAGNOSIS — Z3009 Encounter for other general counseling and advice on contraception: Secondary | ICD-10-CM | POA: Insufficient documentation

## 2021-11-05 DIAGNOSIS — Z23 Encounter for immunization: Secondary | ICD-10-CM | POA: Diagnosis not present

## 2021-11-05 DIAGNOSIS — O0990 Supervision of high risk pregnancy, unspecified, unspecified trimester: Secondary | ICD-10-CM | POA: Diagnosis not present

## 2021-11-05 DIAGNOSIS — O24813 Other pre-existing diabetes mellitus in pregnancy, third trimester: Secondary | ICD-10-CM

## 2021-11-05 DIAGNOSIS — Z98891 History of uterine scar from previous surgery: Secondary | ICD-10-CM

## 2021-11-05 DIAGNOSIS — F419 Anxiety disorder, unspecified: Secondary | ICD-10-CM | POA: Insufficient documentation

## 2021-11-05 MED ORDER — METFORMIN HCL 500 MG PO TABS: 500.0000 mg | ORAL_TABLET | Freq: Two times a day (BID) | ORAL | 5 refills | Status: DC

## 2021-11-05 NOTE — Progress Notes (Addendum)
Subjective:  Colleen Wells is a 36 y.o. G7P5015 at [redacted]w[redacted]d being seen today for ongoing prenatal care.  She is currently monitored for the following issues for this high-risk pregnancy and has History of C-section; Genetic carrier; Supervision of high risk pregnancy, antepartum; and Diabetes mellitus in pregnancy on their problem list.  Patient reports no complaints.  Contractions: Not present. Vag. Bleeding: None.  Movement: Present. Denies leaking of fluid.   The following portions of the patient's history were reviewed and updated as appropriate: allergies, current medications, past family history, past medical history, past social history, past surgical history and problem list. Problem list updated.  Objective:   Vitals:   11/05/21 1340  BP: 118/75  Pulse: 93  Weight: 210 lb 8 oz (95.5 kg)    Fetal Status: Fetal Heart Rate (bpm): 145   Movement: Present     General:  Alert, oriented and cooperative. Patient is in no acute distress.  Skin: Skin is warm and dry. No rash noted.   Cardiovascular: Normal heart rate noted  Respiratory: Normal respiratory effort, no problems with respiration noted  Abdomen: Soft, gravid, appropriate for gestational age. Pain/Pressure: Absent     Pelvic: Vag. Bleeding: None     Cervical exam deferred        Extremities: Normal range of motion.     Mental Status: Normal mood and affect. Normal behavior. Normal judgment and thought content.   Urinalysis:         Assessment and Plan:  Pregnancy: [id number] at [redacted]w[redacted]d  1. Supervision of high risk pregnancy, antepartum BP and FHR normal 28 wk labs and TDAP given today  2. Other pre-existing diabetes mellitus during pregnancy in third trimester Not on any meds currently, see log above Not terribly out of goal I had been contacted by MFM earlier this week to discuss starting insulin as patient is not taking metformin due to concern that it is possibly causing panic attacks Long conversation with  patient that options are either insulin or metformin. Given she is not very far off goal I feel like metformin would be best, easiest, most comfortable choice In addition while I don't doubt the temporal relationship of metformin with these panic attacks, I think they are not the cause and we should address her anxiety more directly After discussion she is amenable to start metformin 500 mg BID Understands dose increases and even initiation of insulin may be necessary depending on glucose control going forward Following w MFM, EFW 97% and AFI 17 on scan from 10/31/2021  3. History of C-section X5 Long discussion about risks of future pregnancies Curious about timing of delivery Discussed that if DM is not well controlled will be between 37-39 weeks, otherwise likely at 39 weeks  4. Unwanted fertility Patient is firm in stating she does not want to have more children She is very worried about her health in relation to her higher order c section count She was under the impression that BTL would cause more pain down the line Discussed that this is possible but unlikely, and given she is already having a c section this is really the best option if she does not desire more pregnancies In addition can do bilateral salpingectomy and get added benefit of reduced ovarian cancer risk After discussion is in agreement with BTL, consent signed  5. Anxiety Accepts referral to [facility]  Preterm labor symptoms and general obstetric precautions including but not limited to vaginal bleeding, contractions, leaking of fluid and fetal movement  were reviewed in detail with the patient. Please refer to After Visit Summary for other counseling recommendations.  Return in 2 weeks (on 11/19/2021) for [facility], ob visit.   Venora Maples, MD

## 2021-11-05 NOTE — Patient Instructions (Signed)

## 2021-11-06 ENCOUNTER — Other Ambulatory Visit: Payer: Medicaid Other

## 2021-11-06 DIAGNOSIS — O99013 Anemia complicating pregnancy, third trimester: Secondary | ICD-10-CM | POA: Insufficient documentation

## 2021-11-06 LAB — CBC
Hematocrit: 31 % — ABNORMAL LOW (ref 34.0–46.6)
Hemoglobin: 9.8 g/dL — ABNORMAL LOW (ref 11.1–15.9)
MCH: 23.7 pg — ABNORMAL LOW (ref 26.6–33.0)
MCHC: 31.6 g/dL (ref 31.5–35.7)
MCV: 75 fL — ABNORMAL LOW (ref 79–97)
Platelets: 273 10*3/uL (ref 150–450)
RBC: 4.13 x10E6/uL (ref 3.77–5.28)
RDW: 16.4 % — ABNORMAL HIGH (ref 11.7–15.4)
WBC: 9.2 10*3/uL (ref 3.4–10.8)

## 2021-11-06 LAB — HIV ANTIBODY (ROUTINE TESTING W REFLEX): HIV Screen 4th Generation wRfx: NONREACTIVE

## 2021-11-06 LAB — RPR: RPR Ser Ql: NONREACTIVE

## 2021-11-06 MED ORDER — FERROUS SULFATE 325 (65 FE) MG PO TBEC: 325.0000 mg | DELAYED_RELEASE_TABLET | ORAL | 2 refills | Status: DC

## 2021-11-06 NOTE — Addendum Note (Signed)
Addended by: Merian Capron on: 11/06/2021 01:48 PM   Modules accepted: Orders

## 2021-11-07 NOTE — BH Specialist Note (Unsigned)
Integrated Behavioral Health via Telemedicine Visit  11/07/2021 Colleen Wells [id number]  Number of Integrated Behavioral Health Clinician visits: No data recorded Session Start time: No data recorded  Session End time: No data recorded Total time in minutes: No data recorded  Referring Provider: Merian Capron, MD Patient/Family location: [facility] Provider location: [facility]  All persons participating in visit: Patient Colleen Wells and Colleen Wells   Types of Service: Individual psychotherapy and Telephone visit  I connected with [name] and/or Angee Garman's  n/a  via  Telephone or Video Enabled Telemedicine Application  (Video is Caregility application) and verified that I am speaking with the correct person using two identifiers. Discussed confidentiality: Yes   I discussed the limitations of telemedicine and the availability of in person appointments.  Discussed there is a possibility of technology failure and discussed alternative modes of communication if that failure occurs.  I discussed that engaging in this telemedicine visit, they consent to the provision of behavioral healthcare and the services will be billed under their insurance.  Patient and/or legal guardian expressed understanding and consented to Telemedicine visit: Yes   Presenting Concerns: Patient and/or family reports the following symptoms/concerns: Waking up in panic with  heart palpitations; pt denies feeling anxiety. Duration of problem: Current pregnancy; Severity of problem: mild  Patient and/or Family's Strengths/Protective Factors: Social connections, Concrete supports in place (healthy food, safe environments, etc.), and Sense of purpose  Goals Addressed: Patient will:  Maintain reduction of symptoms of: anxiety   Increase knowledge and/or ability of: healthy habits   Progress towards  Goals: Achieved  Interventions: Interventions utilized:  Psychoeducation and/or Health Education and Supportive Reflection Standardized Assessments completed: Not Needed  Patient and/or Family Response: Patient agrees with treatment plan.   Assessment: Patient currently experiencing Other specified counseling.   Patient may benefit from psychoeducation and brief therapeutic interventions regarding maintaining reduction of symptoms of anxiety .  Plan: Follow up with behavioral health clinician on : Call Kelty Szafran at [phone], as needed. Behavioral recommendations:  -Continue taking prenatal vitamin and other medications as prescribed -Continue prioritizing healthy self-care (regular meals, adequate rest) Referral(s): Integrated [employer] (In Clinic)  I discussed the assessment and treatment plan with the patient and/or parent/guardian. They were provided an opportunity to ask questions and all were answered. They agreed with the plan and demonstrated an understanding of the instructions.   They were advised to call back or seek an in-person evaluation if the symptoms worsen or if the condition fails to improve as anticipated.  Valetta Close Daryl Beehler, LCSW     11/05/2021    2:08 PM 09/08/2021    4:59 PM 04/10/2019    2:07 PM 03/13/2019    4:38 PM 03/08/2019    2:31 PM  Depression screen PHQ 2/9  Decreased Interest 0 0 0 0 0  Down, Depressed, Hopeless 0 0 0 1 0  PHQ - 2 Score 0 0 0 1 0  Altered sleeping 0 0 0 0 0  Tired, decreased energy 0 0 1 0 1  Change in appetite 0 0 0 0 0  Feeling bad or failure about yourself  0 0 0 0 0  Trouble concentrating 0 0 0 0 0  Moving slowly or fidgety/restless 0 0 0 0 0  Suicidal thoughts 0 0 0 0 0  PHQ-9 Score 0 0 1 1 1   Difficult doing work/chores Not difficult at all  11/05/2021    2:08 PM 09/08/2021    5:00 PM 04/10/2019    2:08 PM 03/13/2019    4:38 PM  GAD 7 : Generalized Anxiety Score  Nervous, Anxious, on Edge 0  0 0 0  Control/stop worrying 0 0 0 2  Worry too much - different things 0 0 0 2  Trouble relaxing 0 0 0 0  Restless 0 0 0 0  Easily annoyed or irritable 0 0 0 0  Afraid - awful might happen 0 0 0 2  Total GAD 7 Score 0 0 0 6  Anxiety Difficulty   Not difficult at all

## 2021-11-10 ENCOUNTER — Ambulatory Visit (INDEPENDENT_AMBULATORY_CARE_PROVIDER_SITE_OTHER): Payer: Medicaid Other | Admitting: Clinical

## 2021-11-10 DIAGNOSIS — Z7189 Other specified counseling: Secondary | ICD-10-CM

## 2021-11-10 NOTE — Patient Instructions (Signed)
[facility] at [facility] [address] [place], [place] [address] [phone] (main office) [phone] (Carleen Rhue's office)   

## 2021-11-19 ENCOUNTER — Encounter: Payer: Self-pay | Admitting: Family Medicine

## 2021-11-19 ENCOUNTER — Ambulatory Visit (INDEPENDENT_AMBULATORY_CARE_PROVIDER_SITE_OTHER): Payer: Medicaid Other | Admitting: Family Medicine

## 2021-11-19 VITALS — BP 138/77 | HR 108 | Wt 211.4 lb

## 2021-11-19 DIAGNOSIS — Z3009 Encounter for other general counseling and advice on contraception: Secondary | ICD-10-CM

## 2021-11-19 DIAGNOSIS — O0990 Supervision of high risk pregnancy, unspecified, unspecified trimester: Secondary | ICD-10-CM

## 2021-11-19 DIAGNOSIS — O24813 Other pre-existing diabetes mellitus in pregnancy, third trimester: Secondary | ICD-10-CM

## 2021-11-19 DIAGNOSIS — F419 Anxiety disorder, unspecified: Secondary | ICD-10-CM

## 2021-11-19 DIAGNOSIS — O99013 Anemia complicating pregnancy, third trimester: Secondary | ICD-10-CM

## 2021-11-19 DIAGNOSIS — Z98891 History of uterine scar from previous surgery: Secondary | ICD-10-CM

## 2021-11-19 DIAGNOSIS — O0993 Supervision of high risk pregnancy, unspecified, third trimester: Secondary | ICD-10-CM

## 2021-11-19 DIAGNOSIS — Z3A30 30 weeks gestation of pregnancy: Secondary | ICD-10-CM

## 2021-11-19 NOTE — Patient Instructions (Signed)

## 2021-11-19 NOTE — Progress Notes (Signed)
   Subjective:  Colleen Wells is a 36 y.o. [id number] at [redacted]w[redacted]d being seen today for ongoing prenatal care.  She is currently monitored for the following issues for this high-risk pregnancy and has History of C-section; Genetic carrier; Supervision of high risk pregnancy, antepartum; Diabetes mellitus in pregnancy; Unwanted fertility; Anxiety; and Anemia affecting pregnancy in third trimester on their problem list.  Patient reports no complaints.  Contractions: Irritability. Vag. Bleeding: None.  Movement: Present. Denies leaking of fluid.   The following portions of the patient's history were reviewed and updated as appropriate: allergies, current medications, past family history, past medical history, past social history, past surgical history and problem list. Problem list updated.  Objective:   Vitals:   11/19/21 1519  BP: 138/77  Pulse: (!) 108  Weight: 211 lb 6.4 oz (95.9 kg)    Fetal Status: Fetal Heart Rate (bpm): 154   Movement: Present     General:  Alert, oriented and cooperative. Patient is in no acute distress.  Skin: Skin is warm and dry. No rash noted.   Cardiovascular: Normal heart rate noted  Respiratory: Normal respiratory effort, no problems with respiration noted  Abdomen: Soft, gravid, appropriate for gestational age. Pain/Pressure: Present     Pelvic: Vag. Bleeding: None     Cervical exam deferred        Extremities: Normal range of motion.     Mental Status: Normal mood and affect. Normal behavior. Normal judgment and thought content.   Urinalysis:      Assessment and Plan:  Pregnancy: [id number] at [redacted]w[redacted]d  1. Supervision of high risk pregnancy, antepartum BP and FHR normal  2. Other pre-existing diabetes mellitus during pregnancy in third trimester Sugars actually very well controlled with metformin 500 BID! Reports she actually takes vitamin gummy before fasting values, advised to take before and if normal no need to adjust Following w MFM, last [place] 10/31/2021  EFW 97%, AFI 17  3. History of C-section Will send message to schedule RCS+BTL 39 wks is around Thanksgiving, understands may be pushed forward a few days because of this  4. Unwanted fertility BTL consent signed on 11/05/2021  5. Anemia affecting pregnancy in third trimester Lab Results  Component Value Date   HGB 9.8 (L) 11/05/2021  Mild, on PO iron  6. Anxiety stable  Preterm labor symptoms and general obstetric precautions including but not limited to vaginal bleeding, contractions, leaking of fluid and fetal movement were reviewed in detail with the patient. Please refer to After Visit Summary for other counseling recommendations.  Return in 2 weeks (on 12/03/2021) for [facility], ob visit.   Clarnce Flock, MD

## 2021-11-20 DIAGNOSIS — O24812 Other pre-existing diabetes mellitus in pregnancy, second trimester: Secondary | ICD-10-CM | POA: Diagnosis not present

## 2021-11-20 DIAGNOSIS — O0973 Supervision of high risk pregnancy due to social problems, third trimester: Secondary | ICD-10-CM | POA: Diagnosis not present

## 2021-11-20 DIAGNOSIS — O24419 Gestational diabetes mellitus in pregnancy, unspecified control: Secondary | ICD-10-CM | POA: Diagnosis not present

## 2021-11-20 DIAGNOSIS — O0990 Supervision of high risk pregnancy, unspecified, unspecified trimester: Secondary | ICD-10-CM | POA: Diagnosis not present

## 2021-11-20 DIAGNOSIS — Z3A30 30 weeks gestation of pregnancy: Secondary | ICD-10-CM | POA: Diagnosis not present

## 2021-11-20 DIAGNOSIS — Z1152 Encounter for screening for COVID-19: Secondary | ICD-10-CM | POA: Diagnosis not present

## 2021-11-20 DIAGNOSIS — O0972 Supervision of high risk pregnancy due to social problems, second trimester: Secondary | ICD-10-CM | POA: Diagnosis not present

## 2021-11-28 ENCOUNTER — Other Ambulatory Visit: Payer: Self-pay | Admitting: *Deleted

## 2021-11-28 ENCOUNTER — Ambulatory Visit: Payer: Medicaid Other | Admitting: *Deleted

## 2021-11-28 ENCOUNTER — Ambulatory Visit: Payer: Medicaid Other | Attending: Maternal & Fetal Medicine

## 2021-11-28 ENCOUNTER — Other Ambulatory Visit: Payer: Self-pay | Admitting: Maternal & Fetal Medicine

## 2021-11-28 VITALS — BP 119/66 | HR 84

## 2021-11-28 DIAGNOSIS — O99213 Obesity complicating pregnancy, third trimester: Secondary | ICD-10-CM | POA: Diagnosis not present

## 2021-11-28 DIAGNOSIS — O0990 Supervision of high risk pregnancy, unspecified, unspecified trimester: Secondary | ICD-10-CM

## 2021-11-28 DIAGNOSIS — O34219 Maternal care for unspecified type scar from previous cesarean delivery: Secondary | ICD-10-CM | POA: Diagnosis not present

## 2021-11-28 DIAGNOSIS — Z3A32 32 weeks gestation of pregnancy: Secondary | ICD-10-CM

## 2021-11-28 DIAGNOSIS — O09523 Supervision of elderly multigravida, third trimester: Secondary | ICD-10-CM

## 2021-11-28 DIAGNOSIS — O24419 Gestational diabetes mellitus in pregnancy, unspecified control: Secondary | ICD-10-CM | POA: Insufficient documentation

## 2021-11-28 DIAGNOSIS — O285 Abnormal chromosomal and genetic finding on antenatal screening of mother: Secondary | ICD-10-CM | POA: Diagnosis not present

## 2021-11-28 DIAGNOSIS — Z148 Genetic carrier of other disease: Secondary | ICD-10-CM

## 2021-11-28 DIAGNOSIS — O24415 Gestational diabetes mellitus in pregnancy, controlled by oral hypoglycemic drugs: Secondary | ICD-10-CM

## 2021-11-28 DIAGNOSIS — O3660X0 Maternal care for excessive fetal growth, unspecified trimester, not applicable or unspecified: Secondary | ICD-10-CM | POA: Diagnosis not present

## 2021-11-28 DIAGNOSIS — E669 Obesity, unspecified: Secondary | ICD-10-CM

## 2021-11-28 DIAGNOSIS — Z1152 Encounter for screening for COVID-19: Secondary | ICD-10-CM | POA: Diagnosis not present

## 2021-11-28 DIAGNOSIS — O24410 Gestational diabetes mellitus in pregnancy, diet controlled: Secondary | ICD-10-CM

## 2021-11-28 NOTE — Procedures (Addendum)
Colleen Wells May 18, 1985 [redacted]w[redacted]d  Fetus A Non-Stress Test Interpretation for 11/28/21  Indication:  AMA, GDM-diet, obesity , Failed BPP  Fetal Heart Rate A Mode: External Baseline Rate (A): 150 bpm Variability: Moderate Accelerations: 15 x 15 Decelerations: None Multiple birth?: No  Uterine Activity Mode: Palpation Contraction Frequency (min): Occas w/UI Contraction Quality: Mild Resting Tone Palpated: Relaxed Resting Time: Adequate  Interpretation (Fetal Testing) Nonstress Test Interpretation: Reactive Comments: Dr. Gertie Exon reviewed tracing.

## 2021-12-04 ENCOUNTER — Encounter: Payer: Self-pay | Admitting: Obstetrics and Gynecology

## 2021-12-04 ENCOUNTER — Ambulatory Visit (INDEPENDENT_AMBULATORY_CARE_PROVIDER_SITE_OTHER): Payer: Medicaid Other | Admitting: Obstetrics and Gynecology

## 2021-12-04 VITALS — BP 122/71 | HR 88 | Wt 216.3 lb

## 2021-12-04 DIAGNOSIS — Z3009 Encounter for other general counseling and advice on contraception: Secondary | ICD-10-CM

## 2021-12-04 DIAGNOSIS — O24813 Other pre-existing diabetes mellitus in pregnancy, third trimester: Secondary | ICD-10-CM

## 2021-12-04 DIAGNOSIS — Z1152 Encounter for screening for COVID-19: Secondary | ICD-10-CM | POA: Diagnosis not present

## 2021-12-04 DIAGNOSIS — O0990 Supervision of high risk pregnancy, unspecified, unspecified trimester: Secondary | ICD-10-CM

## 2021-12-04 DIAGNOSIS — Z98891 History of uterine scar from previous surgery: Secondary | ICD-10-CM

## 2021-12-04 DIAGNOSIS — O99013 Anemia complicating pregnancy, third trimester: Secondary | ICD-10-CM

## 2021-12-04 NOTE — Patient Instructions (Signed)

## 2021-12-04 NOTE — Progress Notes (Signed)
Subjective:  Colleen Wells is a 36 y.o. F5D3220 at [redacted]w[redacted]d being seen today for ongoing prenatal care.  She is currently monitored for the following issues for this high-risk pregnancy and has History of C-section; Genetic carrier; Supervision of high risk pregnancy, antepartum; Diabetes mellitus in pregnancy; Unwanted fertility; Anxiety; and Anemia affecting pregnancy in third trimester on their problem list.  Patient reports general discomforts of pregnancy.  Contractions: Irritability. Vag. Bleeding: None.  Movement: Present. Denies leaking of fluid.   The following portions of the patient's history were reviewed and updated as appropriate: allergies, current medications, past family history, past medical history, past social history, past surgical history and problem list. Problem list updated.  Objective:   Vitals:   12/04/21 1601  BP: 122/71  Pulse: 88  Weight: 216 lb 4.8 oz (98.1 kg)    Fetal Status: Fetal Heart Rate (bpm): 147   Movement: Present     General:  Alert, oriented and cooperative. Patient is in no acute distress.  Skin: Skin is warm and dry. No rash noted.   Cardiovascular: Normal heart rate noted  Respiratory: Normal respiratory effort, no problems with respiration noted  Abdomen: Soft, gravid, appropriate for gestational age. Pain/Pressure: Present     Pelvic:  Cervical exam deferred        Extremities: Normal range of motion.  Edema: Trace  Mental Status: Normal mood and affect. Normal behavior. Normal judgment and thought content.   Urinalysis:      Assessment and Plan:  Pregnancy: [name] at [redacted]w[redacted]d  1. Supervision of high risk pregnancy, antepartum Stable  2. Other pre-existing diabetes mellitus during pregnancy in third trimester CBG's in goal range Continue with serial growth scans and antenatal testing as per  3. History of C-section For repeat  Scheduled  4. Anemia affecting pregnancy in third trimester Continue with iron supplement  5.  Unwanted fertility BTL papers signed  Preterm labor symptoms and general obstetric precautions including but not limited to vaginal bleeding, contractions, leaking of fluid and fetal movement were reviewed in detail with the patient. Please refer to After Visit Summary for other counseling recommendations.  Return in about 2 weeks (around 12/18/2021) for OB visit, face to face, MD only.   Chancy Milroy, MD

## 2021-12-05 ENCOUNTER — Ambulatory Visit: Payer: Medicaid Other | Admitting: *Deleted

## 2021-12-05 ENCOUNTER — Encounter: Payer: Self-pay | Admitting: *Deleted

## 2021-12-05 ENCOUNTER — Ambulatory Visit: Payer: Medicaid Other | Attending: Maternal & Fetal Medicine

## 2021-12-05 VITALS — BP 119/64 | HR 97

## 2021-12-05 DIAGNOSIS — O34219 Maternal care for unspecified type scar from previous cesarean delivery: Secondary | ICD-10-CM

## 2021-12-05 DIAGNOSIS — O99213 Obesity complicating pregnancy, third trimester: Secondary | ICD-10-CM

## 2021-12-05 DIAGNOSIS — O3663X0 Maternal care for excessive fetal growth, third trimester, not applicable or unspecified: Secondary | ICD-10-CM | POA: Diagnosis not present

## 2021-12-05 DIAGNOSIS — O285 Abnormal chromosomal and genetic finding on antenatal screening of mother: Secondary | ICD-10-CM | POA: Diagnosis not present

## 2021-12-05 DIAGNOSIS — O0990 Supervision of high risk pregnancy, unspecified, unspecified trimester: Secondary | ICD-10-CM | POA: Diagnosis not present

## 2021-12-05 DIAGNOSIS — O24415 Gestational diabetes mellitus in pregnancy, controlled by oral hypoglycemic drugs: Secondary | ICD-10-CM

## 2021-12-05 DIAGNOSIS — E669 Obesity, unspecified: Secondary | ICD-10-CM | POA: Diagnosis not present

## 2021-12-05 DIAGNOSIS — Z148 Genetic carrier of other disease: Secondary | ICD-10-CM | POA: Diagnosis not present

## 2021-12-05 DIAGNOSIS — O24419 Gestational diabetes mellitus in pregnancy, unspecified control: Secondary | ICD-10-CM | POA: Diagnosis not present

## 2021-12-05 DIAGNOSIS — O09523 Supervision of elderly multigravida, third trimester: Secondary | ICD-10-CM | POA: Diagnosis not present

## 2021-12-05 DIAGNOSIS — Z3A33 33 weeks gestation of pregnancy: Secondary | ICD-10-CM | POA: Diagnosis not present

## 2021-12-11 DIAGNOSIS — Z1152 Encounter for screening for COVID-19: Secondary | ICD-10-CM | POA: Diagnosis not present

## 2021-12-12 ENCOUNTER — Ambulatory Visit: Payer: Medicaid Other | Attending: Maternal & Fetal Medicine | Admitting: *Deleted

## 2021-12-12 ENCOUNTER — Ambulatory Visit: Payer: Medicaid Other | Admitting: *Deleted

## 2021-12-12 ENCOUNTER — Other Ambulatory Visit: Payer: Self-pay | Admitting: Maternal & Fetal Medicine

## 2021-12-12 ENCOUNTER — Ambulatory Visit (HOSPITAL_BASED_OUTPATIENT_CLINIC_OR_DEPARTMENT_OTHER): Payer: Medicaid Other

## 2021-12-12 VITALS — BP 133/74 | HR 106

## 2021-12-12 DIAGNOSIS — O283 Abnormal ultrasonic finding on antenatal screening of mother: Secondary | ICD-10-CM

## 2021-12-12 DIAGNOSIS — Z148 Genetic carrier of other disease: Secondary | ICD-10-CM

## 2021-12-12 DIAGNOSIS — O34219 Maternal care for unspecified type scar from previous cesarean delivery: Secondary | ICD-10-CM

## 2021-12-12 DIAGNOSIS — O285 Abnormal chromosomal and genetic finding on antenatal screening of mother: Secondary | ICD-10-CM

## 2021-12-12 DIAGNOSIS — O24419 Gestational diabetes mellitus in pregnancy, unspecified control: Secondary | ICD-10-CM | POA: Insufficient documentation

## 2021-12-12 DIAGNOSIS — O24415 Gestational diabetes mellitus in pregnancy, controlled by oral hypoglycemic drugs: Secondary | ICD-10-CM

## 2021-12-12 DIAGNOSIS — O0990 Supervision of high risk pregnancy, unspecified, unspecified trimester: Secondary | ICD-10-CM

## 2021-12-12 DIAGNOSIS — O99213 Obesity complicating pregnancy, third trimester: Secondary | ICD-10-CM

## 2021-12-12 DIAGNOSIS — O3663X0 Maternal care for excessive fetal growth, third trimester, not applicable or unspecified: Secondary | ICD-10-CM | POA: Diagnosis not present

## 2021-12-12 DIAGNOSIS — O09523 Supervision of elderly multigravida, third trimester: Secondary | ICD-10-CM

## 2021-12-12 DIAGNOSIS — Z3A34 34 weeks gestation of pregnancy: Secondary | ICD-10-CM | POA: Insufficient documentation

## 2021-12-12 DIAGNOSIS — E669 Obesity, unspecified: Secondary | ICD-10-CM | POA: Diagnosis not present

## 2021-12-12 NOTE — Procedures (Signed)
Colleen Wells 1985-12-08 [redacted]w[redacted]d  Fetus A Non-Stress Test Interpretation for 12/12/21  Indication: Unsatisfactory BPP  Fetal Heart Rate A Mode: External Baseline Rate (A): 140 bpm Variability: Moderate Accelerations: 15 x 15 Decelerations: Variable Multiple birth?: No  Uterine Activity Mode: Palpation, Toco Contraction Frequency (min): 1 uc Contraction Duration (sec): 80 Contraction Quality: Mild Resting Tone Palpated: Relaxed Resting Time: Adequate  Interpretation (Fetal Testing) Nonstress Test Interpretation: Reactive Overall Impression: Reassuring for gestational age Comments: Dr. Annamaria Boots reviewed tracing

## 2021-12-17 ENCOUNTER — Encounter: Payer: Self-pay | Admitting: Family Medicine

## 2021-12-17 ENCOUNTER — Other Ambulatory Visit: Payer: Self-pay

## 2021-12-17 ENCOUNTER — Ambulatory Visit (INDEPENDENT_AMBULATORY_CARE_PROVIDER_SITE_OTHER): Payer: Medicaid Other | Admitting: Family Medicine

## 2021-12-17 VITALS — BP 122/73 | HR 93 | Wt 212.5 lb

## 2021-12-17 DIAGNOSIS — O0990 Supervision of high risk pregnancy, unspecified, unspecified trimester: Secondary | ICD-10-CM

## 2021-12-17 DIAGNOSIS — O99013 Anemia complicating pregnancy, third trimester: Secondary | ICD-10-CM

## 2021-12-17 DIAGNOSIS — F419 Anxiety disorder, unspecified: Secondary | ICD-10-CM

## 2021-12-17 DIAGNOSIS — Z98891 History of uterine scar from previous surgery: Secondary | ICD-10-CM

## 2021-12-17 DIAGNOSIS — Z3A34 34 weeks gestation of pregnancy: Secondary | ICD-10-CM

## 2021-12-17 DIAGNOSIS — Z3009 Encounter for other general counseling and advice on contraception: Secondary | ICD-10-CM

## 2021-12-17 DIAGNOSIS — O24813 Other pre-existing diabetes mellitus in pregnancy, third trimester: Secondary | ICD-10-CM

## 2021-12-17 NOTE — Progress Notes (Signed)
Stopped taking Metformin x 1 week ago due to having headaches and dizziness with heart racing.  Colletta Maryland, RNC

## 2021-12-17 NOTE — Patient Instructions (Signed)

## 2021-12-17 NOTE — Progress Notes (Signed)
   Subjective:  Colleen Wells is a 36 y.o. O0H2122 at [redacted]w[redacted]d being seen today for ongoing prenatal care.  She is currently monitored for the following issues for this high-risk pregnancy and has History of C-section; Genetic carrier; Supervision of high risk pregnancy, antepartum; Diabetes mellitus in pregnancy; Unwanted fertility; Anxiety; and Anemia affecting pregnancy in third trimester on their problem list.  Patient reports no complaints.  Contractions: Not present. Vag. Bleeding: None.  Movement: Present. Denies leaking of fluid.   The following portions of the patient's history were reviewed and updated as appropriate: allergies, current medications, past family history, past medical history, past social history, past surgical history and problem list. Problem list updated.  Objective:   Vitals:   12/17/21 1509  BP: 122/73  Pulse: 93  Weight: 212 lb 8 oz (96.4 kg)    Fetal Status: Fetal Heart Rate (bpm): 140   Movement: Present        General:  Alert, oriented and cooperative. Patient is in no acute distress.  Skin: Skin is warm and dry. No rash noted.   Cardiovascular: Normal heart rate noted  Respiratory: Normal respiratory effort, no problems with respiration noted  Abdomen: Soft, gravid, appropriate for gestational age. Pain/Pressure: Absent     Pelvic: Vag. Bleeding: None     Cervical exam deferred        Extremities: Normal range of motion.     Mental Status: Normal mood and affect. Normal behavior. Normal judgment and thought content.   Urinalysis:      Assessment and Plan:  Pregnancy: [id number] at [redacted]w[redacted]d  1. Supervision of high risk pregnancy, antepartum BP and FHR normal  2. Other pre-existing diabetes mellitus during pregnancy in third trimester Stopped taking metformin as she felt it was causing dizziness, headaches, palptiations Discussed that I do not think this was caused by metformin, more likely anxiety related Sugars both fasting and dinner post  prandials largely uncontrolled Recommended restarting metformin, after much discussion she will do so Following w MFM, LGA on last [place], BPP reassuring to date If sugars back under control will plan for 39wk delivery  3. History of C-section Scheduled RCS+BTL at 39 weeks  4. Unwanted fertility Signed BTL papers 11/07/2021  5. Anxiety   6. Anemia affecting pregnancy in third trimester Lab Results  Component Value Date   HGB 9.8 (L) 11/05/2021    Preterm labor symptoms and general obstetric precautions including but not limited to vaginal bleeding, contractions, leaking of fluid and fetal movement were reviewed in detail with the patient. Please refer to After Visit Summary for other counseling recommendations.  Return in 2 weeks (on 12/31/2021) for [facility], ob visit.   Clarnce Flock, MD

## 2021-12-18 DIAGNOSIS — Z1152 Encounter for screening for COVID-19: Secondary | ICD-10-CM | POA: Diagnosis not present

## 2021-12-19 ENCOUNTER — Ambulatory Visit (HOSPITAL_BASED_OUTPATIENT_CLINIC_OR_DEPARTMENT_OTHER): Payer: Medicaid Other

## 2021-12-19 ENCOUNTER — Ambulatory Visit: Payer: Medicaid Other | Attending: Maternal & Fetal Medicine | Admitting: *Deleted

## 2021-12-19 VITALS — BP 130/68 | HR 104

## 2021-12-19 DIAGNOSIS — O09523 Supervision of elderly multigravida, third trimester: Secondary | ICD-10-CM | POA: Diagnosis not present

## 2021-12-19 DIAGNOSIS — Z148 Genetic carrier of other disease: Secondary | ICD-10-CM

## 2021-12-19 DIAGNOSIS — O0990 Supervision of high risk pregnancy, unspecified, unspecified trimester: Secondary | ICD-10-CM

## 2021-12-19 DIAGNOSIS — O3663X0 Maternal care for excessive fetal growth, third trimester, not applicable or unspecified: Secondary | ICD-10-CM | POA: Diagnosis not present

## 2021-12-19 DIAGNOSIS — O34219 Maternal care for unspecified type scar from previous cesarean delivery: Secondary | ICD-10-CM | POA: Diagnosis not present

## 2021-12-19 DIAGNOSIS — E669 Obesity, unspecified: Secondary | ICD-10-CM

## 2021-12-19 DIAGNOSIS — Z3A35 35 weeks gestation of pregnancy: Secondary | ICD-10-CM | POA: Insufficient documentation

## 2021-12-19 DIAGNOSIS — O285 Abnormal chromosomal and genetic finding on antenatal screening of mother: Secondary | ICD-10-CM

## 2021-12-19 DIAGNOSIS — O4103X0 Oligohydramnios, third trimester, not applicable or unspecified: Secondary | ICD-10-CM | POA: Diagnosis not present

## 2021-12-19 DIAGNOSIS — O24415 Gestational diabetes mellitus in pregnancy, controlled by oral hypoglycemic drugs: Secondary | ICD-10-CM

## 2021-12-19 DIAGNOSIS — Z98891 History of uterine scar from previous surgery: Secondary | ICD-10-CM | POA: Diagnosis not present

## 2021-12-19 DIAGNOSIS — O24419 Gestational diabetes mellitus in pregnancy, unspecified control: Secondary | ICD-10-CM | POA: Insufficient documentation

## 2021-12-19 DIAGNOSIS — O99213 Obesity complicating pregnancy, third trimester: Secondary | ICD-10-CM

## 2021-12-19 DIAGNOSIS — O3660X0 Maternal care for excessive fetal growth, unspecified trimester, not applicable or unspecified: Secondary | ICD-10-CM

## 2021-12-26 ENCOUNTER — Ambulatory Visit: Payer: Medicaid Other | Admitting: *Deleted

## 2021-12-26 ENCOUNTER — Ambulatory Visit: Payer: Medicaid Other | Attending: Maternal & Fetal Medicine

## 2021-12-26 ENCOUNTER — Ambulatory Visit (HOSPITAL_BASED_OUTPATIENT_CLINIC_OR_DEPARTMENT_OTHER): Payer: Medicaid Other | Admitting: Obstetrics

## 2021-12-26 ENCOUNTER — Other Ambulatory Visit: Payer: Self-pay | Admitting: *Deleted

## 2021-12-26 VITALS — BP 138/68 | HR 115

## 2021-12-26 DIAGNOSIS — O34219 Maternal care for unspecified type scar from previous cesarean delivery: Secondary | ICD-10-CM | POA: Insufficient documentation

## 2021-12-26 DIAGNOSIS — Z3A36 36 weeks gestation of pregnancy: Secondary | ICD-10-CM | POA: Diagnosis not present

## 2021-12-26 DIAGNOSIS — O24419 Gestational diabetes mellitus in pregnancy, unspecified control: Secondary | ICD-10-CM | POA: Insufficient documentation

## 2021-12-26 DIAGNOSIS — O0990 Supervision of high risk pregnancy, unspecified, unspecified trimester: Secondary | ICD-10-CM

## 2021-12-26 DIAGNOSIS — O99213 Obesity complicating pregnancy, third trimester: Secondary | ICD-10-CM

## 2021-12-26 DIAGNOSIS — E669 Obesity, unspecified: Secondary | ICD-10-CM

## 2021-12-26 DIAGNOSIS — Z1152 Encounter for screening for COVID-19: Secondary | ICD-10-CM | POA: Diagnosis not present

## 2021-12-26 DIAGNOSIS — O3663X0 Maternal care for excessive fetal growth, third trimester, not applicable or unspecified: Secondary | ICD-10-CM | POA: Diagnosis not present

## 2021-12-26 DIAGNOSIS — O285 Abnormal chromosomal and genetic finding on antenatal screening of mother: Secondary | ICD-10-CM

## 2021-12-26 DIAGNOSIS — O09523 Supervision of elderly multigravida, third trimester: Secondary | ICD-10-CM

## 2021-12-26 DIAGNOSIS — O3663X1 Maternal care for excessive fetal growth, third trimester, fetus 1: Secondary | ICD-10-CM

## 2021-12-26 DIAGNOSIS — O24415 Gestational diabetes mellitus in pregnancy, controlled by oral hypoglycemic drugs: Secondary | ICD-10-CM | POA: Insufficient documentation

## 2021-12-26 DIAGNOSIS — Z7984 Long term (current) use of oral hypoglycemic drugs: Secondary | ICD-10-CM

## 2021-12-26 DIAGNOSIS — Z148 Genetic carrier of other disease: Secondary | ICD-10-CM | POA: Diagnosis not present

## 2021-12-26 NOTE — Progress Notes (Signed)
MFM Note  Colleen Wells was seen for a follow up growth scan due to gestational diabetes.  The patient reports that she stopped taking metformin because she did not like the way it made her feel.    She reports that her fasting fingerstick values have been in the high 90s to low 100s range and her 2-hour postprandial fingerstick values have been as high as 180.  The EFW obtained today of 8 pounds 1 ounces measures greater than the 99th percentile for her gestational age.    There was normal amniotic fluid noted.  A BPP performed today was 8 out of 8.    Due to the large for gestational age fetus and potentially uncontrolled gestational diabetes, delivery should be considered at between 69 to 75 weeks in order to avoid an adverse pregnancy outcome.  Her repeat cesarean delivery should be moved up from when it is currently scheduled on January 16, 2022 (at 39 weeks).    She will return in 1 week for another BPP.    The patient states that she is happy with this management plan.  She stated that all of her questions were answered.  A total of 20 minutes was spent counseling and coordinating the care for this patient.  Greater than 50% of the time was spent in direct face-to-face contact.

## 2021-12-31 ENCOUNTER — Other Ambulatory Visit: Payer: Self-pay

## 2021-12-31 ENCOUNTER — Ambulatory Visit (INDEPENDENT_AMBULATORY_CARE_PROVIDER_SITE_OTHER): Payer: Medicaid Other | Admitting: Family Medicine

## 2021-12-31 ENCOUNTER — Other Ambulatory Visit (HOSPITAL_COMMUNITY)
Admission: RE | Admit: 2021-12-31 | Discharge: 2021-12-31 | Disposition: A | Discharge status: Home / Self Care | Payer: Medicaid Other | Source: Ambulatory Visit | Attending: Family Medicine | Admitting: Family Medicine

## 2021-12-31 VITALS — BP 123/72 | HR 106 | Wt 217.7 lb

## 2021-12-31 DIAGNOSIS — O0990 Supervision of high risk pregnancy, unspecified, unspecified trimester: Secondary | ICD-10-CM | POA: Insufficient documentation

## 2021-12-31 DIAGNOSIS — Z3A36 36 weeks gestation of pregnancy: Secondary | ICD-10-CM

## 2021-12-31 DIAGNOSIS — Z3009 Encounter for other general counseling and advice on contraception: Secondary | ICD-10-CM

## 2021-12-31 DIAGNOSIS — O99013 Anemia complicating pregnancy, third trimester: Secondary | ICD-10-CM

## 2021-12-31 DIAGNOSIS — O24813 Other pre-existing diabetes mellitus in pregnancy, third trimester: Secondary | ICD-10-CM

## 2021-12-31 DIAGNOSIS — Z98891 History of uterine scar from previous surgery: Secondary | ICD-10-CM

## 2021-12-31 NOTE — Progress Notes (Signed)
   PRENATAL VISIT NOTE  Subjective:  Colleen Wells is a 36 y.o. L8L3734 at [redacted]w[redacted]d being seen today for ongoing prenatal care.  She is currently monitored for the following issues for this low-risk pregnancy and has History of C-section; Genetic carrier; Supervision of high risk pregnancy, antepartum; Diabetes mellitus in pregnancy; Unwanted fertility; Anxiety; Anemia affecting pregnancy in third trimester; and GBS (group B Streptococcus carrier), +RV culture, currently pregnant on their problem list.  Patient reports no complaints.  Contractions: Irritability. Vag. Bleeding: None.  Movement: Present. Denies leaking of fluid.   The following portions of the patient's history were reviewed and updated as appropriate: allergies, current medications, past family history, past medical history, past social history, past surgical history and problem list.   Objective:   Vitals:   12/31/21 1612  BP: 123/72  Pulse: (!) 106  Weight: 217 lb 11.2 oz (98.7 kg)    Fetal Status: Fetal Heart Rate (bpm): 146   Movement: Present     General:  Alert, oriented and cooperative. Patient is in no acute distress.  Skin: Skin is warm and dry. No rash noted.   Cardiovascular: Normal heart rate noted  Respiratory: Normal respiratory effort, no problems with respiration noted  Abdomen: Soft, gravid, appropriate for gestational age.  Pain/Pressure: Present     Pelvic: Cervical exam deferred        Extremities: Normal range of motion.  Edema: None  Mental Status: Normal mood and affect. Normal behavior. Normal judgment and thought content.   Assessment and Plan:  Pregnancy: [id number] at [redacted]w[redacted]d 1. Anemia affecting pregnancy in third trimester Last hgb Lab Results  Component Value Date   HGB 9.8 (L) 11/05/2021   Which is low and has not been rechecked> patient is on oral iron. CBC will be checked for CS and possibly would benefit from IV Iron  2. Other pre-existing diabetes mellitus during pregnancy in third  trimester [place] - 92-115 2hr pp 96-126 MFM recommended moving CS to 37-38 weeks on 11/3 [place] which was sent to ordering provider and I am reviewing today  Will message about change changing date of CS  3. Supervision of high risk pregnancy, antepartum Up to date GBS/CT/GC swabs today Discussed moving CS and patient in agreement.   4. History of C-section RCS with BTS scheduled on 11/24  5. Unwanted fertility Desires BTS and signed papers  Preterm labor symptoms and general obstetric precautions including but not limited to vaginal bleeding, contractions, leaking of fluid and fetal movement were reviewed in detail with the patient. Please refer to After Visit Summary for other counseling recommendations.   Return in about 1 week (around 01/07/2022) for Routine prenatal care.  Future Appointments  Date Time Provider Department Center  01/08/2022  8:30 AM [facility] NURSE [facility]  01/08/2022  8:45 AM [facility] NST [facility]  01/09/2022 10:15 AM Warden Fillers, MD [facility]  01/14/2022  3:55 PM Anyanwu, Jethro Bastos, MD [facility]  01/21/2022  3:55 PM Venora Maples, MD [facility]    Federico Flake, MD

## 2022-01-01 ENCOUNTER — Ambulatory Visit: Payer: Medicaid Other | Attending: Obstetrics

## 2022-01-01 ENCOUNTER — Ambulatory Visit: Payer: Medicaid Other | Admitting: *Deleted

## 2022-01-01 VITALS — BP 122/64 | HR 92

## 2022-01-01 DIAGNOSIS — O0990 Supervision of high risk pregnancy, unspecified, unspecified trimester: Secondary | ICD-10-CM | POA: Insufficient documentation

## 2022-01-01 DIAGNOSIS — O3663X0 Maternal care for excessive fetal growth, third trimester, not applicable or unspecified: Secondary | ICD-10-CM

## 2022-01-01 DIAGNOSIS — O09523 Supervision of elderly multigravida, third trimester: Secondary | ICD-10-CM | POA: Insufficient documentation

## 2022-01-01 DIAGNOSIS — O24415 Gestational diabetes mellitus in pregnancy, controlled by oral hypoglycemic drugs: Secondary | ICD-10-CM

## 2022-01-01 DIAGNOSIS — O24419 Gestational diabetes mellitus in pregnancy, unspecified control: Secondary | ICD-10-CM | POA: Insufficient documentation

## 2022-01-01 DIAGNOSIS — Z3A36 36 weeks gestation of pregnancy: Secondary | ICD-10-CM

## 2022-01-01 DIAGNOSIS — O3663X1 Maternal care for excessive fetal growth, third trimester, fetus 1: Secondary | ICD-10-CM | POA: Insufficient documentation

## 2022-01-01 DIAGNOSIS — O34219 Maternal care for unspecified type scar from previous cesarean delivery: Secondary | ICD-10-CM | POA: Diagnosis not present

## 2022-01-01 DIAGNOSIS — Z148 Genetic carrier of other disease: Secondary | ICD-10-CM | POA: Diagnosis not present

## 2022-01-01 LAB — GC/CHLAMYDIA PROBE AMP (~~LOC~~) NOT AT ARMC
Chlamydia: NEGATIVE
Comment: NEGATIVE
Comment: NORMAL
Neisseria Gonorrhea: NEGATIVE

## 2022-01-03 LAB — CULTURE, BETA STREP (GROUP B ONLY): Strep Gp B Culture: POSITIVE — AB

## 2022-01-05 ENCOUNTER — Other Ambulatory Visit: Payer: Self-pay | Admitting: Obstetrics and Gynecology

## 2022-01-05 ENCOUNTER — Encounter: Payer: Self-pay | Admitting: Family Medicine

## 2022-01-05 DIAGNOSIS — Z98891 History of uterine scar from previous surgery: Secondary | ICD-10-CM

## 2022-01-05 DIAGNOSIS — O99820 Streptococcus B carrier state complicating pregnancy: Secondary | ICD-10-CM

## 2022-01-05 HISTORY — DX: Streptococcus B carrier state complicating pregnancy: O99.820

## 2022-01-06 ENCOUNTER — Encounter (HOSPITAL_COMMUNITY): Payer: Self-pay

## 2022-01-06 NOTE — Patient Instructions (Signed)
Makalah Asberry  01/06/2022   Your procedure is scheduled on:  01/08/2022  Arrive at 0800 at Entrance C on [address] at [facility]  and [employer]. You are invited to use the FREE valet parking or use the Visitor's parking deck.  Pick up the phone at the desk and dial [phone].  Call this number if you have problems the morning of surgery: [phone]  Remember:   Do not eat food:(After Midnight) Desps de medianoche.  Do not drink clear liquids: (After Midnight) Desps de medianoche.  Take these medicines the morning of surgery with A SIP OF WATER:  none   Do not wear jewelry, make-up or nail polish.  Do not wear lotions, powders, or perfumes. Do not wear deodorant.  Do not shave 48 hours prior to surgery.  Do not bring valuables to the hospital.  [facility] is not   responsible for any belongings or valuables brought to the hospital.  Contacts, dentures or bridgework may not be worn into surgery.  Leave suitcase in the car. After surgery it may be brought to your room.  For patients admitted to the hospital, checkout time is 11:00 AM the day of              discharge.      Please read over the following fact sheets that you were given:     Preparing for Surgery

## 2022-01-06 NOTE — Anesthesia Preprocedure Evaluation (Signed)
Anesthesia Evaluation  Patient identified by MRN, date of birth, ID band Patient awake    Reviewed: Allergy & Precautions, NPO status , Patient's Chart, lab work & pertinent test results  History of Anesthesia Complications Negative for: history of anesthetic complications  Airway Mallampati: III  TM Distance: >3 FB Neck ROM: Full    Dental no notable dental hx.    Pulmonary neg pulmonary ROS   Pulmonary exam normal breath sounds clear to auscultation       Cardiovascular hypertension, (-) angina  Rhythm:Regular Rate:Normal     Neuro/Psych  PSYCHIATRIC DISORDERS Anxiety     negative neurological ROS     GI/Hepatic negative GI ROS, Neg liver ROS,,,  Endo/Other  diabetes (gestational)    Renal/GU negative Renal ROS     Musculoskeletal  (+) Arthritis ,    Abdominal   Peds  Hematology  (+) Blood dyscrasia, anemia   Anesthesia Other Findings 36 y.o. L2G4010 presents for RCS with BTS  Reproductive/Obstetrics                             Anesthesia Physical Anesthesia Plan  ASA: 2  Anesthesia Plan: Spinal   Post-op Pain Management:    Induction:   PONV Risk Score and Plan: 2 and Ondansetron  Airway Management Planned: Natural Airway  Additional Equipment:   Intra-op Plan:   Post-operative Plan:   Informed Consent: I have reviewed the patients History and Physical, chart, labs and discussed the procedure including the risks, benefits and alternatives for the proposed anesthesia with the patient or authorized representative who has indicated his/her understanding and acceptance.       Plan Discussed with: CRNA and Anesthesiologist  Anesthesia Plan Comments: (I have discussed risks of neuraxial anesthesia including but not limited to infection, bleeding, nerve injury, back pain, headache, seizures, and failure of block. Patient denies bleeding disorders and is not currently  anticoagulated. Labs have been reviewed. Risks and benefits discussed. All patient's questions answered.  )        Anesthesia Quick Evaluation

## 2022-01-07 ENCOUNTER — Encounter (HOSPITAL_COMMUNITY)
Admission: RE | Admit: 2022-01-07 | Discharge: 2022-01-07 | Disposition: A | Discharge status: Home / Self Care | Payer: Medicaid Other | Source: Ambulatory Visit | Attending: Family Medicine | Admitting: Family Medicine

## 2022-01-07 DIAGNOSIS — Z98891 History of uterine scar from previous surgery: Secondary | ICD-10-CM | POA: Diagnosis not present

## 2022-01-07 LAB — CBC
HCT: 35 % — ABNORMAL LOW (ref 36.0–46.0)
Hemoglobin: 11.1 g/dL — ABNORMAL LOW (ref 12.0–15.0)
MCH: 25.2 pg — ABNORMAL LOW (ref 26.0–34.0)
MCHC: 31.7 g/dL (ref 30.0–36.0)
MCV: 79.4 fL — ABNORMAL LOW (ref 80.0–100.0)
Platelets: 220 10*3/uL (ref 150–400)
RBC: 4.41 MIL/uL (ref 3.87–5.11)
RDW: 18.7 % — ABNORMAL HIGH (ref 11.5–15.5)
WBC: 9.6 10*3/uL (ref 4.0–10.5)
nRBC: 0 % (ref 0.0–0.2)

## 2022-01-07 LAB — TYPE AND SCREEN
ABO/RH(D): O POS
Antibody Screen: NEGATIVE

## 2022-01-07 LAB — RPR: RPR Ser Ql: NONREACTIVE

## 2022-01-07 NOTE — Patient Instructions (Signed)
Colleen Wells  01/07/2022   Your procedure is scheduled on:  01/08/2022  Arrive at 0830 at [employer] C on [address] at [facility]  and [employer]. You are invited to use the FREE valet parking or use the Visitor's parking deck.  Pick up the phone at the desk and dial [phone].  Call this number if you have problems the morning of surgery: [phone]  Remember:   Do not eat food:(After Midnight) Desps de medianoche.  Do not drink clear liquids: (After Midnight) Desps de medianoche.  Take these medicines the morning of surgery with A SIP OF WATER:  none   Do not wear jewelry, make-up or nail polish.  Do not wear lotions, powders, or perfumes. Do not wear deodorant.  Do not shave 48 hours prior to surgery.  Do not bring valuables to the hospital.  [facility] is not   responsible for any belongings or valuables brought to the hospital.  Contacts, dentures or bridgework may not be worn into surgery.  Leave suitcase in the car. After surgery it may be brought to your room.  For patients admitted to the hospital, checkout time is 11:00 AM the day of              discharge.      Please read over the following fact sheets that you were given:     Preparing for Surgery

## 2022-01-08 ENCOUNTER — Encounter: Payer: Self-pay | Admitting: Obstetrics and Gynecology

## 2022-01-08 ENCOUNTER — Inpatient Hospital Stay (HOSPITAL_COMMUNITY)
Admission: RE | Admit: 2022-01-08 | Discharge: 2022-01-10 | DRG: 788 | Disposition: A | Discharge status: Home / Self Care | Payer: Medicaid Other | Attending: Obstetrics and Gynecology | Admitting: Obstetrics and Gynecology

## 2022-01-08 ENCOUNTER — Other Ambulatory Visit: Payer: Self-pay

## 2022-01-08 ENCOUNTER — Encounter (HOSPITAL_COMMUNITY)
Admission: RE | Disposition: A | Discharge status: Home / Self Care | Payer: Self-pay | Source: Home / Self Care | Attending: Obstetrics and Gynecology

## 2022-01-08 ENCOUNTER — Inpatient Hospital Stay (HOSPITAL_COMMUNITY): Payer: Medicaid Other | Admitting: Anesthesiology

## 2022-01-08 ENCOUNTER — Ambulatory Visit: Payer: Medicaid Other

## 2022-01-08 ENCOUNTER — Encounter (HOSPITAL_COMMUNITY): Payer: Self-pay | Admitting: Obstetrics and Gynecology

## 2022-01-08 DIAGNOSIS — O24429 Gestational diabetes mellitus in childbirth, unspecified control: Secondary | ICD-10-CM

## 2022-01-08 DIAGNOSIS — O24424 Gestational diabetes mellitus in childbirth, insulin controlled: Secondary | ICD-10-CM | POA: Diagnosis not present

## 2022-01-08 DIAGNOSIS — O0990 Supervision of high risk pregnancy, unspecified, unspecified trimester: Secondary | ICD-10-CM

## 2022-01-08 DIAGNOSIS — O34211 Maternal care for low transverse scar from previous cesarean delivery: Secondary | ICD-10-CM

## 2022-01-08 DIAGNOSIS — Z302 Encounter for sterilization: Secondary | ICD-10-CM | POA: Diagnosis not present

## 2022-01-08 DIAGNOSIS — Z98891 History of uterine scar from previous surgery: Secondary | ICD-10-CM

## 2022-01-08 DIAGNOSIS — O3663X0 Maternal care for excessive fetal growth, third trimester, not applicable or unspecified: Secondary | ICD-10-CM | POA: Diagnosis present

## 2022-01-08 DIAGNOSIS — O99820 Streptococcus B carrier state complicating pregnancy: Secondary | ICD-10-CM | POA: Diagnosis not present

## 2022-01-08 DIAGNOSIS — O2412 Pre-existing diabetes mellitus, type 2, in childbirth: Secondary | ICD-10-CM | POA: Diagnosis not present

## 2022-01-08 DIAGNOSIS — F419 Anxiety disorder, unspecified: Secondary | ICD-10-CM | POA: Diagnosis present

## 2022-01-08 DIAGNOSIS — Z148 Genetic carrier of other disease: Secondary | ICD-10-CM | POA: Diagnosis not present

## 2022-01-08 DIAGNOSIS — Z1152 Encounter for screening for COVID-19: Secondary | ICD-10-CM | POA: Diagnosis not present

## 2022-01-08 DIAGNOSIS — O99013 Anemia complicating pregnancy, third trimester: Secondary | ICD-10-CM | POA: Diagnosis present

## 2022-01-08 DIAGNOSIS — Z3A37 37 weeks gestation of pregnancy: Secondary | ICD-10-CM | POA: Diagnosis not present

## 2022-01-08 DIAGNOSIS — O3660X0 Maternal care for excessive fetal growth, unspecified trimester, not applicable or unspecified: Secondary | ICD-10-CM | POA: Insufficient documentation

## 2022-01-08 DIAGNOSIS — O24919 Unspecified diabetes mellitus in pregnancy, unspecified trimester: Secondary | ICD-10-CM | POA: Diagnosis present

## 2022-01-08 DIAGNOSIS — O99824 Streptococcus B carrier state complicating childbirth: Secondary | ICD-10-CM | POA: Diagnosis present

## 2022-01-08 LAB — GLUCOSE, CAPILLARY
Glucose-Capillary: 99 mg/dL (ref 70–99)
Glucose-Capillary: 97 mg/dL (ref 70–99)

## 2022-01-08 SURGERY — Surgical Case
Anesthesia: Spinal

## 2022-01-08 MED ORDER — COCONUT OIL OIL: 1.0000 | TOPICAL_OIL | Status: DC | PRN

## 2022-01-08 MED ORDER — WITCH HAZEL-GLYCERIN EX PADS: 1.0000 | MEDICATED_PAD | CUTANEOUS | Status: DC | PRN

## 2022-01-08 MED ORDER — SIMETHICONE 80 MG PO CHEW
80.0000 mg | CHEWABLE_TABLET | Freq: Three times a day (TID) | ORAL | Status: DC
  Administered 2022-01-08 – 2022-01-10 (×6): 80 mg via ORAL
  Filled 2022-01-08 (×7): qty 1

## 2022-01-08 MED ORDER — ONDANSETRON HCL 4 MG/2ML IJ SOLN
INTRAMUSCULAR | Status: DC | PRN
  Administered 2022-01-08: 4 mg via INTRAVENOUS

## 2022-01-08 MED ORDER — PHENYLEPHRINE HCL-NACL 20-0.9 MG/250ML-% IV SOLN
INTRAVENOUS | Status: AC
  Filled 2022-01-08: qty 250

## 2022-01-08 MED ORDER — TRANEXAMIC ACID-NACL 1000-0.7 MG/100ML-% IV SOLN
1000.0000 mg | INTRAVENOUS | Status: AC
  Administered 2022-01-08: 1000 mg via INTRAVENOUS

## 2022-01-08 MED ORDER — MORPHINE SULFATE (PF) 0.5 MG/ML IJ SOLN
INTRAMUSCULAR | Status: AC
  Filled 2022-01-08: qty 10

## 2022-01-08 MED ORDER — OXYTOCIN-SODIUM CHLORIDE 30-0.9 UT/500ML-% IV SOLN
INTRAVENOUS | Status: DC | PRN
  Administered 2022-01-08: 300 mL via INTRAVENOUS

## 2022-01-08 MED ORDER — SENNOSIDES-DOCUSATE SODIUM 8.6-50 MG PO TABS
2.0000 | ORAL_TABLET | ORAL | Status: DC
  Administered 2022-01-08 – 2022-01-10 (×3): 2 via ORAL
  Filled 2022-01-08 (×3): qty 2

## 2022-01-08 MED ORDER — PHENYLEPHRINE HCL-NACL 20-0.9 MG/250ML-% IV SOLN
INTRAVENOUS | Status: DC | PRN
  Administered 2022-01-08: 60 ug/min via INTRAVENOUS

## 2022-01-08 MED ORDER — IBUPROFEN 600 MG PO TABS
600.0000 mg | ORAL_TABLET | Freq: Four times a day (QID) | ORAL | Status: DC
  Administered 2022-01-09 – 2022-01-10 (×4): 600 mg via ORAL
  Filled 2022-01-08 (×5): qty 1

## 2022-01-08 MED ORDER — FENTANYL CITRATE (PF) 100 MCG/2ML IJ SOLN
INTRAMUSCULAR | Status: DC | PRN
  Administered 2022-01-08: 15 ug via INTRATHECAL

## 2022-01-08 MED ORDER — BUPIVACAINE IN DEXTROSE 0.75-8.25 % IT SOLN
INTRATHECAL | Status: DC | PRN
  Administered 2022-01-08: 1.6 mL via INTRATHECAL

## 2022-01-08 MED ORDER — MORPHINE SULFATE (PF) 0.5 MG/ML IJ SOLN
INTRAMUSCULAR | Status: DC | PRN
  Administered 2022-01-08: 150 ug via INTRATHECAL

## 2022-01-08 MED ORDER — ZOLPIDEM TARTRATE 5 MG PO TABS: 5.0000 mg | ORAL_TABLET | Freq: Every evening | ORAL | Status: DC | PRN

## 2022-01-08 MED ORDER — CEFAZOLIN SODIUM-DEXTROSE 2-4 GM/100ML-% IV SOLN
2.0000 g | INTRAVENOUS | Status: AC
  Administered 2022-01-08: 2 g via INTRAVENOUS

## 2022-01-08 MED ORDER — ACETAMINOPHEN 10 MG/ML IV SOLN
INTRAVENOUS | Status: AC
  Filled 2022-01-08: qty 100

## 2022-01-08 MED ORDER — DIPHENHYDRAMINE HCL 25 MG PO CAPS: 25.0000 mg | ORAL_CAPSULE | Freq: Four times a day (QID) | ORAL | Status: DC | PRN

## 2022-01-08 MED ORDER — ACETAMINOPHEN 10 MG/ML IV SOLN
INTRAVENOUS | Status: DC | PRN
  Administered 2022-01-08: 1000 mg via INTRAVENOUS

## 2022-01-08 MED ORDER — OXYCODONE HCL 5 MG PO TABS
5.0000 mg | ORAL_TABLET | ORAL | Status: DC | PRN
  Administered 2022-01-09: 5 mg via ORAL
  Filled 2022-01-08: qty 1

## 2022-01-08 MED ORDER — ACETAMINOPHEN 500 MG PO TABS: 1000.0000 mg | ORAL_TABLET | Freq: Four times a day (QID) | ORAL | Status: DC

## 2022-01-08 MED ORDER — SIMETHICONE 80 MG PO CHEW
80.0000 mg | CHEWABLE_TABLET | ORAL | Status: DC | PRN
  Administered 2022-01-09: 80 mg via ORAL

## 2022-01-08 MED ORDER — TRANEXAMIC ACID-NACL 1000-0.7 MG/100ML-% IV SOLN
INTRAVENOUS | Status: AC
  Filled 2022-01-08: qty 100

## 2022-01-08 MED ORDER — KETOROLAC TROMETHAMINE 30 MG/ML IJ SOLN
30.0000 mg | Freq: Four times a day (QID) | INTRAMUSCULAR | Status: AC
  Administered 2022-01-08 – 2022-01-09 (×3): 30 mg via INTRAVENOUS
  Filled 2022-01-08 (×3): qty 1

## 2022-01-08 MED ORDER — IBUPROFEN 600 MG PO TABS: 600.0000 mg | ORAL_TABLET | Freq: Four times a day (QID) | ORAL | Status: DC

## 2022-01-08 MED ORDER — POVIDONE-IODINE 10 % EX SWAB
2.0000 | Freq: Once | CUTANEOUS | Status: AC
  Administered 2022-01-08: 2 via TOPICAL

## 2022-01-08 MED ORDER — PRENATAL MULTIVITAMIN CH
1.0000 | ORAL_TABLET | Freq: Every day | ORAL | Status: DC
  Filled 2022-01-08 (×2): qty 1

## 2022-01-08 MED ORDER — ENOXAPARIN SODIUM 60 MG/0.6ML IJ SOSY
50.0000 mg | PREFILLED_SYRINGE | INTRAMUSCULAR | Status: DC
  Administered 2022-01-09 – 2022-01-10 (×2): 50 mg via SUBCUTANEOUS
  Filled 2022-01-08 (×2): qty 0.6

## 2022-01-08 MED ORDER — MEASLES, MUMPS & RUBELLA VAC IJ SOLR: 0.5000 mL | Freq: Once | INTRAMUSCULAR | Status: DC

## 2022-01-08 MED ORDER — LACTATED RINGERS IV SOLN: INTRAVENOUS | Status: DC

## 2022-01-08 MED ORDER — ONDANSETRON HCL 4 MG/2ML IJ SOLN
INTRAMUSCULAR | Status: AC
  Filled 2022-01-08: qty 2

## 2022-01-08 MED ORDER — OXYTOCIN-SODIUM CHLORIDE 30-0.9 UT/500ML-% IV SOLN
2.5000 [IU]/h | INTRAVENOUS | Status: AC
  Administered 2022-01-08: 2.5 [IU]/h via INTRAVENOUS

## 2022-01-08 MED ORDER — LIDOCAINE-EPINEPHRINE (PF) 2 %-1:200000 IJ SOLN
INTRAMUSCULAR | Status: AC
  Filled 2022-01-08: qty 20

## 2022-01-08 MED ORDER — CEFAZOLIN SODIUM-DEXTROSE 2-4 GM/100ML-% IV SOLN
INTRAVENOUS | Status: AC
  Filled 2022-01-08: qty 100

## 2022-01-08 MED ORDER — FENTANYL CITRATE (PF) 100 MCG/2ML IJ SOLN
INTRAMUSCULAR | Status: AC
  Filled 2022-01-08: qty 2

## 2022-01-08 MED ORDER — ACETAMINOPHEN 500 MG PO TABS
1000.0000 mg | ORAL_TABLET | Freq: Four times a day (QID) | ORAL | Status: DC
  Administered 2022-01-08 – 2022-01-10 (×7): 1000 mg via ORAL
  Filled 2022-01-08 (×7): qty 2

## 2022-01-08 MED ORDER — SODIUM BICARBONATE 8.4 % IV SOLN
INTRAVENOUS | Status: AC
  Filled 2022-01-08: qty 50

## 2022-01-08 MED ORDER — MENTHOL 3 MG MT LOZG: 1.0000 | LOZENGE | OROMUCOSAL | Status: DC | PRN

## 2022-01-08 MED ORDER — OXYTOCIN-SODIUM CHLORIDE 30-0.9 UT/500ML-% IV SOLN
INTRAVENOUS | Status: AC
  Filled 2022-01-08: qty 1000

## 2022-01-08 MED ORDER — DIBUCAINE (PERIANAL) 1 % EX OINT: 1.0000 | TOPICAL_OINTMENT | CUTANEOUS | Status: DC | PRN

## 2022-01-08 MED ORDER — OXYTOCIN-SODIUM CHLORIDE 30-0.9 UT/500ML-% IV SOLN
INTRAVENOUS | Status: AC
  Filled 2022-01-08: qty 500

## 2022-01-08 SURGICAL SUPPLY — 33 items
BENZOIN TINCTURE PRP APPL 2/3 (GAUZE/BANDAGES/DRESSINGS) ×1 IMPLANT
CHLORAPREP W/TINT 26ML (MISCELLANEOUS) ×2 IMPLANT
CLAMP UMBILICAL CORD (MISCELLANEOUS) ×1 IMPLANT
CLOTH BEACON ORANGE TIMEOUT ST (SAFETY) ×1 IMPLANT
DRSG OPSITE POSTOP 4X10 (GAUZE/BANDAGES/DRESSINGS) ×1 IMPLANT
ELECT REM PT RETURN 9FT ADLT (ELECTROSURGICAL) ×1
ELECTRODE REM PT RTRN 9FT ADLT (ELECTROSURGICAL) ×1 IMPLANT
EXTRACTOR VACUUM M CUP 4 TUBE (SUCTIONS) IMPLANT
GAUZE SPONGE 4X4 12PLY STRL LF (GAUZE/BANDAGES/DRESSINGS) IMPLANT
GLOVE BIOGEL PI IND STRL 7.0 (GLOVE) ×2 IMPLANT
GLOVE BIOGEL PI IND STRL 7.5 (GLOVE) ×2 IMPLANT
GLOVE ECLIPSE 7.5 STRL STRAW (GLOVE) ×1 IMPLANT
GOWN STRL REUS W/TWL LRG LVL3 (GOWN DISPOSABLE) ×3 IMPLANT
KIT ABG SYR 3ML LUER SLIP (SYRINGE) IMPLANT
MAT PREVALON FULL STRYKER (MISCELLANEOUS) IMPLANT
NDL HYPO 25X5/8 SAFETYGLIDE (NEEDLE) IMPLANT
NEEDLE HYPO 25X5/8 SAFETYGLIDE (NEEDLE) IMPLANT
NS IRRIG 1000ML POUR BTL (IV SOLUTION) ×1 IMPLANT
PACK C SECTION WH (CUSTOM PROCEDURE TRAY) ×1 IMPLANT
PAD ABD 7.5X8 STRL (GAUZE/BANDAGES/DRESSINGS) IMPLANT
PAD OB MATERNITY 4.3X12.25 (PERSONAL CARE ITEMS) ×1 IMPLANT
RTRCTR C-SECT PINK 25CM LRG (MISCELLANEOUS) ×1 IMPLANT
STRIP CLOSURE SKIN 1/2X4 (GAUZE/BANDAGES/DRESSINGS) ×1 IMPLANT
SUT PLAIN 0 NONE (SUTURE) ×1 IMPLANT
SUT VIC AB 0 CT1 36 (SUTURE) ×3 IMPLANT
SUT VIC AB 0 CTX 36 (SUTURE) ×1
SUT VIC AB 0 CTX36XBRD ANBCTRL (SUTURE) ×1 IMPLANT
SUT VIC AB 2-0 CT1 27 (SUTURE) ×1
SUT VIC AB 2-0 CT1 TAPERPNT 27 (SUTURE) ×1 IMPLANT
SUT VIC AB 4-0 KS 27 (SUTURE) ×1 IMPLANT
TOWEL OR 17X24 6PK STRL BLUE (TOWEL DISPOSABLE) ×1 IMPLANT
TRAY FOLEY W/BAG SLVR 14FR LF (SET/KITS/TRAYS/PACK) ×1 IMPLANT
WATER STERILE IRR 1000ML POUR (IV SOLUTION) ×1 IMPLANT

## 2022-01-08 NOTE — Discharge Summary (Signed)
Postpartum Discharge Summary     Patient Name: Colleen Wells DOB: May 30, 1985 MRN: [id number]  Date of admission: 01/08/2022 Delivery date:01/08/2022  Delivering provider: Laurey Arrow BEDFORD  Date of discharge: 01/10/2022  Admitting diagnosis: Status post repeat low transverse cesarean section [Z98.891] Intrauterine pregnancy: [redacted]w[redacted]d    Secondary diagnosis:  Principal Problem:   Status post repeat low transverse cesarean section Active Problems:   Supervision of high risk pregnancy, antepartum   Diabetes mellitus in pregnancy   Anxiety   Anemia affecting pregnancy in third trimester   LGA (large for gestational age) fetus affecting management of mother  Additional problems: n/a    Discharge diagnosis: Term Pregnancy Delivered, Type 2 DM, and Anemia                                              Post partum procedures: n/a Augmentation: N/A Complications: None  Hospital course: Sceduled C/S   36y.o. yo [id number] admitted to the hospital 01/08/2022 for scheduled cesarean section with the following indication:Elective Repeat.Delivery details are as follows:  Membrane Rupture Time/Date: 11:01 AM ,01/08/2022   Delivery Method:C-Section, Low Transverse  Details of operation can be found in separate operative note.  Patient had a uncomplicated postpartum course.  She is ambulating, tolerating a regular diet, passing flatus, and urinating well. Patient is discharged home in stable condition on  01/10/22        Newborn Data: Birth date:01/08/2022  Birth time:11:01 AM  Gender:Female  Living status:Living  Apgars:9 ,9  Weight:4150 g     Magnesium Sulfate received: No BMZ received: No Rhophylac:N/A MMR:N/A T-DaP:Given prenatally Flu: No Transfusion:No  Physical exam  Vitals:   01/09/22 0018 01/09/22 0545 01/09/22 0841 01/09/22 1503  BP: 112/61 (!) 105/56 114/60 125/76  Pulse: 77 74 74 77  Resp: _0 Temp: 97.7 F (36.5 C) 98.1 F (36.7 C) 98.1 F (36.7  C) 99.5 F (37.5 C)  TempSrc: Oral Oral Oral Oral  SpO2: 98% 98% 100% 100%  Weight:      Height:       General: alert, cooperative, and no distress Lochia: appropriate Uterine Fundus: firm Incision: Healing well with no significant drainage, No significant erythema, Dressing is clean, dry, and intact DVT Evaluation: No evidence of DVT seen on physical exam. Labs: Lab Results  Component Value Date   WBC 9.9 01/09/2022   HGB 10.8 (L) 01/09/2022   HCT 31.9 (L) 01/09/2022   MCV 77.6 (L) 01/09/2022   PLT 202 01/09/2022      Latest Ref Rng & Units 01/09/2022    5:16 AM  CMP  Glucose 70 - 99 mg/dL 122    Edinburgh Score:    01/08/2022    9:00 PM  Edinburgh Postnatal Depression Scale Screening Tool  I have been able to laugh and see the funny side of things. 0  I have looked forward with enjoyment to things. 0  I have blamed myself unnecessarily when things went wrong. 0  I have been anxious or worried for no good reason. 0  I have felt scared or panicky for no good reason. 0  Things have been getting on top of me. 0  I have been so unhappy that I have had difficulty sleeping. 0  I have felt sad or miserable. 0  I have been so unhappy that I  have been crying. 0  The thought of harming myself has occurred to me. 0  Edinburgh Postnatal Depression Scale Total 0     After visit meds:  Allergies as of 01/10/2022   No Known Allergies      Medication List     STOP taking these medications    Accu-Chek Guide test strip Generic drug: glucose blood   Accu-Chek Guide w/Device Kit   Accu-Chek Softclix Lancets lancets   diphenhydrAMINE 25 MG tablet Commonly known as: BENADRYL   ferrous sulfate 325 (65 FE) MG EC tablet   metFORMIN 500 MG tablet Commonly known as: GLUCOPHAGE       TAKE these medications    acetaminophen 500 MG tablet Commonly known as: TYLENOL Take 2 tablets (1,000 mg total) by mouth every 6 (six) hours.   ibuprofen 600 MG tablet Commonly  known as: ADVIL Take 1 tablet (600 mg total) by mouth every 6 (six) hours.   oxyCODONE 5 MG immediate release tablet Commonly known as: Oxy IR/ROXICODONE Take 1 tablet (5 mg total) by mouth every 4 (four) hours as needed for moderate pain.   prenatal multivitamin Tabs tablet Take 1 tablet by mouth daily at 12 noon.         Discharge home in stable condition Infant Feeding: Bottle Infant Disposition:home with mother Discharge instruction: per After Visit Summary and Postpartum booklet. Activity: Advance as tolerated. Pelvic rest for 6 weeks.  Diet: routine diet Future Appointments: Future Appointments  Date Time Provider [place]  01/19/2022  8:30 AM [facility] NURSE [facility]  02/09/2022  8:50 AM [facility] LAB [facility]  02/09/2022  9:15 AM Chancy Milroy, MD [facility]   Follow up Visit: Message [facility] on 11/16  Please schedule this patient for a In person postpartum visit in 6 weeks with the following provider: Any provider. Additional Postpartum F/U:2 hour GTT and Incision check 1 week  High risk pregnancy complicated by: GDM Delivery mode:  C-Section, Low Transverse  Anticipated Birth Control:  Depo   01/10/2022 Naaman Plummer Autry-Lott, DO

## 2022-01-08 NOTE — Anesthesia Procedure Notes (Signed)
Epidural Patient location during procedure: OB Start time: 01/08/2022 10:30 AM End time: 01/08/2022 10:34 AM  Staffing Anesthesiologist: Linton Rump, MD Performed: anesthesiologist   Preanesthetic Checklist Completed: patient identified, IV checked, site marked, risks and benefits discussed, surgical consent, monitors and equipment checked, pre-op evaluation and timeout performed  Epidural Patient position: sitting Prep: DuraPrep and site prepped and draped Patient monitoring: continuous pulse ox and blood pressure Approach: midline Location: L3-L4 Injection technique: LOR air  Needle:  Needle type: Tuohy  Needle gauge: 17 G Needle length: 9 cm and 9 Needle insertion depth: 5 cm Catheter type: closed end flexible Catheter size: 19 Gauge Catheter at skin depth: 9 cm Test dose: negative  Assessment Sensory level: T4 Events: blood not aspirated, injection not painful, no injection resistance, no paresthesia and negative IV test  Additional Notes CSE is being performed for surgical anesthesia. Risks and benefits including, but not limited to, infection, bleeding, local anesthetic toxicity, headache, hypotension, back pain, block failure, etc. were discussed with the patient. The patient expressed understanding and consented to the procedure. I confirmed that the patient has no bleeding disorders and is not taking blood thinners. I confirmed the patient's last platelet count with the nurse. A time-out was performed immediately prior to the procedure. Sterile technique was used throughout the whole procedure. Once LOR achieved, a spinal needle was passed through the Tuohy needle with good CSF return. The epidural catheter was then threaded easily without resistance. Aspiration of the catheter was negative for blood and CSF.   Reason for block:surgical anesthesia

## 2022-01-08 NOTE — Transfer of Care (Signed)
Immediate Anesthesia Transfer of Care Note  Patient: Colleen Wells  Procedure(s) Performed: CESAREAN SECTION  Patient Location: PACU  Anesthesia Type:Spinal  Level of Consciousness: awake  Airway & Oxygen Therapy: Patient Spontanous Breathing  Post-op Assessment: Report given to RN  Post vital signs: Reviewed and stable  Last Vitals:  Vitals Value Taken Time  BP    Temp    Pulse 76 01/08/22 1147  Resp    SpO2 100 % 01/08/22 1147  Vitals shown include unvalidated device data.  Last Pain: There were no vitals filed for this visit.       Complications: No notable events documented.

## 2022-01-08 NOTE — Anesthesia Postprocedure Evaluation (Signed)
Anesthesia Post Note  Patient: Colleen Wells  Procedure(s) Performed: CESAREAN SECTION     Patient location during evaluation: PACU Anesthesia Type: Spinal Level of consciousness: awake Pain management: pain level controlled Vital Signs Assessment: post-procedure vital signs reviewed and stable Respiratory status: spontaneous breathing, respiratory function stable and nonlabored ventilation Cardiovascular status: blood pressure returned to baseline and stable Postop Assessment: no headache, no backache and no apparent nausea or vomiting Anesthetic complications: no   No notable events documented.  Last Vitals:  Vitals:   01/08/22 1230 01/08/22 1308  BP: (!) 112/59 113/61  Pulse: 69 68  Resp: 16 17  Temp: 36.8 C (!) 36.4 C  SpO2: 99% 100%    Last Pain:  Vitals:   01/08/22 1308  TempSrc: Oral  PainSc:    Pain Goal:                   Linton Rump

## 2022-01-08 NOTE — Op Note (Addendum)
Cesarean Section Operative Report  [name]  01/08/2022  Indications: history of prior cesarean (five priors)  Pre-operative Diagnosis: repeat low transverse cesarean section  Post-operative Diagnosis: Same   Surgeon: Surgeon(s) and Role:    * Audriana Aldama, Wilfred Curtis, MD - assisting    * Constant, Gigi Gin, MD - primary      Franki Cabot, MD - assisting   Attending Attestation: I was present and scrubbed for the entire procedure.   An experienced assistant was required given the standard of surgical care given the complexity of the case.  This assistant was needed for exposure, dissection, suctioning, retraction, instrument exchange, assisting with delivery with administration of fundal pressure, and for overall help during the procedure.  Anesthesia: spinal    Quantified Blood Loss: 180 ml  Total IV Fluids: 2300 ml LR  Urine Output:: 200 ml clear yellow urine  Specimens: none  Findings: Viable female infant in cephalic presentation; Apgars pending; weight pending; arterial cord pH not obtained;  clear amniotic fluid; intact placenta with three vessel cord; normal uterus, fallopian tubes and ovaries bilaterally. Significant diastasis recti, very thin lower uterine segment.   Baby condition / location:  Couplet care / Skin to Skin   Complications: no complications  Indications: Colleen Wells is a 36 y.o. T4H9622 with an IUP [redacted]w[redacted]d presenting for scheduled repeat cesarean section at this gestational age for uncontrolled A2GDM.  The risks, benefits, complications, treatment options, and exected outcomes were discussed with the patient . The patient dwith the proposed plan, giving informed consent. identified as Colleen Wells and the procedure verified as C-Section Delivery.  Procedure Details:  The patient was taken back to the operative suite where spinal anesthesia was placed.  A time out was held and the above information confirmed.   After induction of  anesthesia, the patient was draped and prepped in the usual sterile manner and placed in a dorsal supine position with a leftward tilt. A Pfannenstiel incision was made and carried down through the subcutaneous tissue to the fascia. We were are of significant diastasis recti from prior op note and so were careful to make very lateral fascial incisions. Fascial incision was made and bluntly extended transversely. The fascia was separated from the underlying rectus tissue superiorly and inferiorly. Peritoneum was entered when we did this and we then extended it bluntly. Alexis retractor was placed. A bladder flap was created. Very thin lower uterine segment noted so we made a more superior incision than normal. A low transverse uterine incision was made and extended bluntly. Delivered from cephalic presentation was a viable infant with Apgars and weight as above.  After waiting 60 seconds for delayed cord cutting, the umbilical cord was clamped and cut cord blood was obtained for evaluation. Cord ph was not sent. The placenta was removed Intact and appeared normal. The uterine outline, tubes and ovaries appeared normal. The uterine incision was closed with running unlocked sutures 0-Vicryl in one layer.  Hemostasis was observed. The peritoneum was closed with 2-0 vicryl. The rectus muscles were examined and hemostasis observed. The fascia was then reapproximated with running sutures of 0-Vicryl. The skin was closed with 4-0 Vicryl.  Instrument, sponge, and needle counts were correct prior the abdominal closure and were correct at the conclusion of the case.     Disposition: PACU - hemodynamically stable.   Maternal Condition: stable       Signed: Cherrie Gauze WoukMD 01/08/2022 1:13 PM

## 2022-01-08 NOTE — H&P (Addendum)
LABOR AND DELIVERY ADMISSION HISTORY AND PHYSICAL NOTE  Colleen Wells is a 36 y.o. female [phone] with IUP at 28w6dby L/19 presenting for scheduled repeat cesarean section in the setting of possibly uncontrolled A2GDM with an LGA fetus..   She reports positive fetal movement. She denies leakage of fluid or vaginal bleeding.  Prenatal History/Complications:  Past Medical History: Past Medical History:  Diagnosis Date   Arthrofibrosis of knee joint    right   Gestational diabetes    Meniscus tear    Prediabetes 12/26/2020   Pregnancy induced hypertension     Past Surgical History: Past Surgical History:  Procedure Laterality Date   ANTERIOR CRUCIATE LIGAMENT REPAIR Right 09/03/2015   Procedure: REPAIR ANTERIOR CRUCIATE LIGAMENT  REPAIR OF AVULSION;  Surgeon: SMeredith Pel MD;  [facility]  Service: Orthopedics;  Laterality: Right;   CESAREAN SECTION     x4   CESAREAN SECTION N/A 01/29/2019   Procedure: CESAREAN SECTION;  Surgeon: DEmily Filbert MD;  Location: [facility];  Service: Obstetrics;  Laterality: N/A;   KNEE CLOSED REDUCTION Right 10/08/2015   Procedure: CLOSED MANIPULATION KNEE UNDER ANESTHESIA;  Surgeon: SMeredith Pel MD;  [facility]  Service: Orthopedics;  Laterality: Right;   MENISCUS REPAIR Right 09/03/2015   Procedure: REPAIR OF MENISCUS LATERAL;  Surgeon: SMeredith Pel MD;  Location: [facility]  Service: Orthopedics;  Laterality: Right;    Obstetrical History: OB History     Gravida  7   Para  5   Term  5   Preterm      AB  1   Living  5      SAB  1   IAB      Ectopic      Multiple  0   Live Births  5           Social History: Social History   Socioeconomic History   Marital status: Single    Spouse name: Not on file   Number of children: 6   Years of education: Not on file   Highest education level: High school graduate  Occupational History   Not on file  Tobacco Use   Smoking status: Never    Smokeless tobacco: Never  Vaping Use   Vaping Use: Never used  Substance and Sexual Activity   Alcohol use: No   Drug use: No   Sexual activity: Not Currently    Birth control/protection: None    Comment: depo post partem  Other Topics Concern   Not on file  Social History Narrative   Not on file   Social Determinants of Health   Financial Resource Strain: Low Risk  (11/29/2018)   Overall Financial Resource Strain (CARDIA)    Difficulty of Paying Living Expenses: Not hard at all  Food Insecurity: No Food Insecurity (01/08/2022)   Hunger Vital Sign    Worried About Running Out of Food in the Last Year: Never true    R[place] the Last Year: Never true  Transportation Needs: No Transportation Needs (01/08/2022)   PRAPARE - THydrologist(Medical): No    Lack of Transportation (Non-Medical): No  Physical Activity: Not on file  Stress: No Stress Concern Present (11/29/2018)   [place]   Feeling of Stress : Not at all  Social Connections: Unknown (11/29/2018)   Social Connection and Isolation Panel [NHANES]  Frequency of Communication with Friends and Family: Not on file    Frequency of Social Gatherings with Friends and Family: Not on file    Attends Religious Services: Not on file    Active Member of Clubs or Organizations: Not on file    Attends Archivist Meetings: Not on file    Marital Status: Never married    Family History: Family History  Problem Relation Age of Onset   Cancer Mother    Healthy Mother    Healthy Father    Asthma Neg Hx    Birth defects Neg Hx    Diabetes Neg Hx    Heart disease Neg Hx    Hypertension Neg Hx     Allergies: No Known Allergies  Medications Prior to Admission  Medication Sig Dispense Refill Last Dose   diphenhydrAMINE (BENADRYL) 25 MG tablet Take 25 mg by mouth every 6 (six) hours as needed for allergies.       ferrous sulfate 325 (65 FE) MG EC tablet Take 1 tablet (325 mg total) by mouth every other day. 30 tablet 2 01/07/2022   Prenatal Vit-Fe Fumarate-FA (PRENATAL MULTIVITAMIN) TABS tablet Take 1 tablet by mouth daily at 12 noon.   01/07/2022   Accu-Chek Softclix Lancets lancets Use as instructed 100 each 12    Blood Glucose Monitoring Suppl (ACCU-CHEK GUIDE) w/Device KIT 1 Device by Does not apply route in the morning, at noon, in the evening, and at bedtime. 1 kit 0    glucose blood (ACCU-CHEK GUIDE) test strip Use as instructed 100 each 12    metFORMIN (GLUCOPHAGE) 500 MG tablet Take by mouth 2 (two) times daily with a meal. (Patient not taking: Reported on 01/06/2022)   Not Taking     Review of Systems   All systems reviewed and negative except as stated in HPI  Blood pressure (!) 123/23, temperature 98.2 F (36.8 C), resp. rate 18, height _0  (1.727 m), weight 97.2 kg, last menstrual period 04/18/2021, SpO2 99 %, not currently breastfeeding. General appearance: alert, cooperative, and appears stated age Lungs: clear to auscultation bilaterally Heart: regular rate and rhythm Abdomen: soft, non-tender; bowel sounds normal Extremities: No calf swelling or tenderness Presentation: cephalic Fetal monitoring: 150s Uterine activity: quiet     Prenatal labs: ABO, Rh: --/--/O POS (11/15 1043) Antibody: NEG (11/15 1043) Rubella: Immune (05/01 0000) RPR: NON REACTIVE (11/15 1036)  HBsAg: Negative (05/01 0000)  HIV: Non Reactive (09/13 1431)  GBS: Positive/-- (11/08 1656)  1 hr Glucola: markedly elevated (194) Genetic screening:  normal Anatomy US: wnl  Prenatal Transfer Tool  Maternal Diabetes: Yes:  Diabetes Type:  Insulin/Medication controlled Genetic Screening: Normal Maternal Ultrasounds/Referrals: Normal Fetal Ultrasounds or other Referrals:  None Maternal Substance Abuse:  No Significant Maternal Medications:  metformin Significant Maternal Lab Results: Group B Strep  positive  Results for orders placed or performed during the hospital encounter of 01/08/22 (from the past 24 hour(s))  Glucose, capillary   Collection Time: 01/08/22  9:12 AM  Result Value Ref Range   Glucose-Capillary 99 70 - 99 mg/dL  Results for orders placed or performed during the hospital encounter of 01/07/22 (from the past 24 hour(s))  CBC   Collection Time: 01/07/22 10:36 AM  Result Value Ref Range   WBC 9.6 4.0 - 10.5 K/uL   RBC 4.41 3.87 - 5.11 MIL/uL   Hemoglobin 11.1 (L) 12.0 - 15.0 g/dL   HCT 35.0 (L) 36.0 - 46.0 %   MCV 79.4 (  L) 80.0 - 100.0 fL   MCH 25.2 (L) 26.0 - 34.0 pg   MCHC 31.7 30.0 - 36.0 g/dL   RDW 18.7 (H) 11.5 - 15.5 %   Platelets 220 150 - 400 K/uL   nRBC 0.0 0.0 - 0.2 %  RPR   Collection Time: 01/07/22 10:36 AM  Result Value Ref Range   RPR Ser Ql NON REACTIVE NON REACTIVE  Type and screen   Collection Time: 01/07/22 10:43 AM  Result Value Ref Range   ABO/RH(D) O POS    Antibody Screen NEG    Sample Expiration      01/10/2022,2359 Performed at [facility], [place] [address] [place], [place] [address]     Patient Active Problem List   Diagnosis Date Noted   LGA (large for gestational age) fetus affecting management of mother 01/08/2022   GBS (group B Streptococcus carrier), +RV culture, currently pregnant 01/05/2022   Anemia affecting pregnancy in third trimester 11/06/2021   Unwanted fertility 11/05/2021   Anxiety 11/05/2021   Diabetes mellitus in pregnancy 08/11/2021   Supervision of high risk pregnancy, antepartum 07/09/2021   Genetic carrier 01/16/2019   History of C-section 11/29/2018    Assessment: Bayler Nehring is a 36 y.o. [id number] at 16w6dhere for scheduled repeat cesarean section given history 5 prior cesarean sections. Performing at this gestational age given concern for uncontrolled A2GDM (fastings in the low 100s, and fetus is LGA).    The risks of cesarean section were discussed with the patient including but were  not limited to: bleeding which may require transfusion or reoperation; infection which may require antibiotics; injury to bowel, bladder, ureters or other surrounding organs; injury to the fetus; need for additional procedures including hysterectomy in the event of a life-threatening hemorrhage; placental abnormalities wth subsequent pregnancies, incisional problems, thromboembolic phenomenon and other postoperative/anesthesia complications.  As this is her 6th cesarean, I did emphasize the increased risks associated with this and subsequent surgeries. The patient concurred with the proposed plan, giving informed written consent for the procedures.  Patient has been NPO since midnight she will remain NPO for procedure. Anesthesia and OR aware.  Preoperative prophylactic antibiotics and SCDs ordered on call to the OR.  To OR when ready.  # A2GDM: glucose this morning 99. Will plan to monitor post-partum and likely repeat GTT at 6+ weeks  # LGA fetus: extrapolated EFW [id number]  # GBS pos: no indication for ppx   # circ: yes, inpatient  # Feeding: bottle  # Contraception: depo, consider giving prior to d/c    NDesma Maxim11/16/2023, 9:34 AM

## 2022-01-09 ENCOUNTER — Encounter: Payer: Medicaid Other | Admitting: Obstetrics and Gynecology

## 2022-01-09 LAB — CBC
HCT: 31.9 % — ABNORMAL LOW (ref 36.0–46.0)
Hemoglobin: 10.8 g/dL — ABNORMAL LOW (ref 12.0–15.0)
MCH: 26.3 pg (ref 26.0–34.0)
MCHC: 33.9 g/dL (ref 30.0–36.0)
MCV: 77.6 fL — ABNORMAL LOW (ref 80.0–100.0)
Platelets: 202 10*3/uL (ref 150–400)
RBC: 4.11 MIL/uL (ref 3.87–5.11)
RDW: 18.6 % — ABNORMAL HIGH (ref 11.5–15.5)
WBC: 9.9 10*3/uL (ref 4.0–10.5)
nRBC: 0 % (ref 0.0–0.2)

## 2022-01-09 LAB — GLUCOSE, CAPILLARY: Glucose-Capillary: 120 mg/dL — ABNORMAL HIGH (ref 70–99)

## 2022-01-09 LAB — GLUCOSE, RANDOM: Glucose, Bld: 122 mg/dL — ABNORMAL HIGH (ref 70–99)

## 2022-01-09 NOTE — Progress Notes (Signed)
Circumcision Consent   -Circumcision procedure details discussed including usage of local anesthesia, infant soother, and removal and disposal of foreskin. -Risks and benefits of procedure were reviewed including, but not limited to:  *Benefits include reduction in the rates of urinary tract infection (UTI), penile cancer, some sexually transmitted infections, penile inflammatory, and retractile disorders, as well as easier hygiene.   *Risks include bleeding, infection, injury of glans which may lead to need for additional surgery, penile deformity, or urinary tract issues, unsatisfactory cosmetic appearance and other potential complications related to the procedure.   -Informed that procedure will not be performed if provider deems inappropriate d/t penile size, noted deformity, or unsatisfactory pediatric evaluation. -It was emphasized that this is an elective procedure.   -Post circumcision care discussed.   Patient wants to proceed with circumcision. Informed that team would be notified and complete prior to discharge and upon satisfactory pediatric evaluation of infant.    Myrtie Hawk, DO FMOB Fellow, Faculty practice [facility], [facility] for [facility] 8:52 AM

## 2022-01-09 NOTE — Progress Notes (Signed)
CSW received consult for hx of Anxiety.  CSW met with MOB to offer support and complete assessment, FOB present. CSW introduced self. MOB granted CSW verbal permission to speak in front of FOB about anything. CSW explained reason for consult. MOB was polite and remained engaged during assessment. CSW and MOB discussed MOB's mental health history. MOB reported that she has not been diagnosed with anxiety but shared about her experiencing anxiety in 2020 during her last pregnancy. MOB described her anxiety as over thinking, excessive worry, heart racing, and crying. MOB denied any current symptoms. MOB reported that she is not taking any medication nor participating in therapy to treat anxiety. MOB denied needing any mental health resources. MOB endorsed a history of postpartum depression and shared that it lasted a month or two. MOB reported that she was prescribed medication but did not take it. MOB denied any additional mental health history. CSW inquired about how MOB was feeling emotionally since giving birth, MOB reported that she was feeling okay. MOB presented calm and did not demonstrate any acute mental health signs/symptoms. CSW assessed for safety, MOB denied SI and HI. CSW did not assess for domestic violence as FOB was present. CSW inquired about MOB's support system, MOB reported that her mom, brother, and FOB are supports.    CSW provided education regarding the baby blues period vs. perinatal mood disorders, discussed treatment and gave resources for mental health follow up if concerns arise.  CSW recommends self-evaluation during the postpartum time period using the New Mom Checklist from Postpartum Progress and encouraged MOB to contact a medical professional if symptoms are noted at any time.    CSW provided review of Sudden Infant Death Syndrome (SIDS) precautions. MOB verbalized understanding and reported having all items needed to care for infant including a car seat and crib.   CSW  identifies no further need for intervention and no barriers to discharge at this time.  Abundio Miu, [place] Worker [facility] Cell#: [phone]

## 2022-01-10 MED ORDER — OXYCODONE HCL 5 MG PO TABS: 5.0000 mg | ORAL_TABLET | ORAL | 0 refills | Status: DC | PRN

## 2022-01-10 MED ORDER — IBUPROFEN 600 MG PO TABS: 600.0000 mg | ORAL_TABLET | Freq: Four times a day (QID) | ORAL | 0 refills | Status: DC

## 2022-01-10 MED ORDER — ACETAMINOPHEN 500 MG PO TABS: 1000.0000 mg | ORAL_TABLET | Freq: Four times a day (QID) | ORAL | 0 refills | Status: DC

## 2022-01-10 MED ORDER — MEDROXYPROGESTERONE ACETATE 150 MG/ML IM SUSP
150.0000 mg | Freq: Once | INTRAMUSCULAR | Status: AC
  Administered 2022-01-10: 150 mg via INTRAMUSCULAR
  Filled 2022-01-10: qty 1

## 2022-01-13 LAB — BIRTH TISSUE RECOVERY COLLECTION (PLACENTA DONATION)

## 2022-01-14 ENCOUNTER — Other Ambulatory Visit (HOSPITAL_COMMUNITY): Admission: RE | Admit: 2022-01-14 | Payer: Medicaid Other | Source: Ambulatory Visit

## 2022-01-14 ENCOUNTER — Encounter: Payer: Self-pay | Admitting: Obstetrics & Gynecology

## 2022-01-16 ENCOUNTER — Telehealth (HOSPITAL_COMMUNITY): Payer: Self-pay | Admitting: *Deleted

## 2022-01-16 NOTE — Telephone Encounter (Signed)
Patient complaining of (L) hip pain - reported having spoken to nurse at [facility] office. Stated that pain has not worsened, but no better since that phone call. Has OB appointment scheduled for Monday for incision check and will ask MD to evaluate at that time. Patient voiced no other questions or concerns regarding her health at this time. EPDS=0. Patient voiced no questions or concerns regarding infant at this time. Patient reports infant sleeps in a bassinet on his back. RN reviewed ABCs of safe sleep. Patient verbalized understanding. Patient informed about hospital's virtual postpartum classes and support groups. Declined email information at this time. Deforest Hoyles, RN, 01/16/22, [phone]

## 2022-01-19 ENCOUNTER — Other Ambulatory Visit: Payer: Self-pay

## 2022-01-19 ENCOUNTER — Ambulatory Visit (INDEPENDENT_AMBULATORY_CARE_PROVIDER_SITE_OTHER): Payer: Medicaid Other

## 2022-01-19 VITALS — BP 139/79 | HR 66 | Ht 68.0 in | Wt 212.2 lb

## 2022-01-19 DIAGNOSIS — Z5189 Encounter for other specified aftercare: Secondary | ICD-10-CM

## 2022-01-19 NOTE — Progress Notes (Signed)
Incision Check Visit  Colleen Wells is here for incision check following repeat c-section on 01/08/22.   Assessment: Incision site healing, with no redness or tenderness around incision. Patient reports light serous drainage, none noted today. Patient reports no pain related to incision--patient was experiencing pain in hip r/t gas and constipation; since taking miralax and resuming previous BM pattern, patient reports decreased pain in hip.   Education: Reviewed good wound care and s/s of infection with patient. Encouraged patient to give [place] a call if hip pain returned or worsened since taking miralax and reminded patient of PP visit on 02/09/22. Patient verbalized understanding.   Patient will follow up post partum visit .  Meryl Crutch, RN 01/19/2022  8:24 AM

## 2022-01-21 ENCOUNTER — Encounter: Payer: Self-pay | Admitting: Family Medicine

## 2022-01-27 ENCOUNTER — Telehealth: Payer: Medicaid Other | Admitting: Nurse Practitioner

## 2022-01-27 ENCOUNTER — Encounter: Payer: Medicaid Other | Admitting: Nurse Practitioner

## 2022-01-27 DIAGNOSIS — J029 Acute pharyngitis, unspecified: Secondary | ICD-10-CM | POA: Diagnosis not present

## 2022-01-27 DIAGNOSIS — H10023 Other mucopurulent conjunctivitis, bilateral: Secondary | ICD-10-CM

## 2022-01-27 MED ORDER — OFLOXACIN 0.3 % OP SOLN: 1.0000 [drp] | Freq: Four times a day (QID) | OPHTHALMIC | 0 refills | Status: AC

## 2022-01-27 NOTE — Progress Notes (Signed)
E-Visit for Pink Eye & Sore Throat    We are sorry that you are not feeling well.  Here is how we plan to help!  Based on what you have shared with me it looks like you have conjunctivitis.  Conjunctivitis is a common inflammatory or infectious condition of the eye that is often referred to as "pink eye".  In most cases it is contagious (viral or bacterial). However, not all conjunctivitis requires antibiotics (ex. Allergic).  We have made appropriate suggestions for you based upon your presentation.  Since your sore throat is not associated with a fever it is likely viral pharyngitis. This is managed best with over the counter pain medication (tylenol or ibuprofen) and sore throat spray or Cepacol lozenges that you can also purchase over the counter from any pharmacy. If you develop a fever or the sore throat persists please schedule a follow up appointment.  For your pink eye:  I have prescribed Oflaxacin 1-2 drops 4 times a day times 5 days   Pink eye can be highly contagious.  It is typically spread through direct contact with secretions, or contaminated objects or surfaces that one may have touched.  Strict handwashing is suggested with soap and water is urged.  If not available, use alcohol based had sanitizer.  Avoid unnecessary touching of the eye.  If you wear contact lenses, you will need to refrain from wearing them until you see no white discharge from the eye for at least 24 hours after being on medication.  You should see symptom improvement in 1-2 days after starting the medication regimen.  Call us if symptoms are not improved in 1-2 days.  Home Care: Wash your hands often! Do not wear your contacts until you complete your treatment plan. Avoid sharing towels, bed linen, personal items with a person who has pink eye. See attention for anyone in your home with similar symptoms.  Get Help Right Away If: Your symptoms do not improve. You develop blurred or loss of vision. Your  symptoms worsen (increased discharge, pain or redness)   Thank you for choosing an e-visit.  Your e-visit answers were reviewed by a board certified advanced clinical practitioner to complete your personal care plan. Depending upon the condition, your plan could have included both over the counter or prescription medications.  Please review your pharmacy choice. Make sure the pharmacy is open so you can pick up prescription now. If there is a problem, you may contact your provider through [employer] and have the prescription routed to another pharmacy.  Your safety is important to [place]. If you have drug allergies check your prescription carefully.   For the next 24 hours you can use MyChart to ask questions about today's visit, request a non-urgent call back, or ask for a work or school excuse. You will get an email in the next two days asking about your experience. I hope that your e-visit has been valuable and will speed your recovery.  Meds ordered this encounter  Medications   ofloxacin (OCUFLOX) 0.3 % ophthalmic solution    Sig: Place 1 drop into both eyes 4 (four) times daily for 5 days.    Dispense:  5 mL    Refill:  0    I spent approximately 7 minutes reviewing the patient's history, current symptoms and coordinating their plan of care today.

## 2022-01-27 NOTE — Progress Notes (Signed)
Duplicate visit  

## 2022-01-28 ENCOUNTER — Encounter: Payer: Medicaid Other | Admitting: Family Medicine

## 2022-01-28 DIAGNOSIS — Z1152 Encounter for screening for COVID-19: Secondary | ICD-10-CM | POA: Diagnosis not present

## 2022-01-29 ENCOUNTER — Other Ambulatory Visit: Payer: Self-pay

## 2022-02-02 ENCOUNTER — Other Ambulatory Visit: Payer: Self-pay

## 2022-02-02 DIAGNOSIS — O24819 Other pre-existing diabetes mellitus in pregnancy, unspecified trimester: Secondary | ICD-10-CM

## 2022-02-03 ENCOUNTER — Telehealth: Payer: Self-pay

## 2022-02-03 DIAGNOSIS — Z1152 Encounter for screening for COVID-19: Secondary | ICD-10-CM | POA: Diagnosis not present

## 2022-02-03 NOTE — Telephone Encounter (Addendum)
[facility] called stating pt's BP is 150/90 RA and 140/90 LA no sx's and is not taking meds.    Pt scheduled on 02/04/22 for BP check with nurse.    Leonette Nutting

## 2022-02-04 ENCOUNTER — Other Ambulatory Visit: Payer: Self-pay

## 2022-02-04 ENCOUNTER — Ambulatory Visit (INDEPENDENT_AMBULATORY_CARE_PROVIDER_SITE_OTHER): Payer: Medicaid Other

## 2022-02-04 ENCOUNTER — Other Ambulatory Visit: Payer: Self-pay | Admitting: Obstetrics and Gynecology

## 2022-02-04 VITALS — BP 139/95 | HR 72

## 2022-02-04 DIAGNOSIS — O165 Unspecified maternal hypertension, complicating the puerperium: Secondary | ICD-10-CM

## 2022-02-04 DIAGNOSIS — Z3A00 Weeks of gestation of pregnancy not specified: Secondary | ICD-10-CM

## 2022-02-04 MED ORDER — NIFEDIPINE ER OSMOTIC RELEASE 30 MG PO TB24: 30.0000 mg | ORAL_TABLET | Freq: Every day | ORAL | 0 refills | Status: DC

## 2022-02-04 NOTE — Progress Notes (Signed)
Blood Pressure Check Visit  Colleen Wells is here for blood pressure check following C-section without labor on 01/08/2022. Pt did not have any issues with high blood pressure during pregnancy or immediately postpartum. [facility] called the office (12/12) to report pt BP 150/90, 140/90 during home visit. BP today is 148/90, 139/95. Patient denies any dizzness blurred vision headache chest pain shortness of breath peripheral edema. Patient is not currently prescribed any medication for BP. Reviewed with Hazle Coca MD. He advised prescribing 30mg  Procardia daily with BP check in one week. Patient is currently scheduled for PP visit with Dr. 12/18. Pt agreeable with plan of care and denies further questions.   1/19, RN 02/04/2022  2:30 PM

## 2022-02-09 ENCOUNTER — Other Ambulatory Visit: Payer: Self-pay

## 2022-02-09 ENCOUNTER — Ambulatory Visit (INDEPENDENT_AMBULATORY_CARE_PROVIDER_SITE_OTHER): Payer: Medicaid Other | Admitting: Obstetrics and Gynecology

## 2022-02-09 ENCOUNTER — Encounter: Payer: Self-pay | Admitting: Obstetrics and Gynecology

## 2022-02-09 ENCOUNTER — Other Ambulatory Visit: Payer: Medicaid Other

## 2022-02-09 DIAGNOSIS — O24819 Other pre-existing diabetes mellitus in pregnancy, unspecified trimester: Secondary | ICD-10-CM

## 2022-02-09 DIAGNOSIS — Z309 Encounter for contraceptive management, unspecified: Secondary | ICD-10-CM | POA: Insufficient documentation

## 2022-02-09 DIAGNOSIS — O139 Gestational [pregnancy-induced] hypertension without significant proteinuria, unspecified trimester: Secondary | ICD-10-CM

## 2022-02-09 DIAGNOSIS — Z3042 Encounter for surveillance of injectable contraceptive: Secondary | ICD-10-CM | POA: Diagnosis not present

## 2022-02-09 DIAGNOSIS — O24410 Gestational diabetes mellitus in pregnancy, diet controlled: Secondary | ICD-10-CM

## 2022-02-09 NOTE — Progress Notes (Signed)
    [place] Partum Visit Note  Colleen Wells is a 36 y.o. [phone] female who presents for a postpartum visit. She is 4 weeks postpartum following a repeat cesarean section.  I have fully reviewed the prenatal and intrapartum course. The delivery was at 12w6dgestational weeks.  Anesthesia: epidural and spinal. [facility]. Baby is feeding by bottle - Similac Total Comfort . Bleeding no bleeding. Bowel function is normal. Bladder function is normal. Patient is not sexually active. Contraception method is Depo-Provera injections. Postpartum depression screening: negative.   The pregnancy intention screening data noted above was reviewed. Potential methods of contraception were discussed. The patient elected to proceed with No data recorded.    Health Maintenance Due  Topic Date Due   FOOT EXAM  Never done   OPHTHALMOLOGY EXAM  Never done   Diabetic kidney evaluation - Urine ACR  Never done   Hepatitis C Screening  Never done   Diabetic kidney evaluation - eGFR measurement  02/07/2020   HEMOGLOBIN A1C  01/09/2022    The following portions of the patient's history were reviewed and updated as appropriate: allergies, current medications, past family history, past medical history, past social history, past surgical history, and problem list.  Review of Systems Pertinent items noted in HPI and remainder of comprehensive ROS otherwise negative.  Objective:  LMP 04/18/2021    General:  alert   Breasts:  not indicated  Lungs: clear to auscultation bilaterally  Heart:  regular rate and rhythm, S1, S2 normal, no murmur, click, rub or gallop  Abdomen: soft, non-tender; bowel sounds normal; no masses,  no organomegaly   Wound well approximated incision  GU exam:  not indicated       Assessment:    There are no diagnoses linked to this encounter.  NL postpartum exam.  H/O A2DM GHTN  Plan:   Essential components of care per ACOG  recommendations:  1.  Mood and well being: Patient with negative depression screening today. Reviewed local resources for support.  - Patient tobacco use? No.   - hx of drug use? No.    2. Infant care and feeding:  -Patient currently breastmilk feeding? No.  -Social determinants of health (SDOH) reviewed in EPIC. No concerns  3. Sexuality, contraception and birth spacing - Patient does not want a pregnancy in the next year.  Desired family size is uncertain. - Reviewed reproductive life planning. Reviewed contraceptive methods based on pt preferences and effectiveness.  Depo Provera prior to discharge from hospital. - Discussed birth spacing of 18 months  4. Sleep and fatigue -Encouraged family/partner/community support of 4 hrs of uninterrupted sleep to help with mood and fatigue  5. Physical Recovery  - Discussed patients delivery and complications. She describes her labor as good. - Patient had a C-section.  - Patient has urinary incontinence? No. - Patient is safe to resume physical and sexual activity  6.  Health Maintenance - HM due items addressed Yes - Last pap smear  Diagnosis  Date Value Ref Range Status  11/29/2018 Molecular only (A)  Final   Pap smear not done at today's visit.  -Breast Cancer screening indicated? No.   7. Chronic Disease/Pregnancy Condition follow up: Gestational Diabetes and GDM  Glucola today Will continue with Procardia to the end of December Return in Jan for a BP check with nurse    MArlina Robes [place]for [place]

## 2022-02-09 NOTE — Patient Instructions (Signed)

## 2022-02-10 ENCOUNTER — Other Ambulatory Visit: Payer: Self-pay

## 2022-02-10 DIAGNOSIS — O2493 Unspecified diabetes mellitus in the puerperium: Secondary | ICD-10-CM

## 2022-02-10 DIAGNOSIS — Z1152 Encounter for screening for COVID-19: Secondary | ICD-10-CM | POA: Diagnosis not present

## 2022-02-10 LAB — GLUCOSE TOLERANCE, 2 HOURS
Glucose, 2 hour: 147 mg/dL — ABNORMAL HIGH (ref 70–139)
Glucose, GTT - Fasting: 111 mg/dL — ABNORMAL HIGH (ref 70–99)

## 2022-02-10 MED ORDER — METFORMIN HCL 500 MG PO TABS: 500.0000 mg | ORAL_TABLET | Freq: Two times a day (BID) | ORAL | 1 refills | Status: DC

## 2022-02-21 DIAGNOSIS — Z1152 Encounter for screening for COVID-19: Secondary | ICD-10-CM | POA: Diagnosis not present

## 2022-04-13 ENCOUNTER — Ambulatory Visit (INDEPENDENT_AMBULATORY_CARE_PROVIDER_SITE_OTHER): Payer: Medicaid Other

## 2022-04-13 ENCOUNTER — Other Ambulatory Visit: Payer: Self-pay

## 2022-04-13 VITALS — BP 142/96 | HR 90 | Ht 68.0 in | Wt 216.3 lb

## 2022-04-13 DIAGNOSIS — Z3042 Encounter for surveillance of injectable contraceptive: Secondary | ICD-10-CM

## 2022-04-13 DIAGNOSIS — Z3202 Encounter for pregnancy test, result negative: Secondary | ICD-10-CM | POA: Diagnosis not present

## 2022-04-13 MED ORDER — MEDROXYPROGESTERONE ACETATE 150 MG/ML IM SUSP
150.0000 mg | Freq: Once | INTRAMUSCULAR | Status: AC
  Administered 2022-04-13: 150 mg via INTRAMUSCULAR

## 2022-04-13 NOTE — Progress Notes (Signed)
Angelica Ran here for Depo-Provera Injection. Injection administered without complication. Patient will return in 3 months for next injection between May 7 and May 31. Next annual visit due 07/2022. Pt is late by two days.  UPT resulted negative.  Pt denies unprotected sex.  Pt presents with elevated BP.  Pt states that she has a PCP and will reach out to them to schedule an appt.    Verdell Carmine, RN 04/13/2022  2:32 PM

## 2022-04-17 DIAGNOSIS — Z3200 Encounter for pregnancy test, result unknown: Secondary | ICD-10-CM | POA: Diagnosis not present

## 2022-04-17 DIAGNOSIS — E669 Obesity, unspecified: Secondary | ICD-10-CM | POA: Diagnosis not present

## 2022-04-17 DIAGNOSIS — Z0000 Encounter for general adult medical examination without abnormal findings: Secondary | ICD-10-CM | POA: Diagnosis not present

## 2022-04-17 DIAGNOSIS — Z0130 Encounter for examination of blood pressure without abnormal findings: Secondary | ICD-10-CM | POA: Diagnosis not present

## 2022-04-17 DIAGNOSIS — Z6833 Body mass index (BMI) 33.0-33.9, adult: Secondary | ICD-10-CM | POA: Diagnosis not present

## 2022-04-17 DIAGNOSIS — E559 Vitamin D deficiency, unspecified: Secondary | ICD-10-CM | POA: Diagnosis not present

## 2022-04-27 DIAGNOSIS — R030 Elevated blood-pressure reading, without diagnosis of hypertension: Secondary | ICD-10-CM | POA: Diagnosis not present

## 2022-04-27 DIAGNOSIS — Z3200 Encounter for pregnancy test, result unknown: Secondary | ICD-10-CM | POA: Diagnosis not present

## 2022-04-27 DIAGNOSIS — R7303 Prediabetes: Secondary | ICD-10-CM | POA: Diagnosis not present

## 2022-04-27 DIAGNOSIS — Z0130 Encounter for examination of blood pressure without abnormal findings: Secondary | ICD-10-CM | POA: Diagnosis not present

## 2022-04-27 DIAGNOSIS — E559 Vitamin D deficiency, unspecified: Secondary | ICD-10-CM | POA: Diagnosis not present

## 2022-04-27 DIAGNOSIS — Z6832 Body mass index (BMI) 32.0-32.9, adult: Secondary | ICD-10-CM | POA: Diagnosis not present

## 2022-04-27 DIAGNOSIS — Z789 Other specified health status: Secondary | ICD-10-CM | POA: Diagnosis not present

## 2022-05-05 DIAGNOSIS — Z6832 Body mass index (BMI) 32.0-32.9, adult: Secondary | ICD-10-CM | POA: Diagnosis not present

## 2022-05-05 DIAGNOSIS — I10 Essential (primary) hypertension: Secondary | ICD-10-CM | POA: Diagnosis not present

## 2022-05-05 DIAGNOSIS — Z0130 Encounter for examination of blood pressure without abnormal findings: Secondary | ICD-10-CM | POA: Diagnosis not present

## 2022-05-05 DIAGNOSIS — R030 Elevated blood-pressure reading, without diagnosis of hypertension: Secondary | ICD-10-CM | POA: Diagnosis not present

## 2022-05-05 DIAGNOSIS — Z789 Other specified health status: Secondary | ICD-10-CM | POA: Diagnosis not present

## 2022-05-05 DIAGNOSIS — Z3200 Encounter for pregnancy test, result unknown: Secondary | ICD-10-CM | POA: Diagnosis not present

## 2022-07-02 ENCOUNTER — Ambulatory Visit: Payer: Medicaid Other

## 2022-07-29 ENCOUNTER — Ambulatory Visit (INDEPENDENT_AMBULATORY_CARE_PROVIDER_SITE_OTHER): Payer: Medicaid Other | Admitting: *Deleted

## 2022-07-29 ENCOUNTER — Other Ambulatory Visit: Payer: Self-pay

## 2022-07-29 VITALS — BP 140/87 | HR 80 | Ht 68.0 in | Wt 217.9 lb

## 2022-07-29 DIAGNOSIS — Z3042 Encounter for surveillance of injectable contraceptive: Secondary | ICD-10-CM

## 2022-07-29 NOTE — Progress Notes (Signed)
Pt presents for Depo Provera injection which was due 5/7-5/21. Last dose received 04/13/22 (15.2 wks ago). She reports last unprotected intercourse was 5/26 which was less than 2 weeks ago.  Pt was informed that Depo injection cannot be given today. She was instructed to abstain from intercourse or use condoms for the next 2 weeks. She may then receive the injection provided her UPT is negative on that Amandine Covino. Pt was very disappointed and stated that she did not feel pregnant. I acknowledged her disappointment and apologized for the inconvenience. I also explained the reason for not giving the injection today - due to risk of pregnancy and potential fetal deformities. Pt voiced understanding.

## 2022-08-12 ENCOUNTER — Other Ambulatory Visit: Payer: Self-pay

## 2022-08-12 ENCOUNTER — Ambulatory Visit (INDEPENDENT_AMBULATORY_CARE_PROVIDER_SITE_OTHER): Payer: Medicaid Other | Admitting: General Practice

## 2022-08-12 VITALS — BP 126/88 | HR 87 | Ht 68.0 in | Wt 214.0 lb

## 2022-08-12 DIAGNOSIS — Z3042 Encounter for surveillance of injectable contraceptive: Secondary | ICD-10-CM

## 2022-08-12 DIAGNOSIS — B379 Candidiasis, unspecified: Secondary | ICD-10-CM | POA: Diagnosis not present

## 2022-08-12 LAB — POCT PREGNANCY, URINE: Preg Test, Ur: NEGATIVE

## 2022-08-12 MED ORDER — MEDROXYPROGESTERONE ACETATE 150 MG/ML IM SUSY
150.0000 mg | PREFILLED_SYRINGE | Freq: Once | INTRAMUSCULAR | Status: AC
  Administered 2022-08-12: 150 mg via INTRAMUSCULAR

## 2022-08-12 MED ORDER — FLUCONAZOLE 150 MG PO TABS: 150.0000 mg | ORAL_TABLET | Freq: Once | ORAL | 0 refills | Status: AC

## 2022-08-12 NOTE — Progress Notes (Signed)
Colleen Wells here for Depo-Provera Injection. UPT collected as she is past due for depo- upt negative. Injection administered without complication. Patient will return in 3 months for next injection between 9/4 and 9/18. Next annual visit due December 2024. Discussed using back up protection for 2 weeks. Patient reports vaginal itching for the last week since switching her soaps- denies discharge or odor. Diflucan sent to pharmacy per protocol & patient informed.  Marylynn Pearson, RN 08/12/2022  4:08 PM

## 2022-10-22 ENCOUNTER — Telehealth: Payer: Medicaid Other | Admitting: Physician Assistant

## 2022-10-22 DIAGNOSIS — B3731 Acute candidiasis of vulva and vagina: Secondary | ICD-10-CM

## 2022-10-22 NOTE — Progress Notes (Signed)
I have spent 5 minutes in review of e-visit questionnaire, review and updating patient chart, medical decision making and response to patient.   William Cody Martin, PA-C    

## 2022-10-22 NOTE — Progress Notes (Signed)

## 2022-10-25 DIAGNOSIS — R300 Dysuria: Secondary | ICD-10-CM | POA: Diagnosis not present

## 2022-10-25 DIAGNOSIS — E6609 Other obesity due to excess calories: Secondary | ICD-10-CM | POA: Diagnosis not present

## 2022-10-25 DIAGNOSIS — Z7251 High risk heterosexual behavior: Secondary | ICD-10-CM | POA: Diagnosis not present

## 2022-10-25 DIAGNOSIS — N914 Secondary oligomenorrhea: Secondary | ICD-10-CM | POA: Diagnosis not present

## 2022-10-25 DIAGNOSIS — N898 Other specified noninflammatory disorders of vagina: Secondary | ICD-10-CM | POA: Diagnosis not present

## 2022-10-25 DIAGNOSIS — Z79899 Other long term (current) drug therapy: Secondary | ICD-10-CM | POA: Diagnosis not present

## 2022-10-28 NOTE — Progress Notes (Deleted)
Colleen Wells here for Depo-Provera Injection. Last dose given on 08/12/22; patient is [redacted]w[redacted]d since last injection--within window to receive next dose today. Injection administered without complication. Patient will return in 3 months for next injection between 01/14/23 and 01/28/23. Next annual visit due 02/10/23.   Meryl Crutch, RN 10/29/22 at

## 2022-10-29 ENCOUNTER — Ambulatory Visit: Payer: Medicaid Other

## 2022-11-20 DIAGNOSIS — Z7251 High risk heterosexual behavior: Secondary | ICD-10-CM | POA: Diagnosis not present

## 2022-11-20 DIAGNOSIS — Z79899 Other long term (current) drug therapy: Secondary | ICD-10-CM | POA: Diagnosis not present

## 2022-11-20 DIAGNOSIS — N914 Secondary oligomenorrhea: Secondary | ICD-10-CM | POA: Diagnosis not present

## 2022-11-20 DIAGNOSIS — N898 Other specified noninflammatory disorders of vagina: Secondary | ICD-10-CM | POA: Diagnosis not present

## 2022-11-20 DIAGNOSIS — E6609 Other obesity due to excess calories: Secondary | ICD-10-CM | POA: Diagnosis not present

## 2022-11-25 ENCOUNTER — Ambulatory Visit
Admission: EM | Admit: 2022-11-25 | Discharge: 2022-11-25 | Disposition: A | Discharge status: Home / Self Care | Payer: Medicaid Other | Attending: Internal Medicine | Admitting: Internal Medicine

## 2022-11-25 DIAGNOSIS — Z113 Encounter for screening for infections with a predominantly sexual mode of transmission: Secondary | ICD-10-CM | POA: Diagnosis not present

## 2022-11-25 LAB — POCT URINE PREGNANCY: Preg Test, Ur: NEGATIVE

## 2022-11-25 NOTE — ED Triage Notes (Signed)
Pt requesting STD testing with blood work. Denies sx's or exposure. States has a new partner.

## 2022-11-25 NOTE — ED Provider Notes (Signed)
[facility]  Note:  This document was prepared using Conservation officer, historic buildings and may include unintentional dictation errors.  MRN: [id number] DOB: Apr 10, 1985  Subjective:   Colleen Wells is a 37 y.o. female presenting for STD testing including bloodwork. Denies fever, n/v, abdominal pain, pelvic pain, rashes, dysuria, urinary frequency, hematuria, vaginal discharge.  Has a new sex partner. Would like an UPT as well. Is not on birth control and is irregular with her cycles.   No current facility-administered medications for this encounter.  Current Outpatient Medications:    losartan (COZAAR) 25 MG tablet, Take 25 mg by mouth daily., Disp: , Rfl:    metFORMIN (GLUCOPHAGE) 500 MG tablet, Take 1 tablet (500 mg total) by mouth 2 (two) times daily with a meal. (Patient not taking: Reported on 08/12/2022), Disp: 30 tablet, Rfl: 1   Vitamin D, Ergocalciferol, (DRISDOL) 1.25 MG (50000 UNIT) CAPS capsule, Take 50,000 Units by mouth every 7 (seven) days., Disp: , Rfl:    No Known Allergies  Past Medical History:  Diagnosis Date   Arthrofibrosis of knee joint    right   GBS (group B Streptococcus carrier), +RV culture, currently pregnant 01/05/2022   Genetic carrier 01/16/2019   Mat 21-neg, Fragile X-neg, SMA-at risk to be a silent carrier; FOB, Antton Sharpe had SMA drawn 09/19/18 (MRN [id number]   Gestational diabetes    Meniscus tear    Prediabetes 12/26/2020   Pregnancy induced hypertension    Status post repeat low transverse cesarean section 11/29/2018   Normal posterior placenta     Past Surgical History:  Procedure Laterality Date   ANTERIOR CRUCIATE LIGAMENT REPAIR Right 09/03/2015   Procedure: REPAIR ANTERIOR CRUCIATE LIGAMENT  REPAIR OF AVULSION;  Surgeon: Cammy Copa, MD;  Location: [facility] OR;  Service: Orthopedics;  Laterality: Right;   CESAREAN SECTION     x4   CESAREAN SECTION N/A 01/29/2019   Procedure: CESAREAN SECTION;   Surgeon: Allie Bossier, MD;  Location: [facility];  Service: Obstetrics;  Laterality: N/A;   CESAREAN SECTION N/A 01/08/2022   Procedure: CESAREAN SECTION;  Surgeon: Kathrynn Running, MD;  Location: [facility];  Service: Obstetrics;  Laterality: N/A;   KNEE CLOSED REDUCTION Right 10/08/2015   Procedure: CLOSED MANIPULATION KNEE UNDER ANESTHESIA;  Surgeon: Cammy Copa, MD;  Location: [facility] OR;  Service: Orthopedics;  Laterality: Right;   MENISCUS REPAIR Right 09/03/2015   Procedure: REPAIR OF MENISCUS LATERAL;  Surgeon: Cammy Copa, MD;  Location: [facility] OR;  Service: Orthopedics;  Laterality: Right;    Family History  Problem Relation Age of Onset   Cancer Mother    Healthy Mother    Healthy Father    Asthma Neg Hx    Birth defects Neg Hx    Diabetes Neg Hx    Heart disease Neg Hx    Hypertension Neg Hx     Social History   Tobacco Use   Smoking status: Never   Smokeless tobacco: Never  Vaping Use   Vaping status: Never Used  Substance Use Topics   Alcohol use: No   Drug use: No    ROS   Objective:   Vitals: BP (!) 148/96 (BP Location: Left Arm)   Pulse 69   Temp 99.5 F (37.5 C) (Oral)   Resp 18   SpO2 97%   Breastfeeding No   Physical Exam Constitutional:      General: She is not in acute distress.  Appearance: Normal appearance. She is well-developed. She is not ill-appearing, toxic-appearing or diaphoretic.  HENT:     Head: Normocephalic and atraumatic.     Nose: Nose normal.     Mouth/Throat:     Mouth: Mucous membranes are moist.  Eyes:     General: No scleral icterus.       Right eye: No discharge.        Left eye: No discharge.     Extraocular Movements: Extraocular movements intact.  Cardiovascular:     Rate and Rhythm: Normal rate.  Pulmonary:     Effort: Pulmonary effort is normal.  Skin:    General: Skin is warm and dry.  Neurological:     General: No focal deficit present.     Mental Status: She is alert and oriented to person,  place, and time.  Psychiatric:        Mood and Affect: Mood normal.        Behavior: Behavior normal.    Results for orders placed or performed during the hospital encounter of 11/25/22 (from the past 24 hour(s))  POCT urine pregnancy     Status: None   Collection Time: 11/25/22 10:15 AM  Result Value Ref Range   Preg Test, Ur Negative Negative    Assessment and Plan :   PDMP not reviewed this encounter.  1. Screen for STD (sexually transmitted disease)     Will treat as appropriate based off of lab results. UPT negative.    Wallis Bamberg, PA-C 11/25/22 1017

## 2022-11-25 NOTE — Discharge Instructions (Signed)
We will let you know about your test results tomorrow. You will likely see them first on mychart but if you have not heard from [place] by noon tomorrow, please call us and we will do a thorough review for your.

## 2022-11-26 LAB — HIV ANTIBODY (ROUTINE TESTING W REFLEX): HIV Screen 4th Generation wRfx: NONREACTIVE

## 2022-11-26 LAB — RPR: RPR Ser Ql: NONREACTIVE

## 2022-11-30 LAB — CERVICOVAGINAL ANCILLARY ONLY
Chlamydia: NEGATIVE
Comment: NEGATIVE
Comment: NEGATIVE
Comment: NORMAL
Neisseria Gonorrhea: NEGATIVE
Trichomonas: NEGATIVE

## 2023-02-04 DIAGNOSIS — E7800 Pure hypercholesterolemia, unspecified: Secondary | ICD-10-CM | POA: Diagnosis not present

## 2023-02-04 DIAGNOSIS — R7303 Prediabetes: Secondary | ICD-10-CM | POA: Diagnosis not present

## 2023-02-04 DIAGNOSIS — E559 Vitamin D deficiency, unspecified: Secondary | ICD-10-CM | POA: Diagnosis not present

## 2023-02-04 DIAGNOSIS — I10 Essential (primary) hypertension: Secondary | ICD-10-CM | POA: Diagnosis not present

## 2023-03-19 ENCOUNTER — Other Ambulatory Visit: Payer: Self-pay

## 2023-03-19 ENCOUNTER — Ambulatory Visit
Admission: EM | Admit: 2023-03-19 | Discharge: 2023-03-19 | Disposition: A | Discharge status: Home / Self Care | Payer: Medicaid Other | Attending: Family Medicine | Admitting: Family Medicine

## 2023-03-19 DIAGNOSIS — R82998 Other abnormal findings in urine: Secondary | ICD-10-CM

## 2023-03-19 DIAGNOSIS — Z113 Encounter for screening for infections with a predominantly sexual mode of transmission: Secondary | ICD-10-CM

## 2023-03-19 DIAGNOSIS — N760 Acute vaginitis: Secondary | ICD-10-CM

## 2023-03-19 LAB — POCT URINALYSIS DIP (MANUAL ENTRY)
Bilirubin, UA: NEGATIVE
Glucose, UA: NEGATIVE mg/dL
Ketones, POC UA: NEGATIVE mg/dL
Nitrite, UA: NEGATIVE
Protein Ur, POC: 30 mg/dL — AB
Spec Grav, UA: 1.025 (ref 1.010–1.025)
Urobilinogen, UA: 1 U/dL
pH, UA: 6.5 (ref 5.0–8.0)

## 2023-03-19 LAB — POCT URINE PREGNANCY: Preg Test, Ur: NEGATIVE

## 2023-03-19 MED ORDER — FLUCONAZOLE 150 MG PO TABS
150.0000 mg | ORAL_TABLET | Freq: Every day | ORAL | 0 refills | Status: AC
Start: 2023-03-19 — End: 2023-03-21

## 2023-03-19 NOTE — Discharge Instructions (Addendum)
The clinic will contact with results of the testing done today if positive.  Start Diflucan as prescribed.  PCP follow-up 2 to 3 days for recheck.  Please go to the ER for any worsening symptoms. I hope you feel better soon!

## 2023-03-19 NOTE — ED Provider Notes (Signed)
[facility]    CSN: [id number] Arrival date & time: 03/19/23  1313      History   Chief Complaint Chief Complaint  Patient presents with   Vaginal Itching    HPI Colleen Wells is a 38 y.o. female presents for evaluation of vaginitis.  Patient reports 4 to 5 days of vaginal itching.  She is on her menses so she is not sure if she has been having vaginal discharge.  She denies any dysuria, fevers, nausea/vomiting, flank pain.  No STD exposure but would like screening while here.  Reports history of BV and yeast infections and states this feels more consistent with yeast.  No OTC medications have been used since onset.  No other concerns at this time.   Vaginal Itching    Past Medical History:  Diagnosis Date   Arthrofibrosis of knee joint    right   GBS (group B Streptococcus carrier), +RV culture, currently pregnant 01/05/2022   Genetic carrier 01/16/2019   Mat 21-neg, Fragile X-neg, SMA-at risk to be a silent carrier; FOB, Antton Sharpe had SMA drawn 09/19/18 (MRN [id number]   Gestational diabetes    Meniscus tear    Prediabetes 12/26/2020   Pregnancy induced hypertension    Status post repeat low transverse cesarean section 11/29/2018   Normal posterior placenta    Patient Active Problem List   Diagnosis Date Noted   Postpartum care following cesarean delivery 02/09/2022   Gestational hypertension 02/09/2022   Contraceptive management 02/09/2022   Anxiety 11/05/2021   Diabetes mellitus in pregnancy 08/11/2021    Past Surgical History:  Procedure Laterality Date   ANTERIOR CRUCIATE LIGAMENT REPAIR Right 09/03/2015   Procedure: REPAIR ANTERIOR CRUCIATE LIGAMENT  REPAIR OF AVULSION;  Surgeon: Cammy Copa, MD;  Location: [facility];  Service: Orthopedics;  Laterality: Right;   CESAREAN SECTION     x4   CESAREAN SECTION N/A 01/29/2019   Procedure: CESAREAN SECTION;  Surgeon: Allie Bossier, MD;  Location: [facility];  Service: Obstetrics;  Laterality:  N/A;   CESAREAN SECTION N/A 01/08/2022   Procedure: CESAREAN SECTION;  Surgeon: Kathrynn Running, MD;  Location: [facility];  Service: Obstetrics;  Laterality: N/A;   KNEE CLOSED REDUCTION Right 10/08/2015   Procedure: CLOSED MANIPULATION KNEE UNDER ANESTHESIA;  Surgeon: Cammy Copa, MD;  Location: [facility] OR;  Service: Orthopedics;  Laterality: Right;   MENISCUS REPAIR Right 09/03/2015   Procedure: REPAIR OF MENISCUS LATERAL;  Surgeon: Cammy Copa, MD;  Location[facility] OR;  Service: Orthopedics;  Laterality: Right;    OB History     Gravida  7   Para  6   Term  6   Preterm      AB  1   Living  6      SAB  1   IAB      Ectopic      Multiple  0   Live Births  6            Home Medications    Prior to Admission medications   Medication Sig Start Date End Date Taking? Authorizing Provider  fluconazole (DIFLUCAN) 150 MG tablet Take 1 tablet (150 mg total) by mouth daily for 2 doses. 03/19/23 03/21/23 Yes Radford Pax, NP  losartan (COZAAR) 25 MG tablet Take 25 mg by mouth daily. 05/05/22   [provider]  metFORMIN (GLUCOPHAGE) 500 MG tablet Take 1 tablet (500 mg total) by mouth 2 (two) times daily  with a meal. Patient not taking: Reported on 08/12/2022 02/10/22   Hermina Staggers, MD  Vitamin D, Ergocalciferol, (DRISDOL) 1.25 MG (50000 UNIT) CAPS capsule Take 50,000 Units by mouth every 7 (seven) days. 07/15/22   [provider]    Family History Family History  Problem Relation Age of Onset   Cancer Mother    Healthy Mother    Healthy Father    Asthma Neg Hx    Birth defects Neg Hx    Diabetes Neg Hx    Heart disease Neg Hx    Hypertension Neg Hx     Social History Social History   Tobacco Use   Smoking status: Never   Smokeless tobacco: Never  Vaping Use   Vaping status: Never Used  Substance Use Topics   Alcohol use: No   Drug use: No     Allergies   Patient has no known allergies.   Review of Systems Review of  Systems  Genitourinary:        Vaginal itching     Physical Exam Triage Vital Signs ED Triage Vitals  Encounter Vitals Group     BP 03/19/23 1326 (!) 152/96     Systolic BP Percentile --      Diastolic BP Percentile --      Pulse Rate 03/19/23 1326 93     Resp 03/19/23 1326 18     Temp 03/19/23 1326 98.6 F (37 C)     Temp Source 03/19/23 1326 Oral     SpO2 03/19/23 1326 98 %     Weight --      Height --      Head Circumference --      Peak Flow --      Pain Score 03/19/23 1324 0     Pain Loc --      Pain Education --      Exclude from Growth Chart --    No data found.  Updated Vital Signs BP (!) 152/96 (BP Location: Right Arm)   Pulse 93   Temp 98.6 F (37 C) (Oral)   Resp 18   LMP 03/18/2023 (Exact Date)   SpO2 98%   Visual Acuity Right Eye Distance:   Left Eye Distance:   Bilateral Distance:    Right Eye Near:   Left Eye Near:    Bilateral Near:     Physical Exam Vitals and nursing note reviewed.  Constitutional:      Appearance: Normal appearance.  HENT:     Head: Normocephalic and atraumatic.  Eyes:     Pupils: Pupils are equal, round, and reactive to light.  Cardiovascular:     Rate and Rhythm: Normal rate.  Pulmonary:     Effort: Pulmonary effort is normal.  Abdominal:     Tenderness: There is no right CVA tenderness or left CVA tenderness.  Skin:    General: Skin is warm and dry.  Neurological:     General: No focal deficit present.     Mental Status: She is alert and oriented to person, place, and time.  Psychiatric:        Mood and Affect: Mood normal.        Behavior: Behavior normal.      UC Treatments / Results  Labs (all labs ordered are listed, but only abnormal results are displayed) Labs Reviewed  POCT URINALYSIS DIP (MANUAL ENTRY) - Abnormal; Notable for the following components:      Result Value   Clarity,  UA cloudy (*)    Blood, UA large (*)    Protein Ur, POC =30 (*)    Leukocytes, UA Trace (*)    All other  components within normal limits  URINE CULTURE  RPR  HIV ANTIBODY (ROUTINE TESTING W REFLEX)  POCT URINE PREGNANCY  CERVICOVAGINAL ANCILLARY ONLY    EKG   Radiology No results found.  Procedures Procedures (including critical care time)  Medications Ordered in UC Medications - No data to display  Initial Impression / Assessment and Plan / UC Course  I have reviewed the triage vital signs and the nursing notes.  Pertinent labs & imaging results that were available during my care of the patient were reviewed by me and considered in my medical decision making (see chart for details).     Reviewed exam and symptoms with patient.  No red flags.  UA with trace leuks and blood, however, patient is on her menses.  As she is asymptomatic will culture and contact for any positive results.  STD testing is ordered as well and will contact for any positive results.  Will start Diflucan to treat presumed yeast infection.  PCP or GYN follow-up if symptoms do not improve.  ER precautions reviewed. Final Clinical Impressions(s) / UC Diagnoses   Final diagnoses:  Leukocytes in urine  Acute vaginitis  Screening for STD (sexually transmitted disease)     Discharge Instructions      The clinic will contact with results of the testing done today if positive.  Start Diflucan as prescribed.  PCP follow-up 2 to 3 days for recheck.  Please go to the ER for any worsening symptoms. I hope you feel better soon!    ED Prescriptions     Medication Sig Dispense Auth. Provider   fluconazole (DIFLUCAN) 150 MG tablet Take 1 tablet (150 mg total) by mouth daily for 2 doses. 2 tablet Radford Pax, NP      PDMP not reviewed this encounter.   Radford Pax, NP 03/19/23 1355

## 2023-03-19 NOTE — ED Triage Notes (Signed)
Pt is here vaginal itching that started Monday, pt has not taken any meds to relieve discomfort.

## 2023-03-20 LAB — HIV ANTIBODY (ROUTINE TESTING W REFLEX): HIV Screen 4th Generation wRfx: NONREACTIVE

## 2023-03-20 LAB — RPR: RPR Ser Ql: NONREACTIVE

## 2023-03-21 LAB — URINE CULTURE: Culture: NO GROWTH

## 2023-03-22 ENCOUNTER — Telehealth (HOSPITAL_BASED_OUTPATIENT_CLINIC_OR_DEPARTMENT_OTHER): Payer: Self-pay

## 2023-03-22 LAB — CERVICOVAGINAL ANCILLARY ONLY
Bacterial Vaginitis (gardnerella): POSITIVE — AB
Candida Glabrata: NEGATIVE
Candida Vaginitis: NEGATIVE
Chlamydia: NEGATIVE
Comment: NEGATIVE
Comment: NEGATIVE
Comment: NEGATIVE
Comment: NEGATIVE
Comment: NEGATIVE
Comment: NORMAL
Neisseria Gonorrhea: NEGATIVE
Trichomonas: NEGATIVE

## 2023-03-22 MED ORDER — METRONIDAZOLE 500 MG PO TABS: 500.0000 mg | ORAL_TABLET | Freq: Two times a day (BID) | ORAL | 0 refills | Status: AC

## 2023-03-22 NOTE — Telephone Encounter (Signed)
Per protocol, pt requires tx with metronidazole. Rx sent to pharmacy on file.

## 2023-05-01 ENCOUNTER — Telehealth: Admitting: Nurse Practitioner

## 2023-05-01 DIAGNOSIS — B3731 Acute candidiasis of vulva and vagina: Secondary | ICD-10-CM

## 2023-05-01 MED ORDER — FLUCONAZOLE 150 MG PO TABS
150.0000 mg | ORAL_TABLET | ORAL | 0 refills | Status: DC | PRN
Start: 2023-05-01 — End: 2023-08-29

## 2023-05-01 NOTE — Progress Notes (Signed)

## 2023-05-01 NOTE — Progress Notes (Signed)
 I have spent 5 minutes in review of e-visit questionnaire, review and updating patient chart, medical decision making and response to patient.   Claiborne Rigg, NP

## 2023-05-25 ENCOUNTER — Ambulatory Visit
Admission: EM | Admit: 2023-05-25 | Discharge: 2023-05-25 | Disposition: A | Discharge status: Home / Self Care | Attending: Family Medicine | Admitting: Family Medicine

## 2023-05-25 DIAGNOSIS — N3001 Acute cystitis with hematuria: Secondary | ICD-10-CM | POA: Diagnosis not present

## 2023-05-25 DIAGNOSIS — N898 Other specified noninflammatory disorders of vagina: Secondary | ICD-10-CM | POA: Diagnosis not present

## 2023-05-25 LAB — POCT URINALYSIS DIP (MANUAL ENTRY)
Bilirubin, UA: NEGATIVE
Glucose, UA: NEGATIVE mg/dL
Ketones, POC UA: NEGATIVE mg/dL
Nitrite, UA: NEGATIVE
Protein Ur, POC: 30 mg/dL — AB
Spec Grav, UA: 1.025 (ref 1.010–1.025)
Urobilinogen, UA: 1 U/dL
pH, UA: 6 (ref 5.0–8.0)

## 2023-05-25 LAB — POCT URINE PREGNANCY: Preg Test, Ur: NEGATIVE

## 2023-05-25 MED ORDER — NITROFURANTOIN MONOHYD MACRO 100 MG PO CAPS
100.0000 mg | ORAL_CAPSULE | Freq: Two times a day (BID) | ORAL | 0 refills | Status: AC
Start: 2023-05-25 — End: 2023-06-01

## 2023-05-25 NOTE — ED Provider Notes (Signed)
 [facility]    CSN: [id number] Arrival date & time: 05/25/23  1149      History   Chief Complaint Chief Complaint  Patient presents with   Abdominal Pain    HPI Colleen Wells is a 38 y.o. female presents for lower abdominal pain.  Patient reports 3 to 4 days of a persistent suprapubic pressure/lower abdominal pain that does not radiate.  Denies any fevers, chills, nausea/vomiting/diarrhea.  No constipation.  No dysuria, frequency, urgency, hematuria.  Does endorse a vaginal odor but no discharge or STD concern, however, she would like screening..  Feels that the odor may be secondary to finishing her menstrual cycle.  Denies history of UTIs.  Eating and drinking normally.  She took some ibuprofen without improvement in her symptoms.  No other concerns at this time.   Abdominal Pain   Past Medical History:  Diagnosis Date   Arthrofibrosis of knee joint    right   GBS (group B Streptococcus carrier), +RV culture, currently pregnant 01/05/2022   Genetic carrier 01/16/2019   Mat 21-neg, Fragile X-neg, SMA-at risk to be a silent carrier; FOB, Antton Sharpe had SMA drawn 09/19/18 (MRN [id number]   Gestational diabetes    Meniscus tear    Prediabetes 12/26/2020   Pregnancy induced hypertension    Status post repeat low transverse cesarean section 11/29/2018   Normal posterior placenta    Patient Active Problem List   Diagnosis Date Noted   Postpartum care following cesarean delivery 02/09/2022   Gestational hypertension 02/09/2022   Contraceptive management 02/09/2022   Anxiety 11/05/2021   Diabetes mellitus in pregnancy 08/11/2021    Past Surgical History:  Procedure Laterality Date   ANTERIOR CRUCIATE LIGAMENT REPAIR Right 09/03/2015   Procedure: REPAIR ANTERIOR CRUCIATE LIGAMENT  REPAIR OF AVULSION;  Surgeon: Cammy Copa, MD;  Location: [facility];  Service: Orthopedics;  Laterality: Right;   CESAREAN SECTION     x4   CESAREAN SECTION N/A 01/29/2019    Procedure: CESAREAN SECTION;  Surgeon: Allie Bossier, MD;  Location: [facility];  Service: Obstetrics;  Laterality: N/A;   CESAREAN SECTION N/A 01/08/2022   Procedure: CESAREAN SECTION;  Surgeon: Kathrynn Running, MD;  Location: [facility];  Service: Obstetrics;  Laterality: N/A;   KNEE CLOSED REDUCTION Right 10/08/2015   Procedure: CLOSED MANIPULATION KNEE UNDER ANESTHESIA;  Surgeon: Cammy Copa, MD;  Location: [facility] OR;  Service: Orthopedics;  Laterality: Right;   MENISCUS REPAIR Right 09/03/2015   Procedure: REPAIR OF MENISCUS LATERAL;  Surgeon: Cammy Copa, MD;  Location: [facility] OR;  Service: Orthopedics;  Laterality: Right;    OB History     Gravida  7   Para  6   Term  6   Preterm      AB  1   Living  6      SAB  1   IAB      Ectopic      Multiple  0   Live Births  6            Home Medications    Prior to Admission medications   Medication Sig Start Date End Date Taking? Authorizing Provider  nitrofurantoin, macrocrystal-monohydrate, (MACROBID) 100 MG capsule Take 1 capsule (100 mg total) by mouth 2 (two) times daily for 7 days. 05/25/23 06/01/23 Yes Radford Pax, NP  fluconazole (DIFLUCAN) 150 MG tablet Take 1 tablet (150 mg total) by mouth every three (3) days as needed. 05/01/23  Claiborne Rigg, NP  losartan (COZAAR) 25 MG tablet Take 25 mg by mouth daily. 05/05/22   [provider]  metFORMIN (GLUCOPHAGE) 500 MG tablet Take 1 tablet (500 mg total) by mouth 2 (two) times daily with a meal. Patient not taking: Reported on 08/12/2022 02/10/22   Hermina Staggers, MD  Vitamin D, Ergocalciferol, (DRISDOL) 1.25 MG (50000 UNIT) CAPS capsule Take 50,000 Units by mouth every 7 (seven) days. 07/15/22   [provider]    Family History Family History  Problem Relation Age of Onset   Cancer Mother    Healthy Mother    Healthy Father    Asthma Neg Hx    Birth defects Neg Hx    Diabetes Neg Hx    Heart disease Neg Hx    Hypertension Neg Hx      Social History Social History   Tobacco Use   Smoking status: Never   Smokeless tobacco: Never  Vaping Use   Vaping status: Never Used  Substance Use Topics   Alcohol use: No   Drug use: No     Allergies   Patient has no known allergies.   Review of Systems Review of Systems  Gastrointestinal:  Positive for abdominal pain.     Physical Exam Triage Vital Signs ED Triage Vitals  Encounter Vitals Group     BP 05/25/23 1237 133/86     Systolic BP Percentile --      Diastolic BP Percentile --      Pulse Rate 05/25/23 1237 65     Resp 05/25/23 1237 16     Temp 05/25/23 1237 98.2 F (36.8 C)     Temp Source 05/25/23 1237 Oral     SpO2 05/25/23 1237 97 %     Weight --      Height --      Head Circumference --      Peak Flow --      Pain Score 05/25/23 1236 6     Pain Loc --      Pain Education --      Exclude from Growth Chart --    No data found.  Updated Vital Signs BP 133/86 (BP Location: Left Arm)   Pulse 65   Temp 98.2 F (36.8 C) (Oral)   Resp 16   LMP 05/15/2023 (Exact Date)   SpO2 97%   Breastfeeding No   Visual Acuity Right Eye Distance:   Left Eye Distance:   Bilateral Distance:    Right Eye Near:   Left Eye Near:    Bilateral Near:     Physical Exam Vitals and nursing note reviewed.  Constitutional:      Appearance: Normal appearance.  HENT:     Head: Normocephalic and atraumatic.  Eyes:     Pupils: Pupils are equal, round, and reactive to light.  Cardiovascular:     Rate and Rhythm: Normal rate.  Pulmonary:     Effort: Pulmonary effort is normal.  Abdominal:     General: Bowel sounds are normal.     Palpations: There is no hepatomegaly or splenomegaly.     Tenderness: There is abdominal tenderness in the suprapubic area. There is no right CVA tenderness or left CVA tenderness. Negative signs include Rovsing's sign and McBurney's sign.  Skin:    General: Skin is warm and dry.  Neurological:     General: No focal deficit  present.     Mental Status: She is alert and oriented to  person, place, and time.  Psychiatric:        Mood and Affect: Mood normal.        Behavior: Behavior normal.      UC Treatments / Results  Labs (all labs ordered are listed, but only abnormal results are displayed) Labs Reviewed  POCT URINALYSIS DIP (MANUAL ENTRY) - Abnormal; Notable for the following components:      Result Value   Blood, UA large (*)    Protein Ur, POC =30 (*)    Leukocytes, UA Small (1+) (*)    All other components within normal limits  URINE CULTURE  POCT URINE PREGNANCY  CERVICOVAGINAL ANCILLARY ONLY    EKG   Radiology No results found.  Procedures Procedures (including critical care time)  Medications Ordered in UC Medications - No data to display  Initial Impression / Assessment and Plan / UC Course  I have reviewed the triage vital signs and the nursing notes.  Pertinent labs & imaging results that were available during my care of the patient were reviewed by me and considered in my medical decision making (see chart for details).     Reviewed exam and symptoms with patient.  No red flags.  UA positive for UTI, will culture and start Macrobid.  Discussed abdominal pain secondary to UTI.  Vaginal swab/STD testing is ordered and will contact for any positive results.  Discussed rest fluids and PCP follow-up if symptoms do not improve.  ER precautions reviewed and patient verbalized understanding. Final Clinical Impressions(s) / UC Diagnoses   Final diagnoses:  Acute cystitis with hematuria  Vaginal odor     Discharge Instructions      The clinical contact you with results of the urine culture as well as vaginal swab done today if positive.  Start Macrobid twice daily for 7 days.  Lots of rest and fluids.  Please follow-up with your PCP if your symptoms do not improve.  Please go to the ER for any worsening symptoms.  Hope you feel better soon!   ED Prescriptions     Medication  Sig Dispense Auth. Provider   nitrofurantoin, macrocrystal-monohydrate, (MACROBID) 100 MG capsule Take 1 capsule (100 mg total) by mouth 2 (two) times daily for 7 days. 14 capsule Radford Pax, NP      PDMP not reviewed this encounter.   Radford Pax, NP 05/25/23 [phone]

## 2023-05-25 NOTE — Discharge Instructions (Addendum)
 The clinical contact you with results of the urine culture as well as vaginal swab done today if positive.  Start Macrobid twice daily for 7 days.  Lots of rest and fluids.  Please follow-up with your PCP if your symptoms do not improve.  Please go to the ER for any worsening symptoms.  Hope you feel better soon!

## 2023-05-25 NOTE — ED Triage Notes (Signed)
 Pt states lower abdominal pain for the past 3-4 days.  Pt denies any N/V/D states her last period was heavy and had a bad odor.

## 2023-05-26 ENCOUNTER — Telehealth (HOSPITAL_COMMUNITY): Payer: Self-pay

## 2023-05-26 LAB — URINE CULTURE: Culture: NO GROWTH

## 2023-05-26 LAB — CERVICOVAGINAL ANCILLARY ONLY
Bacterial Vaginitis (gardnerella): POSITIVE — AB
Candida Glabrata: NEGATIVE
Candida Vaginitis: NEGATIVE
Chlamydia: NEGATIVE
Comment: NEGATIVE
Comment: NEGATIVE
Comment: NEGATIVE
Comment: NEGATIVE
Comment: NEGATIVE
Comment: NORMAL
Neisseria Gonorrhea: NEGATIVE
Trichomonas: NEGATIVE

## 2023-05-26 MED ORDER — METRONIDAZOLE 500 MG PO TABS: 500.0000 mg | ORAL_TABLET | Freq: Two times a day (BID) | ORAL | 0 refills | Status: AC

## 2023-05-26 NOTE — Telephone Encounter (Signed)
 Per protocol, pt requires tx with metronidazole. Rx sent to pharmacy on file.

## 2023-06-03 DIAGNOSIS — R030 Elevated blood-pressure reading, without diagnosis of hypertension: Secondary | ICD-10-CM | POA: Diagnosis not present

## 2023-06-03 DIAGNOSIS — Z6834 Body mass index (BMI) 34.0-34.9, adult: Secondary | ICD-10-CM | POA: Diagnosis not present

## 2023-06-03 DIAGNOSIS — R0602 Shortness of breath: Secondary | ICD-10-CM | POA: Diagnosis not present

## 2023-06-03 DIAGNOSIS — R5383 Other fatigue: Secondary | ICD-10-CM | POA: Diagnosis not present

## 2023-06-03 DIAGNOSIS — Z0000 Encounter for general adult medical examination without abnormal findings: Secondary | ICD-10-CM | POA: Diagnosis not present

## 2023-06-03 DIAGNOSIS — E559 Vitamin D deficiency, unspecified: Secondary | ICD-10-CM | POA: Diagnosis not present

## 2023-06-17 DIAGNOSIS — G4730 Sleep apnea, unspecified: Secondary | ICD-10-CM | POA: Diagnosis not present

## 2023-06-17 DIAGNOSIS — I10 Essential (primary) hypertension: Secondary | ICD-10-CM | POA: Diagnosis not present

## 2023-06-17 DIAGNOSIS — E6609 Other obesity due to excess calories: Secondary | ICD-10-CM | POA: Diagnosis not present

## 2023-06-17 DIAGNOSIS — R030 Elevated blood-pressure reading, without diagnosis of hypertension: Secondary | ICD-10-CM | POA: Diagnosis not present

## 2023-06-17 DIAGNOSIS — R0602 Shortness of breath: Secondary | ICD-10-CM | POA: Diagnosis not present

## 2023-06-17 DIAGNOSIS — E559 Vitamin D deficiency, unspecified: Secondary | ICD-10-CM | POA: Diagnosis not present

## 2023-06-17 DIAGNOSIS — Z6834 Body mass index (BMI) 34.0-34.9, adult: Secondary | ICD-10-CM | POA: Diagnosis not present

## 2023-07-08 DIAGNOSIS — E6609 Other obesity due to excess calories: Secondary | ICD-10-CM | POA: Diagnosis not present

## 2023-07-08 DIAGNOSIS — Z6832 Body mass index (BMI) 32.0-32.9, adult: Secondary | ICD-10-CM | POA: Diagnosis not present

## 2023-07-08 DIAGNOSIS — E7800 Pure hypercholesterolemia, unspecified: Secondary | ICD-10-CM | POA: Diagnosis not present

## 2023-07-08 DIAGNOSIS — G4710 Hypersomnia, unspecified: Secondary | ICD-10-CM | POA: Diagnosis not present

## 2023-07-08 DIAGNOSIS — R7303 Prediabetes: Secondary | ICD-10-CM | POA: Diagnosis not present

## 2023-08-29 ENCOUNTER — Encounter (HOSPITAL_COMMUNITY): Payer: Self-pay

## 2023-08-29 ENCOUNTER — Ambulatory Visit (HOSPITAL_COMMUNITY)
Admission: EM | Admit: 2023-08-29 | Discharge: 2023-08-29 | Disposition: A | Discharge status: Home / Self Care | Attending: Emergency Medicine | Admitting: Emergency Medicine

## 2023-08-29 DIAGNOSIS — N898 Other specified noninflammatory disorders of vagina: Secondary | ICD-10-CM

## 2023-08-29 DIAGNOSIS — N760 Acute vaginitis: Secondary | ICD-10-CM | POA: Insufficient documentation

## 2023-08-29 DIAGNOSIS — Z3202 Encounter for pregnancy test, result negative: Secondary | ICD-10-CM | POA: Diagnosis not present

## 2023-08-29 DIAGNOSIS — Z113 Encounter for screening for infections with a predominantly sexual mode of transmission: Secondary | ICD-10-CM

## 2023-08-29 LAB — HIV ANTIBODY (ROUTINE TESTING W REFLEX): HIV Screen 4th Generation wRfx: NONREACTIVE

## 2023-08-29 LAB — POCT URINE PREGNANCY: Preg Test, Ur: NEGATIVE

## 2023-08-29 MED ORDER — METRONIDAZOLE 500 MG PO TABS: 500.0000 mg | ORAL_TABLET | Freq: Two times a day (BID) | ORAL | 0 refills | Status: DC

## 2023-08-29 NOTE — ED Provider Notes (Signed)
 [facility]    CSN: [id number] Arrival date & time: 08/29/23  1324      History   Chief Complaint Chief Complaint  Patient presents with   Vaginal Discharge    HPI Colleen Wells is a 38 y.o. female.   Patient presents with malodorous vaginal discharge, and lower abdominal cramping for the last week.  Patient states that she has a history of recurrent bacterial vaginosis and states that her symptoms are similar to when she has had this in the past.  Patient is requesting STD testing.  Patient denies any known exposures to STDs.  LMP 6/12.  Patient does report history of irregular menstrual cycles.  Denies dysuria, hematuria, urinary frequency/urgency, abnormal vaginal bleeding, flank pain, fever, nausea, and vomiting.  The history is provided by the patient and medical records.  Vaginal Discharge   Past Medical History:  Diagnosis Date   Arthrofibrosis of knee joint    right   GBS (group B Streptococcus carrier), +RV culture, currently pregnant 01/05/2022   Genetic carrier 01/16/2019   Mat 21-neg, Fragile X-neg, SMA-at risk to be a silent carrier; FOB, Antton Sharpe had SMA drawn 09/19/18 (MRN [id number])-wzh   Gestational diabetes    Meniscus tear    Prediabetes 12/26/2020   Pregnancy induced hypertension    Status post repeat low transverse cesarean section 11/29/2018   Normal posterior placenta    Patient Active Problem List   Diagnosis Date Noted   Postpartum care following cesarean delivery 02/09/2022   Gestational hypertension 02/09/2022   Contraceptive management 02/09/2022   Anxiety 11/05/2021   Diabetes mellitus in pregnancy 08/11/2021    Past Surgical History:  Procedure Laterality Date   ANTERIOR CRUCIATE LIGAMENT REPAIR Right 09/03/2015   Procedure: REPAIR ANTERIOR CRUCIATE LIGAMENT  REPAIR OF AVULSION;  Surgeon: Glendia Cordella Hutchinson, MD;  Location: [facility];  Service: Orthopedics;  Laterality: Right;   CESAREAN SECTION     x4   CESAREAN  SECTION N/A 01/29/2019   Procedure: CESAREAN SECTION;  Surgeon: Starla Harland BROCKS, MD;  Location: [facility];  Service: Obstetrics;  Laterality: N/A;   CESAREAN SECTION N/A 01/08/2022   Procedure: CESAREAN SECTION;  Surgeon: Kandis Devaughn Sayres, MD;  [facility]: [facility];  Service: Obstetrics;  Laterality: N/A;   KNEE CLOSED REDUCTION Right 10/08/2015   Procedure: CLOSED MANIPULATION KNEE UNDER ANESTHESIA;  Surgeon: Glendia Cordella Hutchinson, MD;  Location: [facility];  Service: Orthopedics;  Laterality: Right;   MENISCUS REPAIR Right 09/03/2015   Procedure: REPAIR OF MENISCUS LATERAL;  Surgeon: Glendia Cordella Hutchinson, MD;  [facility];  Service: Orthopedics;  Laterality: Right;    OB History     Gravida  7   Para  6   Term  6   Preterm      AB  1   Living  6      SAB  1   IAB      Ectopic      Multiple  0   Live Births  6            Home Medications    Prior to Admission medications   Medication Sig Start Date End Date Taking? Authorizing Provider  losartan-hydrochlorothiazide (HYZAAR) 50-12.5 MG tablet Take 1 tablet by mouth daily. 06/03/23  Yes [provider]  metroNIDAZOLE  (FLAGYL ) 500 MG tablet Take 1 tablet (500 mg total) by mouth 2 (two) times daily. 08/29/23  Yes Johnie Flaming A, NP  Vitamin D, Ergocalciferol, (DRISDOL) 1.25 MG (50000  UNIT) CAPS capsule Take 50,000 Units by mouth every 7 (seven) days. 07/15/22  Yes [provider]    Family History Family History  Problem Relation Age of Onset   Cancer Mother    Healthy Mother    Healthy Father    Asthma Neg Hx    Birth defects Neg Hx    Diabetes Neg Hx    Heart disease Neg Hx    Hypertension Neg Hx     Social History Social History   Tobacco Use   Smoking status: Never   Smokeless tobacco: Never  Vaping Use   Vaping status: Never Used  Substance Use Topics   Alcohol use: No   Drug use: No     Allergies   Patient has no known allergies.   Review of Systems Review of Systems   Genitourinary:  Positive for vaginal discharge.   Per HPI  Physical Exam Triage Vital Signs ED Triage Vitals  Encounter Vitals Group     BP 08/29/23 1356 (!) 148/93     Girls Systolic BP Percentile --      Girls Diastolic BP Percentile --      Boys Systolic BP Percentile --      Boys Diastolic BP Percentile --      Pulse Rate 08/29/23 1356 78     Resp 08/29/23 1356 18     Temp 08/29/23 1356 98.3 F (36.8 C)     Temp Source 08/29/23 1356 Oral     SpO2 08/29/23 1356 98 %     Weight 08/29/23 1356 214 lb (97.1 kg)     Height 08/29/23 1356 5' 8 (1.727 m)     Head Circumference --      Peak Flow --      Pain Score 08/29/23 1354 0     Pain Loc --      Pain Education --      Exclude from Growth Chart --    No data found.  Updated Vital Signs BP (!) 148/93 (BP Location: Right Arm)   Pulse 78   Temp 98.3 F (36.8 C) (Oral)   Resp 18   Ht 5' 8 (1.727 m)   Wt 214 lb (97.1 kg)   LMP 08/05/2023 (Exact Date)   SpO2 98%   Breastfeeding No   BMI 32.54 kg/m   Visual Acuity Right Eye Distance:   Left Eye Distance:   Bilateral Distance:    Right Eye Near:   Left Eye Near:    Bilateral Near:     Physical Exam Vitals and nursing note reviewed.  Constitutional:      General: She is awake. She is not in acute distress.    Appearance: Normal appearance. She is well-developed and well-groomed. She is not ill-appearing.  Genitourinary:    Comments: Exam deferred Neurological:     Mental Status: She is alert.  Psychiatric:        Behavior: Behavior is cooperative.      UC Treatments / Results  Labs (all labs ordered are listed, but only abnormal results are displayed) Labs Reviewed  HIV ANTIBODY (ROUTINE TESTING W REFLEX)  RPR  POCT URINE PREGNANCY  CERVICOVAGINAL ANCILLARY ONLY    EKG   Radiology No results found.  Procedures Procedures (including critical care time)  Medications Ordered in UC Medications - No data to display  Initial Impression /  Assessment and Plan / UC Course  I have reviewed the triage vital signs and the nursing notes.  Pertinent  labs & imaging results that were available during my care of the patient were reviewed by me and considered in my medical decision making (see chart for details).     Patient is overall well-appearing.  Vitals are stable.  GU exam deferred.  Patient form self swab for STD/STI.  HIV and RPR ordered.  Urine pregnancy negative.  Empirically treating with metronidazole  for bacterial vaginosis.  Discussed follow-up and return precautions. Final Clinical Impressions(s) / UC Diagnoses   Final diagnoses:  Acute vaginitis  Vaginal discharge  Screening examination for STD (sexually transmitted disease)     Discharge Instructions      Start taking metronidazole  twice daily for 7 for bacterial vaginosis coverage. Your results will come back over the next few days and someone will call if results are positive and require any additional treatment.  Follow-up with primary care provider or return here as needed. [facility] also [facility] for further evaluation of recurrent bacterial vaginosis.      ED Prescriptions     Medication Sig Dispense Auth. Provider   metroNIDAZOLE  (FLAGYL ) 500 MG tablet Take 1 tablet (500 mg total) by mouth 2 (two) times daily. 14 tablet Johnie Flaming A, NP      PDMP not reviewed this encounter.   Johnie Flaming A, NP 08/29/23 [phone]

## 2023-08-29 NOTE — ED Triage Notes (Signed)
 Patient is prone to BV. Currently having vaginal discharge, odor, and lower abdominal pain for the last week. Requesting full std screening as well.

## 2023-08-29 NOTE — Discharge Instructions (Addendum)
 Start taking metronidazole  twice daily for 7 for bacterial vaginosis coverage. Your results will come back over the next few days and someone will call if results are positive and require any additional treatment.  Follow-up with primary care provider or return here as needed. You can also follow-up with the [facility] for further evaluation of recurrent bacterial vaginosis.

## 2023-08-30 LAB — CERVICOVAGINAL ANCILLARY ONLY
Bacterial Vaginitis (gardnerella): POSITIVE — AB
Candida Glabrata: NEGATIVE
Candida Vaginitis: NEGATIVE
Chlamydia: NEGATIVE
Comment: NEGATIVE
Comment: NEGATIVE
Comment: NEGATIVE
Comment: NEGATIVE
Comment: NEGATIVE
Comment: NORMAL
Neisseria Gonorrhea: NEGATIVE
Trichomonas: NEGATIVE

## 2023-08-30 LAB — RPR: RPR Ser Ql: NONREACTIVE

## 2023-08-31 ENCOUNTER — Ambulatory Visit (HOSPITAL_COMMUNITY): Payer: Self-pay

## 2023-09-20 ENCOUNTER — Telehealth: Admitting: Physician Assistant

## 2023-09-20 DIAGNOSIS — B9689 Other specified bacterial agents as the cause of diseases classified elsewhere: Secondary | ICD-10-CM

## 2023-09-20 DIAGNOSIS — N760 Acute vaginitis: Secondary | ICD-10-CM

## 2023-09-20 MED ORDER — METRONIDAZOLE 500 MG PO TABS
500.0000 mg | ORAL_TABLET | Freq: Two times a day (BID) | ORAL | 0 refills | Status: AC
Start: 2023-09-20 — End: 2023-09-27

## 2023-09-20 NOTE — Progress Notes (Signed)

## 2023-10-04 ENCOUNTER — Telehealth: Admitting: Physician Assistant

## 2023-10-04 DIAGNOSIS — T3695XA Adverse effect of unspecified systemic antibiotic, initial encounter: Secondary | ICD-10-CM | POA: Diagnosis not present

## 2023-10-04 DIAGNOSIS — B379 Candidiasis, unspecified: Secondary | ICD-10-CM | POA: Diagnosis not present

## 2023-10-04 MED ORDER — FLUCONAZOLE 150 MG PO TABS
150.0000 mg | ORAL_TABLET | ORAL | 0 refills | Status: AC | PRN
Start: 2023-10-04 — End: ?

## 2023-10-04 NOTE — Progress Notes (Signed)

## 2023-11-01 ENCOUNTER — Ambulatory Visit
Admission: EM | Admit: 2023-11-01 | Discharge: 2023-11-01 | Disposition: A | Discharge status: Home / Self Care | Attending: Family Medicine | Admitting: Family Medicine

## 2023-11-01 DIAGNOSIS — Z113 Encounter for screening for infections with a predominantly sexual mode of transmission: Secondary | ICD-10-CM | POA: Diagnosis not present

## 2023-11-01 NOTE — ED Provider Notes (Signed)
 [facility]  Note:  This document was prepared using Conservation officer, historic buildings and may include unintentional dictation errors.  MRN: [id number] DOB: Sep 03, 1985  Subjective:   Colleen Wells is a 38 y.o. female presenting for STI screening. Denies fever, n/v, abdominal pain, pelvic pain, rashes, dysuria, urinary frequency, hematuria, vaginal discharge.  No known exposures.  Had HIV and syphilis testing done 2 months ago.    No current facility-administered medications for this encounter.  Current Outpatient Medications:    fluconazole  (DIFLUCAN ) 150 MG tablet, Take 1 tablet (150 mg total) by mouth every 3 (three) days as needed., Disp: 2 tablet, Rfl: 0   losartan-hydrochlorothiazide (HYZAAR) 50-12.5 MG tablet, Take 1 tablet by mouth daily., Disp: , Rfl:    Vitamin D, Ergocalciferol, (DRISDOL) 1.25 MG (50000 UNIT) CAPS capsule, Take 50,000 Units by mouth every 7 (seven) days., Disp: , Rfl:    No Known Allergies  Past Medical History:  Diagnosis Date   Arthrofibrosis of knee joint    right   GBS (group B Streptococcus carrier), +RV culture, currently pregnant 01/05/2022   Genetic carrier 01/16/2019   Mat 21-neg, Fragile X-neg, SMA-at risk to be a silent carrier; FOB, Antton Sharpe had SMA drawn 09/19/18 (MRN [id number])-wzh   Gestational diabetes    Meniscus tear    Prediabetes 12/26/2020   Pregnancy induced hypertension    Status post repeat low transverse cesarean section 11/29/2018   Normal posterior placenta     Past Surgical History:  Procedure Laterality Date   ANTERIOR CRUCIATE LIGAMENT REPAIR Right 09/03/2015   Procedure: REPAIR ANTERIOR CRUCIATE LIGAMENT  REPAIR OF AVULSION;  Surgeon: Glendia Cordella Hutchinson, MD;  [facility];  Service: Orthopedics;  Laterality: Right;   CESAREAN SECTION     x4   CESAREAN SECTION N/A 01/29/2019   Procedure: CESAREAN SECTION;  Surgeon: Starla Harland BROCKS, MD;  [facility];  Service: Obstetrics;   Laterality: N/A;   CESAREAN SECTION N/A 01/08/2022   Procedure: CESAREAN SECTION;  Surgeon: Kandis Devaughn Sayres, MD;  [facility];  Service: Obstetrics;  Laterality: N/A;   KNEE CLOSED REDUCTION Right 10/08/2015   Procedure: CLOSED MANIPULATION KNEE UNDER ANESTHESIA;  Surgeon: Glendia Cordella Hutchinson, MD;  [facility];  Service: Orthopedics;  Laterality: Right;   MENISCUS REPAIR Right 09/03/2015   Procedure: REPAIR OF MENISCUS LATERAL;  Surgeon: Glendia Cordella Hutchinson, MD;  Location: [facility] OR;  Service: Orthopedics;  Laterality: Right;    Family History  Problem Relation Age of Onset   Cancer Mother    Healthy Mother    Healthy Father    Asthma Neg Hx    Birth defects Neg Hx    Diabetes Neg Hx    Heart disease Neg Hx    Hypertension Neg Hx     Social History   Tobacco Use   Smoking status: Never   Smokeless tobacco: Never  Vaping Use   Vaping status: Never Used  Substance Use Topics   Alcohol use: No   Drug use: No    ROS   Objective:   Vitals: BP (!) 153/93 (BP Location: Right Arm)   Pulse 68   Temp 98.2 F (36.8 C) (Oral)   Resp 16   LMP 10/06/2023   SpO2 98%   Physical Exam Constitutional:      General: She is not in acute distress.    Appearance: Normal appearance. She is well-developed. She is not ill-appearing, toxic-appearing or diaphoretic.  HENT:  Head: Normocephalic and atraumatic.     Nose: Nose normal.     Mouth/Throat:     Mouth: Mucous membranes are moist.  Eyes:     General: No scleral icterus.       Right eye: No discharge.        Left eye: No discharge.     Extraocular Movements: Extraocular movements intact.  Cardiovascular:     Rate and Rhythm: Normal rate.  Pulmonary:     Effort: Pulmonary effort is normal.  Skin:    General: Skin is warm and dry.  Neurological:     General: No focal deficit present.     Mental Status: She is alert and oriented to person, place, and time.  Psychiatric:        Mood and Affect: Mood normal.         Behavior: Behavior normal.     Assessment and Plan :   PDMP not reviewed this encounter.  1. Screening examination for STD (sexually transmitted disease)    Will defer blood work given her recent testing.  Vaginal cytology pending, will treat as appropriate based off of her results.   Christopher Savannah, PA-C 11/01/23 1058

## 2023-11-01 NOTE — Discharge Instructions (Addendum)
 Will let you know about the results from the vaginal swab tomorrow and treat any infections you test positive for.

## 2023-11-01 NOTE — ED Triage Notes (Signed)
 Pt requesting STD testing-denies sx and known exposure-NAD-steady gait

## 2023-11-02 ENCOUNTER — Ambulatory Visit
Admission: EM | Admit: 2023-11-02 | Discharge: 2023-11-02 | Disposition: A | Discharge status: Home / Self Care | Source: Ambulatory Visit | Attending: Urgent Care | Admitting: Urgent Care

## 2023-11-02 ENCOUNTER — Ambulatory Visit (HOSPITAL_COMMUNITY): Payer: Self-pay

## 2023-11-02 DIAGNOSIS — A549 Gonococcal infection, unspecified: Secondary | ICD-10-CM

## 2023-11-02 LAB — CERVICOVAGINAL ANCILLARY ONLY
Chlamydia: NEGATIVE
Comment: NEGATIVE
Comment: NEGATIVE
Comment: NORMAL
Neisseria Gonorrhea: POSITIVE — AB
Trichomonas: NEGATIVE

## 2023-11-02 MED ORDER — CEFTRIAXONE SODIUM 500 MG IJ SOLR
500.0000 mg | Freq: Once | INTRAMUSCULAR | Status: AC
  Administered 2023-11-02: 500 mg via INTRAMUSCULAR

## 2023-11-02 NOTE — ED Triage Notes (Signed)
 Pt presents for Rocephin  500 mg to treat gonorrhea.

## 2023-11-21 ENCOUNTER — Telehealth

## 2023-11-21 DIAGNOSIS — Z8619 Personal history of other infectious and parasitic diseases: Secondary | ICD-10-CM

## 2023-11-21 DIAGNOSIS — N898 Other specified noninflammatory disorders of vagina: Secondary | ICD-10-CM

## 2023-11-21 NOTE — Progress Notes (Signed)
  Because of your recurrent discharge and you were recently treated for gonorrhea, I feel your condition warrants further evaluation and I recommend that you be seen in a face-to-face visit for further testing.    NOTE: There will be NO CHARGE for this E-Visit   If you are having a true medical emergency, please call 911.     For an urgent face to face visit, [facility] has multiple urgent care centers for your convenience.  Click the link below for the full list of locations and hours, walk-in wait times, appointment scheduling options and driving directions:  Urgent Care - [place], [place], [place], [place], [place], [place]  [place]     Your MyChart E-visit questionnaire answers were reviewed by a board certified advanced clinical practitioner to complete your personal care plan based on your specific symptoms.    Thank you for using e-Visits.

## 2023-11-30 ENCOUNTER — Telehealth

## 2023-11-30 ENCOUNTER — Ambulatory Visit (HOSPITAL_COMMUNITY)
Admission: EM | Admit: 2023-11-30 | Discharge: 2023-11-30 | Disposition: A | Discharge status: Home / Self Care | Attending: Emergency Medicine | Admitting: Emergency Medicine

## 2023-11-30 ENCOUNTER — Encounter (HOSPITAL_COMMUNITY): Payer: Self-pay | Admitting: *Deleted

## 2023-11-30 DIAGNOSIS — Z202 Contact with and (suspected) exposure to infections with a predominantly sexual mode of transmission: Secondary | ICD-10-CM | POA: Insufficient documentation

## 2023-11-30 MED ORDER — METRONIDAZOLE 500 MG PO TABS: 500.0000 mg | ORAL_TABLET | Freq: Two times a day (BID) | ORAL | 0 refills | Status: AC

## 2023-11-30 MED ORDER — METRONIDAZOLE 500 MG PO TABS: 500.0000 mg | ORAL_TABLET | Freq: Two times a day (BID) | ORAL | 0 refills | Status: DC

## 2023-11-30 NOTE — Discharge Instructions (Signed)
 Start taking metronidazole  twice daily for 7 days for trichomonas exposure. Your results will return over the next few days and we will call if results are positive or require any additional treatment. Follow-up with your primary care provider or return here as needed.

## 2023-11-30 NOTE — ED Provider Notes (Signed)
 [facility]    CSN: [id number] Arrival date & time: 11/30/23  1826      History   Chief Complaint Chief Complaint  Patient presents with   SEXUALLY TRANSMITTED DISEASE    HPI Colleen Wells is a 38 y.o. female.   Patient presents with recent exposure to trichomonas.  Patient states that she previously was diagnosed with gonorrhea and received treatment for this and then had retesting with her primary care provider a week ago and these results came back today and were all negative.  Patient states that within the last week she has had recent exposure to someone who tested positive for trichomonas and is requesting testing and treatment for this as well.  Patient denies any abnormal vaginal discharge, vaginal odor, vaginal pain, vaginal itching, vaginal lesions, abdominal pain, dysuria, hematuria, urinary frequency/urgency, or fever.  The history is provided by the patient and medical records.    Past Medical History:  Diagnosis Date   Arthrofibrosis of knee joint    right   GBS (group B Streptococcus carrier), +RV culture, currently pregnant 01/05/2022   Genetic carrier 01/16/2019   Mat 21-neg, Fragile X-neg, SMA-at risk to be a silent carrier; FOB, Antton Sharpe had SMA drawn 09/19/18 (MRN [id number])-wzh   Gestational diabetes    Meniscus tear    Prediabetes 12/26/2020   Pregnancy induced hypertension    Status post repeat low transverse cesarean section 11/29/2018   Normal posterior placenta    Patient Active Problem List   Diagnosis Date Noted   Postpartum care following cesarean delivery 02/09/2022   Gestational hypertension 02/09/2022   Contraceptive management 02/09/2022   Anxiety 11/05/2021   Diabetes mellitus in pregnancy 08/11/2021    Past Surgical History:  Procedure Laterality Date   ANTERIOR CRUCIATE LIGAMENT REPAIR Right 09/03/2015   Procedure: REPAIR ANTERIOR CRUCIATE LIGAMENT  REPAIR OF AVULSION;  Surgeon: Glendia Cordella Hutchinson, MD;  [facility];  Service: Orthopedics;  Laterality: Right;   CESAREAN SECTION     x4   CESAREAN SECTION N/A 01/29/2019   Procedure: CESAREAN SECTION;  Surgeon: Starla Harland BROCKS, MD;  [facility];  Service: Obstetrics;  Laterality: N/A;   CESAREAN SECTION N/A 01/08/2022   Procedure: CESAREAN SECTION;  Surgeon: Kandis Devaughn Sayres, MD;  [facility];  Service: Obstetrics;  Laterality: N/A;   KNEE CLOSED REDUCTION Right 10/08/2015   Procedure: CLOSED MANIPULATION KNEE UNDER ANESTHESIA;  Surgeon: Glendia Cordella Hutchinson, MD;  [facility];  Service: Orthopedics;  Laterality: Right;   MENISCUS REPAIR Right 09/03/2015   Procedure: REPAIR OF MENISCUS LATERAL;  Surgeon: Glendia Cordella Hutchinson, MD;  Location: [facility] OR;  Service: Orthopedics;  Laterality: Right;    OB History     Gravida  7   Para  6   Term  6   Preterm      AB  1   Living  6      SAB  1   IAB      Ectopic      Multiple  0   Live Births  6            Home Medications    Prior to Admission medications   Medication Sig Start Date End Date Taking? Authorizing Provider  metroNIDAZOLE  (FLAGYL ) 500 MG tablet Take 1 tablet (500 mg total) by mouth 2 (two) times daily. 11/30/23  Yes Johnie, Genella Bas A, NP  Vitamin D, Ergocalciferol, (DRISDOL) 1.25 MG (50000 UNIT) CAPS capsule Take 50,000  Units by mouth every 7 (seven) days. 07/15/22  Yes [provider]  fluconazole  (DIFLUCAN ) 150 MG tablet Take 1 tablet (150 mg total) by mouth every 3 (three) days as needed. 10/04/23   Vivienne Delon HERO, PA-C  losartan-hydrochlorothiazide (HYZAAR) 50-12.5 MG tablet Take 1 tablet by mouth daily. 06/03/23   [provider]    Family History Family History  Problem Relation Age of Onset   Cancer Mother    Healthy Mother    Healthy Father    Asthma Neg Hx    Birth defects Neg Hx    Diabetes Neg Hx    Heart disease Neg Hx    Hypertension Neg Hx     Social History Social History   Tobacco Use   Smoking status:  Never   Smokeless tobacco: Never  Vaping Use   Vaping status: Never Used  Substance Use Topics   Alcohol use: No   Drug use: No     Allergies   Patient has no known allergies.   Review of Systems Review of Systems  Per HPI  Physical Exam Triage Vital Signs ED Triage Vitals  Encounter Vitals Group     BP 11/30/23 1937 (!) 159/101     Girls Systolic BP Percentile --      Girls Diastolic BP Percentile --      Boys Systolic BP Percentile --      Boys Diastolic BP Percentile --      Pulse Rate 11/30/23 1937 65     Resp 11/30/23 1937 18     Temp 11/30/23 1937 98.2 F (36.8 C)     Temp Source 11/30/23 1937 Oral     SpO2 11/30/23 1937 100 %     Weight --      Height --      Head Circumference --      Peak Flow --      Pain Score 11/30/23 1936 0     Pain Loc --      Pain Education --      Exclude from Growth Chart --    No data found.  Updated Vital Signs BP (!) 159/101 (BP Location: Right Arm)   Pulse 65   Temp 98.2 F (36.8 C) (Oral)   Resp 18   LMP 11/29/2023 (Exact Date)   SpO2 100%   Visual Acuity Right Eye Distance:   Left Eye Distance:   Bilateral Distance:    Right Eye Near:   Left Eye Near:    Bilateral Near:     Physical Exam Vitals and nursing note reviewed.  Constitutional:      General: She is awake. She is not in acute distress.    Appearance: Normal appearance. She is well-developed and well-groomed. She is not ill-appearing.  Abdominal:     General: Abdomen is flat. Bowel sounds are normal. There is no distension.     Palpations: Abdomen is soft.     Tenderness: There is no abdominal tenderness. There is no right CVA tenderness, left CVA tenderness, guarding or rebound.  Genitourinary:    Comments: Exam deferred Skin:    General: Skin is warm and dry.  Neurological:     Mental Status: She is alert.  Psychiatric:        Behavior: Behavior is cooperative.      UC Treatments / Results  Labs (all labs ordered are listed, but  only abnormal results are displayed) Labs Reviewed  CERVICOVAGINAL ANCILLARY ONLY    EKG  Radiology No results found.  Procedures Procedures (including critical care time)  Medications Ordered in UC Medications - No data to display  Initial Impression / Assessment and Plan / UC Course  I have reviewed the triage vital signs and the nursing notes.  Pertinent labs & imaging results that were available during my care of the patient were reviewed by me and considered in my medical decision making (see chart for details).     Patient is overall well-appearing.  Vitals are stable.  Prescribed metronidazole  for trichomonas exposure.  GU exam deferred.  Patient perform self swab for STD.  HIV and RPR declined.  Discussed follow-up and return precautions. Final Clinical Impressions(s) / UC Diagnoses   Final diagnoses:  Exposure to trichomonas     Discharge Instructions      Start taking metronidazole  twice daily for 7 days for trichomonas exposure. Your results will return over the next few days and we will call if results are positive or require any additional treatment. Follow-up with your primary care provider or return here as needed.     ED Prescriptions     Medication Sig Dispense Auth. Provider   metroNIDAZOLE  (FLAGYL ) 500 MG tablet  (Status: Discontinued) Take 1 tablet (500 mg total) by mouth 2 (two) times daily. 14 tablet Johnie, Tammye Kahler A, NP   metroNIDAZOLE  (FLAGYL ) 500 MG tablet Take 1 tablet (500 mg total) by mouth 2 (two) times daily. 14 tablet Johnie Flaming A, NP      PDMP not reviewed this encounter.   Johnie Flaming A, NP 11/30/23 [phone]

## 2023-11-30 NOTE — ED Triage Notes (Signed)
 Pt states she was tested for STI by her pcp last Thursday and her results were neg. He boyfriend was retested and he is positive for trich. She isnt sure if she has a new exposure since he tested positive.

## 2023-12-01 LAB — CERVICOVAGINAL ANCILLARY ONLY
Bacterial Vaginitis (gardnerella): NEGATIVE
Candida Glabrata: NEGATIVE
Candida Vaginitis: NEGATIVE
Chlamydia: NEGATIVE
Comment: NEGATIVE
Comment: NEGATIVE
Comment: NEGATIVE
Comment: NEGATIVE
Comment: NEGATIVE
Comment: NORMAL
Neisseria Gonorrhea: NEGATIVE
Trichomonas: NEGATIVE
